# Patient Record
Sex: Male | Born: 1935 | Race: White | Hispanic: No | Marital: Married | Smoking: Former smoker
Health system: Southern US, Community
[De-identification: ages and names within clinical notes are randomized; demographics above are authoritative.]

## PROBLEM LIST (undated history)

## (undated) DIAGNOSIS — I4892 Unspecified atrial flutter: Secondary | ICD-10-CM

## (undated) DIAGNOSIS — F32A Depression, unspecified: Secondary | ICD-10-CM

## (undated) DIAGNOSIS — G250 Essential tremor: Secondary | ICD-10-CM

## (undated) DIAGNOSIS — I2510 Atherosclerotic heart disease of native coronary artery without angina pectoris: Secondary | ICD-10-CM

## (undated) DIAGNOSIS — F329 Major depressive disorder, single episode, unspecified: Secondary | ICD-10-CM

## (undated) DIAGNOSIS — R609 Edema, unspecified: Secondary | ICD-10-CM

## (undated) DIAGNOSIS — I10 Essential (primary) hypertension: Secondary | ICD-10-CM

## (undated) DIAGNOSIS — N400 Enlarged prostate without lower urinary tract symptoms: Secondary | ICD-10-CM

## (undated) DIAGNOSIS — H9193 Unspecified hearing loss, bilateral: Secondary | ICD-10-CM

## (undated) DIAGNOSIS — Z960 Presence of urogenital implants: Secondary | ICD-10-CM

## (undated) DIAGNOSIS — IMO0001 Reserved for inherently not codable concepts without codable children: Secondary | ICD-10-CM

## (undated) DIAGNOSIS — E785 Hyperlipidemia, unspecified: Secondary | ICD-10-CM

## (undated) DIAGNOSIS — I8390 Asymptomatic varicose veins of unspecified lower extremity: Secondary | ICD-10-CM

## (undated) DIAGNOSIS — R55 Syncope and collapse: Secondary | ICD-10-CM

## (undated) DIAGNOSIS — K219 Gastro-esophageal reflux disease without esophagitis: Secondary | ICD-10-CM

## (undated) DIAGNOSIS — I509 Heart failure, unspecified: Secondary | ICD-10-CM

## (undated) DIAGNOSIS — R339 Retention of urine, unspecified: Secondary | ICD-10-CM

## (undated) DIAGNOSIS — Z7901 Long term (current) use of anticoagulants: Secondary | ICD-10-CM

## (undated) DIAGNOSIS — G629 Polyneuropathy, unspecified: Secondary | ICD-10-CM

## (undated) DIAGNOSIS — I350 Nonrheumatic aortic (valve) stenosis: Secondary | ICD-10-CM

## (undated) DIAGNOSIS — H547 Unspecified visual loss: Secondary | ICD-10-CM

## (undated) HISTORY — DX: Heart failure, unspecified: I50.9

## (undated) HISTORY — DX: Gastro-esophageal reflux disease without esophagitis: K21.9

## (undated) HISTORY — DX: Edema, unspecified: R60.9

## (undated) HISTORY — PX: LUNG SURGERY: SHX703

## (undated) HISTORY — DX: Reserved for inherently not codable concepts without codable children: IMO0001

## (undated) HISTORY — DX: Polyneuropathy, unspecified: G62.9

## (undated) HISTORY — DX: Asymptomatic varicose veins of unspecified lower extremity: I83.90

## (undated) HISTORY — DX: Unspecified visual loss: H54.7

## (undated) HISTORY — DX: Long term (current) use of anticoagulants: Z79.01

## (undated) HISTORY — DX: Benign prostatic hyperplasia without lower urinary tract symptoms: N40.0

## (undated) HISTORY — DX: Essential (primary) hypertension: I10

## (undated) HISTORY — DX: Unspecified atrial flutter: I48.92

## (undated) HISTORY — PX: CATARACT EXTRACTION: SUR2

## (undated) HISTORY — PX: UPPER GASTROINTESTINAL ENDOSCOPY: SHX188

## (undated) HISTORY — DX: Essential tremor: G25.0

## (undated) HISTORY — DX: Nonrheumatic aortic (valve) stenosis: I35.0

## (undated) HISTORY — PX: EYE SURGERY: SHX253

## (undated) HISTORY — PX: TUMOR EXCISION: SHX421

## (undated) HISTORY — DX: Atherosclerotic heart disease of native coronary artery without angina pectoris: I25.10

## (undated) HISTORY — DX: Hyperlipidemia, unspecified: E78.5

## (undated) HISTORY — DX: Unspecified hearing loss, bilateral: H91.93

---

## 1998-10-21 ENCOUNTER — Encounter: Admission: RE | Admit: 1998-10-21 | Discharge: 1998-10-21 | Payer: Self-pay | Admitting: Family Medicine

## 2000-05-20 ENCOUNTER — Ambulatory Visit (HOSPITAL_COMMUNITY): Admission: RE | Admit: 2000-05-20 | Discharge: 2000-05-20 | Payer: Self-pay | Admitting: Gastroenterology

## 2003-05-31 ENCOUNTER — Encounter: Admission: RE | Admit: 2003-05-31 | Discharge: 2003-05-31 | Payer: Self-pay | Admitting: Family Medicine

## 2005-02-20 ENCOUNTER — Ambulatory Visit: Payer: Self-pay | Admitting: *Deleted

## 2005-02-21 ENCOUNTER — Ambulatory Visit: Payer: Self-pay | Admitting: *Deleted

## 2005-02-22 ENCOUNTER — Inpatient Hospital Stay (HOSPITAL_BASED_OUTPATIENT_CLINIC_OR_DEPARTMENT_OTHER): Admission: RE | Admit: 2005-02-22 | Discharge: 2005-02-22 | Payer: Self-pay | Admitting: Cardiology

## 2005-02-22 ENCOUNTER — Ambulatory Visit: Payer: Self-pay | Admitting: *Deleted

## 2005-02-22 ENCOUNTER — Emergency Department (HOSPITAL_COMMUNITY): Admission: EM | Admit: 2005-02-22 | Discharge: 2005-02-23 | Payer: Self-pay | Admitting: Emergency Medicine

## 2005-02-23 ENCOUNTER — Ambulatory Visit: Payer: Self-pay | Admitting: Cardiovascular Disease

## 2005-03-13 ENCOUNTER — Ambulatory Visit: Payer: Self-pay | Admitting: *Deleted

## 2005-03-13 ENCOUNTER — Ambulatory Visit: Payer: Self-pay

## 2005-05-21 ENCOUNTER — Ambulatory Visit: Payer: Self-pay

## 2005-08-16 ENCOUNTER — Ambulatory Visit: Payer: Self-pay | Admitting: Cardiology

## 2005-08-22 ENCOUNTER — Ambulatory Visit: Payer: Self-pay

## 2005-08-22 ENCOUNTER — Encounter: Payer: Self-pay | Admitting: Internal Medicine

## 2005-08-22 ENCOUNTER — Ambulatory Visit: Payer: Self-pay | Admitting: *Deleted

## 2006-07-22 ENCOUNTER — Ambulatory Visit: Payer: Self-pay

## 2006-07-22 ENCOUNTER — Encounter (INDEPENDENT_AMBULATORY_CARE_PROVIDER_SITE_OTHER): Payer: Self-pay | Admitting: Family Medicine

## 2006-12-25 ENCOUNTER — Ambulatory Visit: Payer: Self-pay | Admitting: Cardiology

## 2007-11-25 ENCOUNTER — Encounter: Admission: RE | Admit: 2007-11-25 | Discharge: 2007-11-25 | Payer: Self-pay | Admitting: Family Medicine

## 2009-05-31 ENCOUNTER — Telehealth (INDEPENDENT_AMBULATORY_CARE_PROVIDER_SITE_OTHER): Payer: Self-pay | Admitting: *Deleted

## 2010-02-07 NOTE — Progress Notes (Signed)
  Phone Note From Other Clinic   Caller: Dr. Judie Petit. [facility] Summary of Call: Called to request all colonoscopy reports on patient.  I requested a signed release form.  It was received and records were faxed from paper chart.  Release form stamped, numbered and sent to scanning.

## 2010-04-25 ENCOUNTER — Other Ambulatory Visit: Payer: Self-pay | Admitting: Neurology

## 2010-04-25 DIAGNOSIS — G629 Polyneuropathy, unspecified: Secondary | ICD-10-CM

## 2010-04-25 DIAGNOSIS — R292 Abnormal reflex: Secondary | ICD-10-CM

## 2010-04-26 ENCOUNTER — Other Ambulatory Visit: Payer: Self-pay | Admitting: Neurology

## 2010-04-26 DIAGNOSIS — Z139 Encounter for screening, unspecified: Secondary | ICD-10-CM

## 2010-04-27 ENCOUNTER — Ambulatory Visit
Admission: RE | Admit: 2010-04-27 | Discharge: 2010-04-27 | Disposition: A | Discharge status: Home / Self Care | Payer: Medicare Other | Source: Ambulatory Visit | Attending: Neurology | Admitting: Neurology

## 2010-04-27 DIAGNOSIS — R292 Abnormal reflex: Secondary | ICD-10-CM

## 2010-04-27 DIAGNOSIS — Z139 Encounter for screening, unspecified: Secondary | ICD-10-CM

## 2010-04-27 DIAGNOSIS — G629 Polyneuropathy, unspecified: Secondary | ICD-10-CM

## 2010-05-26 NOTE — Cardiovascular Report (Signed)
NAME:  Keith Ellis, Keith Ellis NO.:  [id number]   MEDICAL RECORD NO.:  [id number]          PATIENT TYPE:  OIB   LOCATION:  1963                         FACILITY:  [facility]   PHYSICIAN:  Vida Roller, M.D.   DATE OF BIRTH:  Jan 20, 1929   DATE OF PROCEDURE:  02/22/2005  DATE OF DISCHARGE:                              CARDIAC CATHETERIZATION   PRIMARY:  Dr. Mosetta Putt   CARDIOLOGIST:  Dr. Glennon Keith Ellis   This is a man with chest discomfort, congestive heart failure, and mild  aortic stenosis who presents with for evaluation for coronary artery  disease.   PROCEDURES PERFORMED:  1.  Left heart catheterization.  2.  Selective coronary angiography.   DETAILS OF THE PROCEDURE:  After obtaining informed consent the patient was  brought to the cardiac catheterization laboratory in the fasting state.  There he was prepped and draped in the usual sterile manner and local  anesthetic was obtained over the right groin using 1% lidocaine without  epinephrine.  Right femoral artery was cannulated using the modified  Seldinger technique with a 4-French 10 cm sheath and left heart  catheterization was performed using a 6-French Judkins left #4 and a 6-  French Judkins right #4.  Hand injection angiography of the right femoral  artery was performed with the right Judkins catheter.  The degree of femoral  artery disease did not allow for successful positioning of a pigtail  catheter into the left ventricle and so left ventriculography was not  performed.  At the conclusion of the procedure the catheters were removed.  The patient was moved back to the cardiology holding area.  The femoral  artery sheath was removed.  Hemostasis was obtained using direct manual  pressure.  At the conclusion of the hold there was no evidence of ecchymosis  or hematoma formation and distal pulses were intact.  Total fluoroscopic  time was 10.7 minutes.  Total ionized contrast used was 100 mL.  Fluoroscopic time was 10.7 minutes.   RESULTS:  The femoral artery is mildly diseased, but has a 360 degree turn  prior to its insertion into the distal aorta.  We were able to successfully  traverse this with a wooly wire; however, it made positioning the catheters  tremendously difficult.   CORONARY ANGIOGRAPHY:  The left main coronary artery is a moderate caliber  vessel which has some luminal irregularities and a 25% ostial stenosis.   The left anterior descending coronary artery is a moderate caliber vessel  with two diagonal branches.  It is free of disease.   The left circumflex coronary artery is a small vessel which essentially only  has one obtuse marginal and it has luminal irregularities in its distal  portion, but no obstructive disease.   The right coronary artery is a very large dominant vessel.  It has four  terminal branches, three large posterior lateral branches, and a very large  posterior descending branch and it is free of disease.   ASSESSMENT:  1.  Non-obstructive coronary disease.  2.  Mild peripheral vascular disease.  Aortic pressure was measured  at      138/55 with a mean of 85.   PLAN:  Medical management.  The patient had an echocardiogram which showed  preserved LV systolic function.      Vida Roller, M.D.  Electronically Signed     JH/MEDQ  D:  02/22/2005  T:  02/22/2005  Job:  [id number]   cc:   Mosetta Putt, M.D.  Fax: [phone]   E. Graceann Congress, M.D.  [address]  [address]  [place]  [place] [address]

## 2010-05-26 NOTE — Letter (Signed)
August 22, 2005     Mosetta Putt, MD  [address] [place], [place] [address]   RE:  GAMBLE, ENDERLE  MRN:  [id number]  /  DOB:  04/02/1929   Dear Dr. Duaine Dredge:   It was a pleasure to see this patient, Keith Ellis, for follow-up on August 22, 2005.  As you know, he is a pleasant 77 year old gentleman who was  initially seen in February with typical angina, history of congestive heart  failure, normal left ventricle, mild aortic sclerosis, right bundle branch  block.  Coronary angiography at that time revealed nonobstructive coronary  artery disease.  Patient apparently has low LDL and does not desire Statin  therapy at this time.  He has had some peripheral edema and was seen by Dr.  Dorethea Clan last week.   A BNP at that time was 268, renal profile was normal, liver function tests  were normal.  A 2D echocardiogram was repeated revealing an ejection  fraction of 65%, some aortic sclerosis but no stenosis, the aortic valve  moved quite well.   MEDICATIONS:  1. Flomax 0.8.  2. Prilosec 20.  3. Hydrochlorothiazide 25.  4. Fish oil.  5. Enalapril 5.  6. Finasteride 5.  7. Aspirin 81.   PHYSICAL EXAMINATION:  VITAL SIGNS:  Blood pressure 124/58, pulse 51, sinus  bradycardia.  GENERAL APPEARANCE:  Normal.  NECK:  Jugular venous distention is not elevated.  Carotid pulses palpable  without bruits.  LUNGS:  Clear.  CARDIAC:  I/XI short systolic ejection murmur aortic area, no diastolic  murmur.  ABDOMEN:  Unremarkable.  EXTREMITIES:  Reveal 1+ edema with venous insufficiency.   EKG shows sinus bradycardia, right bundle branch block.   DIAGNOSIS:  As above.   We plan to continue on the same therapy.  He is leaving tomorrow for a road  trip to [place].  We will see him back in 3 months or p.r.n.  Thank you for  the opportunity to share this nice gentleman's care.    Sincerely,      E. Graceann Congress, MD, [facility]   EJL/MedQ  DD:  08/22/2005  DT:  08/22/2005  Job #:   [id number]   CC:    Mosetta Putt, MD

## 2010-05-26 NOTE — Assessment & Plan Note (Signed)
[facility]                              CARDIOLOGY OFFICE NOTE   Keith Ellis, Keith Ellis                       MRN:          [id number]  DATE:08/16/2005                            DOB:          10/24/1929    REASON FOR VISIT:  Follow up medication adjustments with history of edema  and congestive heart failure.   HISTORY OF PRESENT ILLNESS:  Mr. Lindblad is a pleasant gentleman typically  followed by Dr. Corinda Gubler and last seen in the office back in March.  He has a  history of nonobstructive coronary artery disease documented at  catheterization in February of this year with overall preserved ejection  fraction.  In reviewing the chart, he has had some problems with  intermittent increase in lower extremity edema as well as increasing  abdominal girth and increase in weight.  This has been managed with low dose  diuretic therapy, specifically hydrochlorothiazide.  My understanding is  that he has also some abnormal blood pressure measurements recently.  He has  a home digital cuff that he uses and he was at the beach recently with his  wife noting what sounds to be low diastolic blood pressures predominantly,  although also elevated systolic blood pressures.  Medications have been  adjusted including a decrease in his ACE inhibitor dose with continuation of  hydrochlorothiazide at 25 mg daily.  Lower extremity edema and abdominal  girth have improved gradually over a period of weeks and actually Mr. Volkman  states that he is feeling fairly well at this point.  He did have a recent  chest x-ray showing cardiomegaly, but no marked pulmonary edema.  He is not  reporting any significant chest pain or orthopnea.  A 12 lead  electrocardiogram from August 15, 2005 shows an ectopic atrial rhythm with  right bundle branch block pattern.  This is not markedly changed compared to  his prior tracing from February 2007.  I am not aware of any history of  liver disease  and see normal liver function tests from March of 2007 as well  as normal renal function at that time with a BUN and creatinine of 22 and  1.0.  Also noted is recent evaluation in terms of his vascular status  including an abdominal ultrasound revealing normal caliber abdominal aorta  and proximal common iliac arteries with mild aortoiliac atherosclerosis and  flow reduction.  Lower extremity studies did not reveal any significant  occlusive disease bilaterally with normal ABI's and there was no evidence of  a pseudoaneurysm following catheterization on the right.  I reviewed the  patient's medical regimen today with him.   ALLERGIES:  NO KNOWN DRUG ALLERGIES.   PRESENT MEDICATIONS:  1. Flomax 0.8 mg p.o. daily.  2. Prilosec 20 mg p.o. q.o.d.  3. Hydrochlorothiazide 25 mg p.o. daily.  4. Fish oil supplements 1000 mg p.o. daily.  5. Enalapril 5 mg p.o. daily.  6. Finasteride 5 mg p.o. daily.  7. He is not taking his aspirin.   REVIEW OF SYSTEMS:  As described in history of present illness.  All  else  negative.   PHYSICAL EXAMINATION:  Blood pressure today is quite good at 124/60, heart  rate 65, weight 108 pounds which is relatively stable of late.  He is  comfortable and in no acute distress.  Examination of the neck shows no  obvious jugular venous distention or loud bruits.  Lungs are clear without  rales or wheezing.  Cardiac examination reveals a regular rate and rhythm  without S3 gallop.  There is a 2/6 systolic ejection murmur heard at the  base.  PMI is not displaced, but indistinct.  The abdomen is somewhat  protuberant with normal bowel sounds.  Extremities exhibit chronic appearing  edema with venous stasis changes.  There is approximately 1+ pitting at this  point.   IMPRESSION AND RECOMMENDATIONS:  1. Intermittent lower extremity edema with increased abdominal girth.      This is in the setting of previously documented normal left ventricular      systolic function.   He also has hypertension and the possibility of      diastolic dysfunction with relative volume overload is certainly a      consideration, although I wonder about his right ventricular function      as well.  Presently he is hemodynamically stable with good blood      pressure and symptomatically he feels as if he is doing much better.      Therefore, I am reluctant to make any dramatic changes in his medicines      at this point, although I think more objective information would be in      order.  I will therefore plan a follow up CMET and BNP as well as a      repeat echocardiogram to assess both right and left ventricular      function.  He will then have a follow up in the office with Dr. Corinda Gubler      next week prior to a planned visit out of town.  It may be that his ACE      inhibitor therapy could be up titrated again if his blood pressure      begins to creep up, although I will continue the present dose at this      time.  He has not used Lasix per report and this will be consideration      as well if he needed more intense diuresis.  I also recommended that he      start taking a coated baby aspirin once again for general cardiac risk      reduction particularly with his documented disease.  2. Further plans to follow.                                Jonelle Sidle, MD    SGM/MedQ  DD:  08/16/2005  DT:  08/16/2005  Job #:  [id number]   cc:   Cecil Cranker, MD, [facility]  Mosetta Putt, MD

## 2010-07-19 ENCOUNTER — Encounter: Payer: Self-pay | Admitting: Cardiology

## 2010-07-20 ENCOUNTER — Ambulatory Visit: Payer: Medicare Other | Admitting: Cardiology

## 2010-07-20 ENCOUNTER — Encounter: Payer: Self-pay | Admitting: Cardiology

## 2010-07-20 ENCOUNTER — Ambulatory Visit (INDEPENDENT_AMBULATORY_CARE_PROVIDER_SITE_OTHER): Payer: Medicare Other | Admitting: Cardiology

## 2010-07-20 VITALS — BP 144/50 | HR 61 | Ht 67.0 in | Wt 209.0 lb

## 2010-07-20 DIAGNOSIS — I359 Nonrheumatic aortic valve disorder, unspecified: Secondary | ICD-10-CM

## 2010-07-20 DIAGNOSIS — IMO0001 Reserved for inherently not codable concepts without codable children: Secondary | ICD-10-CM

## 2010-07-20 DIAGNOSIS — I10 Essential (primary) hypertension: Secondary | ICD-10-CM

## 2010-07-20 DIAGNOSIS — I350 Nonrheumatic aortic (valve) stenosis: Secondary | ICD-10-CM

## 2010-07-20 DIAGNOSIS — I2510 Atherosclerotic heart disease of native coronary artery without angina pectoris: Secondary | ICD-10-CM

## 2010-07-20 DIAGNOSIS — I700 Atherosclerosis of aorta: Secondary | ICD-10-CM

## 2010-07-20 DIAGNOSIS — R9431 Abnormal electrocardiogram [ECG] [EKG]: Secondary | ICD-10-CM

## 2010-07-20 DIAGNOSIS — E669 Obesity, unspecified: Secondary | ICD-10-CM

## 2010-07-20 NOTE — Assessment & Plan Note (Signed)
I suspect that the patient's aortic stenosis is at least moderate by exam. I discussed this and went over the anatomy carefully with the patient and his wife. I will order an echocardiogram.

## 2010-07-20 NOTE — Assessment & Plan Note (Signed)
His blood pressure is not quite at target. First I would suggest weight loss and salt restriction we will need to keep a blood pressure diary.

## 2010-07-20 NOTE — Assessment & Plan Note (Signed)
The patient understands the need to lose weight with diet and exercise. We have discussed specific strategies for this.  

## 2010-07-20 NOTE — Assessment & Plan Note (Signed)
Note he does have conduction disturbances or ectopy he has not had documented atrial fibrillation. No further evaluation is warranted.

## 2010-07-20 NOTE — Assessment & Plan Note (Signed)
This was very minimal 5 years ago and. He should continue with risk reduction.

## 2010-07-20 NOTE — Progress Notes (Signed)
HPI The patient presents for followup of a heart murmur. He was also thought possibly to have atrial fibrillation on EKG done recently. I reviewed this he had atrial ectopy but sinus rhythm. There was no evidence of atrial fibrillation though the EKG was somewhat irregular. He does have a harsh heart murmur. I reviewed some hospital records and identified in echocardiogram in 2008 that demonstrated aortic valve sclerosis without stenosis. He also had catheterization in 2007 demonstrating an LAD stenosis of 25%.  The patient does not report any recent cardiovascular symptoms. He can push a lawnmower without bringing on any discomfort. He denies any shortness of breath, PND or orthopnea. He denies any palpitations, presyncope or syncope. He has no chest pressure, neck or arm discomfort. He does have chronic lower extremity swelling but no significant weight gain.  No Known Allergies  Current Outpatient Prescriptions  Medication Sig Dispense Refill  . Bilberry, Vaccinium myrtillus, 100 MG CAPS Take 1 capsule by mouth daily.        Marland Kitchen Cod Liver Oil 1000 MG CAPS Take 1 capsule by mouth daily.        Marland Kitchen doxazosin (CARDURA) 2 MG tablet Take 2 mg by mouth at bedtime.        . Garlic 2000 MG CAPS Take 1 capsule by mouth daily.        Marland Kitchen L-Tryptophan 500 MG CAPS Take 1 capsule by mouth daily.        . naproxen sodium (ANAPROX) 220 MG tablet Take 220 mg by mouth 2 (two) times daily with a meal.        . omeprazole (PRILOSEC) 20 MG capsule Take 20 mg by mouth daily.        Marland Kitchen torsemide (DEMADEX) 20 MG tablet Take 20 mg by mouth daily.          Past Medical History  Diagnosis Date  . GERD (gastroesophageal reflux disease)   . Hypertension   . Blind     Right enucleation  . Hearing loss   . Coronary artery disease     Minimal LAD stenosis 2007  . BPH (benign prostatic hypertrophy)   . Hyperlipidemia   . Varicose vein   . Peripheral neuropathy   . Tremor, essential   . Aortic sclerosis     2008     Past Surgical History  Procedure Date  . Eye surgery   . Lung surgery     Benign lump removed    Family History  Problem Relation Age of Onset  . Cancer Father     Rectal  . Cancer Mother     Breast    History   Social History  . Marital Status: Married    Spouse Name: N/A    Number of Children: 3  . Years of Education: N/A   Occupational History  . [employer]    Social History Main Topics  . Smoking status: Former Smoker    Types: Cigarettes    Quit date: 07/20/1975  . Smokeless tobacco: Never Used  . Alcohol Use: No  . Drug Use: No  . Sexually Active: Not on file   Other Topics Concern  . Not on file   Social History Narrative  . No narrative on file    ROS:  Positive for constipation, reflux, difficulty swallowing, urinary frequency..  Otherwise as stated in the HPI and negative for all other systems.  PHYSICAL EXAM BP 144/50  Pulse 61  Ht 5\' 7"  (1.702 m)  Wt  209 lb (94.802 kg)  BMI 32.73 kg/m2 GENERAL:  Well appearing HEENT:  Pupils equal round and reactive, fundi not visualized, oral mucosa unremarkable NECK:  No jugular venous distention, waveform within normal limits, carotid upstroke brisk and symmetric, no bruits, no thyromegaly LYMPHATICS:  No cervical, inguinal adenopathy LUNGS:  Clear to auscultation bilaterally BACK:  No CVA tenderness CHEST:  Unremarkable HEART:  PMI not displaced or sustained,S1 and S2 within normal limits, no S3, no S4, no clicks, no rubs, harsh systolic murmur radiating out the outflow tract and mid to late peaking ABD:  Flat, positive bowel sounds normal in frequency in pitch, no bruits, no rebound, no guarding, no midline pulsatile mass, no hepatomegaly, no splenomegaly, obese EXT:  2 plus pulses throughout, bilateral moderate edema, no cyanosis no clubbing, venous stasis changes SKIN:  No rashes no nodules NEURO:  Cranial nerves II through XII grossly intact, motor grossly intact throughout PSYCH:   Cognitively intact, oriented to person place and time   EKG:  Sinus bradycardia, right bundle branch block, first degree AV block, premature ventricular contractions  ASSESSMENT AND PLAN

## 2010-07-20 NOTE — Patient Instructions (Addendum)
Your physician has requested that you have an echocardiogram. Echocardiography is a painless test that uses sound waves to create images of your heart. It provides your doctor with information about the size and shape of your heart and how well your heart's chambers and valves are working. This procedure takes approximately one hour. There are no restrictions for this procedure.  Please continue your medications as listed.  Follow up with Dr Antoine Poche in 6 months.

## 2010-07-27 ENCOUNTER — Ambulatory Visit (HOSPITAL_COMMUNITY): Payer: Medicare Other | Attending: Cardiology | Admitting: Radiology

## 2010-07-27 DIAGNOSIS — I080 Rheumatic disorders of both mitral and aortic valves: Secondary | ICD-10-CM | POA: Insufficient documentation

## 2010-07-27 DIAGNOSIS — I379 Nonrheumatic pulmonary valve disorder, unspecified: Secondary | ICD-10-CM | POA: Insufficient documentation

## 2010-07-27 DIAGNOSIS — I2510 Atherosclerotic heart disease of native coronary artery without angina pectoris: Secondary | ICD-10-CM | POA: Insufficient documentation

## 2010-07-27 DIAGNOSIS — I350 Nonrheumatic aortic (valve) stenosis: Secondary | ICD-10-CM

## 2010-07-27 DIAGNOSIS — I4891 Unspecified atrial fibrillation: Secondary | ICD-10-CM

## 2010-07-27 DIAGNOSIS — I079 Rheumatic tricuspid valve disease, unspecified: Secondary | ICD-10-CM | POA: Insufficient documentation

## 2010-07-27 DIAGNOSIS — I10 Essential (primary) hypertension: Secondary | ICD-10-CM | POA: Insufficient documentation

## 2010-07-27 DIAGNOSIS — R011 Cardiac murmur, unspecified: Secondary | ICD-10-CM

## 2010-08-02 ENCOUNTER — Telehealth: Payer: Self-pay | Admitting: Cardiology

## 2010-08-02 NOTE — Telephone Encounter (Signed)
Test result

## 2010-08-02 NOTE — Telephone Encounter (Signed)
Pt aware of results of Echo.

## 2010-08-10 ENCOUNTER — Encounter: Payer: Self-pay | Admitting: Cardiology

## 2010-08-16 ENCOUNTER — Telehealth: Payer: Self-pay | Admitting: Cardiology

## 2010-08-16 NOTE — Telephone Encounter (Signed)
Test results

## 2010-08-16 NOTE — Telephone Encounter (Signed)
Patient aware of echo results.

## 2010-12-21 ENCOUNTER — Ambulatory Visit (INDEPENDENT_AMBULATORY_CARE_PROVIDER_SITE_OTHER): Payer: Medicare Other | Admitting: Family Medicine

## 2010-12-21 DIAGNOSIS — R609 Edema, unspecified: Secondary | ICD-10-CM

## 2010-12-21 DIAGNOSIS — R21 Rash and other nonspecific skin eruption: Secondary | ICD-10-CM

## 2010-12-21 DIAGNOSIS — I509 Heart failure, unspecified: Secondary | ICD-10-CM

## 2010-12-27 ENCOUNTER — Ambulatory Visit (INDEPENDENT_AMBULATORY_CARE_PROVIDER_SITE_OTHER): Payer: Medicare Other

## 2010-12-27 DIAGNOSIS — J158 Pneumonia due to other specified bacteria: Secondary | ICD-10-CM

## 2010-12-27 DIAGNOSIS — C44319 Basal cell carcinoma of skin of other parts of face: Secondary | ICD-10-CM

## 2010-12-31 ENCOUNTER — Emergency Department (HOSPITAL_COMMUNITY): Payer: Medicare Other

## 2010-12-31 ENCOUNTER — Encounter (HOSPITAL_COMMUNITY): Payer: Self-pay | Admitting: Cardiology

## 2010-12-31 ENCOUNTER — Other Ambulatory Visit: Payer: Self-pay

## 2010-12-31 ENCOUNTER — Emergency Department (HOSPITAL_COMMUNITY)
Admission: EM | Admit: 2010-12-31 | Discharge: 2010-12-31 | Disposition: A | Discharge status: Home / Self Care | Payer: Medicare Other | Attending: Emergency Medicine | Admitting: Emergency Medicine

## 2010-12-31 DIAGNOSIS — Z79899 Other long term (current) drug therapy: Secondary | ICD-10-CM | POA: Insufficient documentation

## 2010-12-31 DIAGNOSIS — I4510 Unspecified right bundle-branch block: Secondary | ICD-10-CM | POA: Insufficient documentation

## 2010-12-31 DIAGNOSIS — J9801 Acute bronchospasm: Secondary | ICD-10-CM | POA: Insufficient documentation

## 2010-12-31 DIAGNOSIS — H9190 Unspecified hearing loss, unspecified ear: Secondary | ICD-10-CM | POA: Insufficient documentation

## 2010-12-31 DIAGNOSIS — R0989 Other specified symptoms and signs involving the circulatory and respiratory systems: Secondary | ICD-10-CM | POA: Insufficient documentation

## 2010-12-31 DIAGNOSIS — K219 Gastro-esophageal reflux disease without esophagitis: Secondary | ICD-10-CM | POA: Insufficient documentation

## 2010-12-31 DIAGNOSIS — R0609 Other forms of dyspnea: Secondary | ICD-10-CM | POA: Insufficient documentation

## 2010-12-31 DIAGNOSIS — Z87891 Personal history of nicotine dependence: Secondary | ICD-10-CM | POA: Insufficient documentation

## 2010-12-31 DIAGNOSIS — I2510 Atherosclerotic heart disease of native coronary artery without angina pectoris: Secondary | ICD-10-CM | POA: Insufficient documentation

## 2010-12-31 DIAGNOSIS — R0600 Dyspnea, unspecified: Secondary | ICD-10-CM

## 2010-12-31 LAB — CBC
HCT: 41 % (ref 39.0–52.0)
MCH: 29.8 pg (ref 26.0–34.0)
MCHC: 34.6 g/dL (ref 30.0–36.0)
Platelets: 148 10*3/uL — ABNORMAL LOW (ref 150–400)
RBC: 4.76 MIL/uL (ref 4.22–5.81)
RDW: 13.7 % (ref 11.5–15.5)

## 2010-12-31 LAB — BASIC METABOLIC PANEL
BUN: 19 mg/dL (ref 6–23)
CO2: 25 mEq/L (ref 19–32)
Chloride: 103 mEq/L (ref 96–112)
GFR calc Af Amer: 65 mL/min — ABNORMAL LOW (ref >90)
GFR calc non Af Amer: 56 mL/min — ABNORMAL LOW (ref >90)

## 2010-12-31 LAB — TROPONIN I: Troponin I: <0.3 ng/mL (ref <0.30)

## 2010-12-31 MED ORDER — ALBUTEROL SULFATE HFA 108 (90 BASE) MCG/ACT IN AERS
2.0000 | INHALATION_SPRAY | RESPIRATORY_TRACT | Status: DC
  Filled 2010-12-31: qty 6.7

## 2010-12-31 MED ORDER — IPRATROPIUM BROMIDE 0.02 % IN SOLN
0.5000 mg | Freq: Once | RESPIRATORY_TRACT | Status: AC
  Administered 2010-12-31: 0.5 mg via RESPIRATORY_TRACT
  Filled 2010-12-31: qty 2.5

## 2010-12-31 MED ORDER — LORAZEPAM 1 MG PO TABS
1.0000 mg | ORAL_TABLET | Freq: Once | ORAL | Status: AC
  Administered 2010-12-31: 1 mg via ORAL
  Filled 2010-12-31: qty 1

## 2010-12-31 MED ORDER — ALBUTEROL SULFATE (5 MG/ML) 0.5% IN NEBU
5.0000 mg | INHALATION_SOLUTION | Freq: Once | RESPIRATORY_TRACT | Status: AC
  Administered 2010-12-31: 5 mg via RESPIRATORY_TRACT
  Filled 2010-12-31: qty 1

## 2010-12-31 MED ORDER — AEROCHAMBER PLUS W/MASK MISC
Status: AC
  Filled 2010-12-31: qty 1
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
## 2010-12-31 NOTE — ED Notes (Signed)
Pt to department from home- pt reports that he was dx with flu and PNA and given antibiotics. Reports that he is still feeling SOB today and is finishing up his antibiotics. Bp- 190/78 Hr- 60.

## 2011-01-04 ENCOUNTER — Ambulatory Visit (INDEPENDENT_AMBULATORY_CARE_PROVIDER_SITE_OTHER): Payer: Medicare Other

## 2011-01-04 DIAGNOSIS — F411 Generalized anxiety disorder: Secondary | ICD-10-CM

## 2011-01-04 DIAGNOSIS — R05 Cough: Secondary | ICD-10-CM

## 2011-02-08 ENCOUNTER — Encounter: Payer: Self-pay | Admitting: Family Medicine

## 2011-02-08 ENCOUNTER — Ambulatory Visit (INDEPENDENT_AMBULATORY_CARE_PROVIDER_SITE_OTHER): Payer: Medicare Other | Admitting: Family Medicine

## 2011-02-08 VITALS — BP 141/43 | HR 50 | Temp 97.5°F | Resp 16 | Ht 66.0 in | Wt 200.0 lb

## 2011-02-08 DIAGNOSIS — R4589 Other symptoms and signs involving emotional state: Secondary | ICD-10-CM

## 2011-02-08 DIAGNOSIS — K219 Gastro-esophageal reflux disease without esophagitis: Secondary | ICD-10-CM

## 2011-02-08 DIAGNOSIS — H9209 Otalgia, unspecified ear: Secondary | ICD-10-CM

## 2011-02-08 DIAGNOSIS — H9202 Otalgia, left ear: Secondary | ICD-10-CM

## 2011-02-08 DIAGNOSIS — N400 Enlarged prostate without lower urinary tract symptoms: Secondary | ICD-10-CM

## 2011-02-08 DIAGNOSIS — E785 Hyperlipidemia, unspecified: Secondary | ICD-10-CM

## 2011-02-08 DIAGNOSIS — D2330 Other benign neoplasm of skin of unspecified part of face: Secondary | ICD-10-CM

## 2011-02-08 DIAGNOSIS — F411 Generalized anxiety disorder: Secondary | ICD-10-CM

## 2011-02-08 DIAGNOSIS — H5440 Blindness, one eye, unspecified eye: Secondary | ICD-10-CM

## 2011-02-08 DIAGNOSIS — G629 Polyneuropathy, unspecified: Secondary | ICD-10-CM | POA: Insufficient documentation

## 2011-02-08 DIAGNOSIS — H9190 Unspecified hearing loss, unspecified ear: Secondary | ICD-10-CM

## 2011-02-08 DIAGNOSIS — R9431 Abnormal electrocardiogram [ECG] [EKG]: Secondary | ICD-10-CM

## 2011-02-08 DIAGNOSIS — G609 Hereditary and idiopathic neuropathy, unspecified: Secondary | ICD-10-CM

## 2011-02-08 MED ORDER — ALPRAZOLAM 0.25 MG PO TABS: 0.2500 mg | ORAL_TABLET | Freq: Two times a day (BID) | ORAL | Status: AC | PRN

## 2011-02-08 MED ORDER — NEOMYCIN-POLYMYXIN-HC 3.5-10000-1 OT SOLN: 3.0000 [drp] | Freq: Three times a day (TID) | OTIC | Status: AC

## 2011-02-08 NOTE — Assessment & Plan Note (Signed)
Atrial fibrillation intermittently, RBBB, h/o chf

## 2011-02-08 NOTE — Patient Instructions (Signed)

## 2011-02-08 NOTE — Progress Notes (Signed)
  Subjective:    Patient ID: Keith Ellis, male    DOB: Aug 01, 1929, 78 y.o.   MRN: [id number]  Rash This is a chronic problem. The current episode started more than 1 year ago. The problem has been gradually improving since onset. The affected locations include the face. The rash is characterized by scaling and redness. He was exposed to nothing. Past treatments include topical steroids. The treatment provided no relief. There is no history of allergies, asthma, eczema or varicella.  Otalgia  Associated symptoms include a rash.  Anxiety   There is no history of asthma.      Review of Systems  HENT: Positive for ear pain.   Skin: Positive for rash.       Objective:   Physical Exam  HENT:  Head: Normocephalic and atraumatic. Head is without laceration.  Right Ear: No lacerations. No drainage, swelling or tenderness. No foreign bodies. No mastoid tenderness. Tympanic membrane is not injected, not scarred, not perforated, not erythematous, not retracted and not bulging. Tympanic membrane mobility is abnormal. A middle ear effusion is present. No hemotympanum. Decreased hearing is noted.  Left Ear: No lacerations. No drainage, swelling or tenderness. No foreign bodies. There is mastoid tenderness. Tympanic membrane is not injected, not scarred, not perforated, not erythematous, not retracted and not bulging. Tympanic membrane mobility is abnormal. A middle ear effusion is present. No hemotympanum. Decreased hearing is noted.  Eyes: Conjunctivae are normal. Pupils are equal, round, and reactive to light.  Neck: Normal range of motion. Neck supple.  Cardiovascular:  Murmur heard. Pulmonary/Chest: Effort normal and breath sounds normal.  Genitourinary: Guaiac stool: Patient has a 1 cm red, indurated area left cheek.  Skin: Rash noted.  Psychiatric: Judgment and thought content normal. His mood appears anxious. His affect is angry. His affect is not blunt, not labile and not inappropriate.  He is agitated. He is not aggressive, is not hyperactive, not slowed, not withdrawn, not actively hallucinating and not combative. Cognition and memory are normal. He exhibits a depressed mood. He is attentive.    Left cheek lesion cryo'd x 15 seconds      Assessment & Plan:  Anxiety secondary to inappropriate neighbor and angry son living at home Skin lesion left cheek, improved after cryotherapy in past Atypical otalgia with hearing loss

## 2011-02-13 ENCOUNTER — Telehealth: Payer: Self-pay | Admitting: Family Medicine

## 2011-02-13 NOTE — Telephone Encounter (Signed)
LMOM TO CB 

## 2011-02-13 NOTE — Telephone Encounter (Signed)
The PSA on the January 29th lab is elevated at 4.79.   Last  Jan. 28, 2012 was 3.15.  This is a small increase and should not worry the patient.  Instead, I would like him to come in to discuss and repeat in 1 month

## 2011-02-14 NOTE — Telephone Encounter (Signed)
March 28th is perfect

## 2011-02-14 NOTE — Telephone Encounter (Signed)
Pt called back and gave him message about PSA from Dr Milus Glazier. Pt has appt scheduled for March 28, and wonders if that is soon enough to re-check. Told pt I would check with Dr L, and call him back if he needs to return sooner. Otherwise, appt will be fine.

## 2011-02-16 NOTE — Telephone Encounter (Signed)
Opened in error

## 2011-02-19 ENCOUNTER — Telehealth: Payer: Self-pay

## 2011-02-19 NOTE — Telephone Encounter (Signed)
I have not yet received any info from [place]

## 2011-02-19 NOTE — Telephone Encounter (Signed)
.  [facility] PT WAS TO [facility] FOR DR KURT TO REVIEW. HAS IT BEEN DONE YET?? ALSO DO THE DR NEED HIM TO COME BACK BEFORE REVIEWING THE RECORDS. PLEASE CALL [phone]

## 2011-02-19 NOTE — Telephone Encounter (Signed)
Records from [place] in chart. Dr Milus Glazier, do you need to see the pt, or have a message about records after reviewing them?

## 2011-02-20 NOTE — Telephone Encounter (Signed)
[facility] have [facility].  All is fine except for the slight elevation in PSA which we will recheck in 6 weeks

## 2011-02-20 NOTE — Telephone Encounter (Signed)
Dr Milus Glazier, there are some records from [place] in pts chart in your box.

## 2011-02-21 NOTE — Telephone Encounter (Signed)
Gave pt information from Dr Milus Glazier, who agreed, and said he already has appt scheduled for the end of March.

## 2011-04-05 ENCOUNTER — Encounter: Payer: Self-pay | Admitting: Family Medicine

## 2011-04-05 ENCOUNTER — Ambulatory Visit (INDEPENDENT_AMBULATORY_CARE_PROVIDER_SITE_OTHER): Payer: Medicare Other | Admitting: Family Medicine

## 2011-04-05 VITALS — BP 146/50 | HR 54 | Temp 97.8°F | Resp 20 | Ht 66.0 in | Wt 203.2 lb

## 2011-04-05 DIAGNOSIS — R972 Elevated prostate specific antigen [PSA]: Secondary | ICD-10-CM

## 2011-04-05 DIAGNOSIS — H16139 Photokeratitis, unspecified eye: Secondary | ICD-10-CM

## 2011-04-05 LAB — PSA: PSA: 3.09 ng/mL (ref <=4.00)

## 2011-04-05 NOTE — Progress Notes (Signed)
78 yo with elevated PSA several months ago.  His father had colon cancer and he has undergone colonoscopies, most recently in 2011.  Also, he had areas on his face treated with liquid nitrogen.  The areas are doing much better but the right cheek is starting to come back.  No cuts when shaving.  O:  NAD Right cheek flat, glossy lesion 4x5 mm lesion.  Left cheek normal.  cryo'd right cheek x 15 secs  A:  Actinic keratosis, PSA  P:  chk PSA rechk skin 3 months

## 2011-04-17 ENCOUNTER — Encounter: Payer: Self-pay | Admitting: Family Medicine

## 2011-05-03 ENCOUNTER — Telehealth: Payer: Self-pay

## 2011-05-03 MED ORDER — OMEPRAZOLE 20 MG PO CPDR: 20.0000 mg | DELAYED_RELEASE_CAPSULE | Freq: Every day | ORAL | Status: DC

## 2011-05-03 NOTE — Telephone Encounter (Signed)
Can we refill this? 

## 2011-05-03 NOTE — Telephone Encounter (Signed)
.  [facility] The patient called stating his acid reflux medication had not been called into his pharmacy.  Please call patient at [phone].

## 2011-05-03 NOTE — Telephone Encounter (Signed)
Done. Sent in

## 2011-05-03 NOTE — Telephone Encounter (Signed)
Pt's wife notified.

## 2011-07-26 ENCOUNTER — Other Ambulatory Visit: Payer: Self-pay | Admitting: *Deleted

## 2011-07-26 MED ORDER — DOXAZOSIN MESYLATE 2 MG PO TABS: 2.0000 mg | ORAL_TABLET | Freq: Every day | ORAL | Status: DC

## 2011-08-27 ENCOUNTER — Other Ambulatory Visit: Payer: Self-pay | Admitting: Physician Assistant

## 2011-09-12 ENCOUNTER — Other Ambulatory Visit: Payer: Self-pay | Admitting: Physician Assistant

## 2011-09-27 ENCOUNTER — Encounter: Payer: Self-pay | Admitting: *Deleted

## 2011-09-27 DIAGNOSIS — G629 Polyneuropathy, unspecified: Secondary | ICD-10-CM

## 2011-10-02 ENCOUNTER — Other Ambulatory Visit: Payer: Self-pay | Admitting: Physician Assistant

## 2011-10-31 ENCOUNTER — Other Ambulatory Visit: Payer: Self-pay | Admitting: Family Medicine

## 2011-10-31 ENCOUNTER — Other Ambulatory Visit: Payer: Self-pay | Admitting: Physician Assistant

## 2011-11-20 ENCOUNTER — Other Ambulatory Visit: Payer: Self-pay | Admitting: Physician Assistant

## 2011-11-20 NOTE — Telephone Encounter (Signed)
Needs office visit. Final notice.  

## 2011-11-27 ENCOUNTER — Ambulatory Visit (INDEPENDENT_AMBULATORY_CARE_PROVIDER_SITE_OTHER): Payer: Medicare Other | Admitting: Family Medicine

## 2011-11-27 ENCOUNTER — Encounter: Payer: Self-pay | Admitting: Family Medicine

## 2011-11-27 VITALS — BP 138/60 | HR 54 | Temp 97.9°F | Resp 16 | Ht 67.0 in | Wt 219.8 lb

## 2011-11-27 DIAGNOSIS — G4700 Insomnia, unspecified: Secondary | ICD-10-CM

## 2011-11-27 DIAGNOSIS — R635 Abnormal weight gain: Secondary | ICD-10-CM

## 2011-11-27 DIAGNOSIS — Q809 Congenital ichthyosis, unspecified: Secondary | ICD-10-CM

## 2011-11-27 DIAGNOSIS — R5381 Other malaise: Secondary | ICD-10-CM

## 2011-11-27 DIAGNOSIS — R5383 Other fatigue: Secondary | ICD-10-CM

## 2011-11-27 DIAGNOSIS — K219 Gastro-esophageal reflux disease without esophagitis: Secondary | ICD-10-CM

## 2011-11-27 DIAGNOSIS — L851 Acquired keratosis [keratoderma] palmaris et plantaris: Secondary | ICD-10-CM

## 2011-11-27 DIAGNOSIS — R609 Edema, unspecified: Secondary | ICD-10-CM

## 2011-11-27 DIAGNOSIS — R600 Localized edema: Secondary | ICD-10-CM

## 2011-11-27 DIAGNOSIS — I10 Essential (primary) hypertension: Secondary | ICD-10-CM

## 2011-11-27 LAB — COMPREHENSIVE METABOLIC PANEL
ALT: 8 U/L (ref 0–53)
AST: 17 U/L (ref 0–37)
Albumin: 4 g/dL (ref 3.5–5.2)
Alkaline Phosphatase: 83 U/L (ref 39–117)
BUN: 26 mg/dL — ABNORMAL HIGH (ref 6–23)
CO2: 27 mEq/L (ref 19–32)
Calcium: 9.2 mg/dL (ref 8.4–10.5)
Chloride: 106 mEq/L (ref 96–112)
Creat: 1.25 mg/dL (ref 0.50–1.35)
Glucose, Bld: 86 mg/dL (ref 70–99)
Potassium: 4.6 mEq/L (ref 3.5–5.3)
Sodium: 140 mEq/L (ref 135–145)
Total Bilirubin: 2.7 mg/dL — ABNORMAL HIGH (ref 0.3–1.2)
Total Protein: 6.4 g/dL (ref 6.0–8.3)

## 2011-11-27 LAB — CBC
HCT: 39.9 % (ref 39.0–52.0)
Hemoglobin: 13.8 g/dL (ref 13.0–17.0)
MCH: 30.6 pg (ref 26.0–34.0)
MCHC: 34.6 g/dL (ref 30.0–36.0)
MCV: 88.5 fL (ref 78.0–100.0)
Platelets: 95 10*3/uL — ABNORMAL LOW (ref 150–400)
RBC: 4.51 MIL/uL (ref 4.22–5.81)
RDW: 14.7 % (ref 11.5–15.5)
WBC: 4 10*3/uL (ref 4.0–10.5)

## 2011-11-27 LAB — TSH: TSH: 3.974 u[IU]/mL (ref 0.350–4.500)

## 2011-11-27 LAB — T4, FREE: Free T4: 1.01 ng/dL (ref 0.80–1.80)

## 2011-11-27 MED ORDER — DOXAZOSIN MESYLATE 2 MG PO TABS: 2.0000 mg | ORAL_TABLET | Freq: Every day | ORAL | Status: DC

## 2011-11-27 MED ORDER — OMEPRAZOLE 20 MG PO CPDR: 20.0000 mg | DELAYED_RELEASE_CAPSULE | Freq: Every day | ORAL | Status: DC

## 2011-11-27 MED ORDER — TRIAMCINOLONE ACETONIDE 0.1 % EX CREA: TOPICAL_CREAM | Freq: Two times a day (BID) | CUTANEOUS | Status: DC

## 2011-11-27 MED ORDER — ALPRAZOLAM 0.25 MG PO TABS: 0.2500 mg | ORAL_TABLET | Freq: Every evening | ORAL | Status: DC | PRN

## 2011-11-27 MED ORDER — TORSEMIDE 20 MG PO TABS: 20.0000 mg | ORAL_TABLET | Freq: Every day | ORAL | Status: DC

## 2011-11-27 NOTE — Progress Notes (Signed)
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    Some sleep problems.  Has purchased Melatonin 3 mg and will start that.  Appetite: good Bowels:  Stays mildly constipated. Voiding:  Okay;  Stream comes out sideways so he uses a funnel  Objective:  NAD Wearing hearing aids Chest:  Clear Heart:  Regular, II/VI systolic murmur best heard at RSB Extremities:  2+ bilateral edema 3/4 of way up shins, pitting Skin: peeling skin (ichthyosis) both lower extremities.  Alert, cheerful.  Assessment:  Pitting edema not well controlled with scaling, fatigue, stable heart murmur  Plan: 1. Ichthyosis  triamcinolone cream (KENALOG) 0.1 %  2. Pedal edema  torsemide (DEMADEX) 20 MG tablet, CBC, Comprehensive metabolic panel  3. GERD (gastroesophageal reflux disease)  omeprazole (PRILOSEC) 20 MG capsule  4. Hypertension  doxazosin (CARDURA) 2 MG tablet  5. Insomnia  ALPRAZolam (XANAX) 0.25 MG tablet  6. Weight gain  TSH, T4, Free  7. Fatigue  TSH, CBC, Comprehensive metabolic panel, T4, Free

## 2011-12-27 ENCOUNTER — Ambulatory Visit (INDEPENDENT_AMBULATORY_CARE_PROVIDER_SITE_OTHER): Payer: Medicare Other | Admitting: Cardiology

## 2011-12-27 ENCOUNTER — Encounter: Payer: Self-pay | Admitting: Cardiology

## 2011-12-27 ENCOUNTER — Ambulatory Visit (INDEPENDENT_AMBULATORY_CARE_PROVIDER_SITE_OTHER): Payer: Medicare Other | Admitting: Family Medicine

## 2011-12-27 ENCOUNTER — Encounter: Payer: Self-pay | Admitting: Family Medicine

## 2011-12-27 VITALS — BP 120/50 | HR 58 | Ht 67.0 in | Wt 221.1 lb

## 2011-12-27 VITALS — BP 128/50 | HR 54 | Temp 97.8°F | Resp 20 | Ht 67.5 in | Wt 220.8 lb

## 2011-12-27 DIAGNOSIS — E785 Hyperlipidemia, unspecified: Secondary | ICD-10-CM

## 2011-12-27 DIAGNOSIS — I700 Atherosclerosis of aorta: Secondary | ICD-10-CM

## 2011-12-27 DIAGNOSIS — N400 Enlarged prostate without lower urinary tract symptoms: Secondary | ICD-10-CM

## 2011-12-27 DIAGNOSIS — R0600 Dyspnea, unspecified: Secondary | ICD-10-CM

## 2011-12-27 DIAGNOSIS — R0989 Other specified symptoms and signs involving the circulatory and respiratory systems: Secondary | ICD-10-CM

## 2011-12-27 DIAGNOSIS — R0609 Other forms of dyspnea: Secondary | ICD-10-CM

## 2011-12-27 DIAGNOSIS — R609 Edema, unspecified: Secondary | ICD-10-CM

## 2011-12-27 DIAGNOSIS — R9431 Abnormal electrocardiogram [ECG] [EKG]: Secondary | ICD-10-CM

## 2011-12-27 DIAGNOSIS — IMO0001 Reserved for inherently not codable concepts without codable children: Secondary | ICD-10-CM

## 2011-12-27 DIAGNOSIS — I4892 Unspecified atrial flutter: Secondary | ICD-10-CM

## 2011-12-27 DIAGNOSIS — R600 Localized edema: Secondary | ICD-10-CM

## 2011-12-27 DIAGNOSIS — F410 Panic disorder [episodic paroxysmal anxiety] without agoraphobia: Secondary | ICD-10-CM

## 2011-12-27 DIAGNOSIS — G4700 Insomnia, unspecified: Secondary | ICD-10-CM

## 2011-12-27 DIAGNOSIS — I10 Essential (primary) hypertension: Secondary | ICD-10-CM

## 2011-12-27 LAB — COMPREHENSIVE METABOLIC PANEL
ALT: <8 U/L (ref 0–53)
AST: 15 U/L (ref 0–37)
Albumin: 3.9 g/dL (ref 3.5–5.2)
Alkaline Phosphatase: 89 U/L (ref 39–117)
BUN: 21 mg/dL (ref 6–23)
CO2: 24 mEq/L (ref 19–32)
Calcium: 8.5 mg/dL (ref 8.4–10.5)
Chloride: 107 mEq/L (ref 96–112)
Creat: 1.11 mg/dL (ref 0.50–1.35)
Glucose, Bld: 76 mg/dL (ref 70–99)
Potassium: 3.8 mEq/L (ref 3.5–5.3)
Sodium: 138 mEq/L (ref 135–145)
Total Bilirubin: 3 mg/dL — ABNORMAL HIGH (ref 0.3–1.2)
Total Protein: 6 g/dL (ref 6.0–8.3)

## 2011-12-27 LAB — BRAIN NATRIURETIC PEPTIDE: Brain Natriuretic Peptide: 383 pg/mL — ABNORMAL HIGH (ref 0.0–100.0)

## 2011-12-27 LAB — PSA: PSA: 2.66 ng/mL (ref <=4.00)

## 2011-12-27 LAB — CBC
HCT: 37.9 % — ABNORMAL LOW (ref 39.0–52.0)
Hemoglobin: 12.8 g/dL — ABNORMAL LOW (ref 13.0–17.0)
MCH: 29.8 pg (ref 26.0–34.0)
MCHC: 33.8 g/dL (ref 30.0–36.0)
MCV: 88.1 fL (ref 78.0–100.0)
Platelets: 97 10*3/uL — ABNORMAL LOW (ref 150–400)
RBC: 4.3 MIL/uL (ref 4.22–5.81)
RDW: 14.8 % (ref 11.5–15.5)
WBC: 3.9 10*3/uL — ABNORMAL LOW (ref 4.0–10.5)

## 2011-12-27 MED ORDER — APIXABAN 5 MG PO TABS: 5.0000 mg | ORAL_TABLET | Freq: Two times a day (BID) | ORAL | Status: DC

## 2011-12-27 MED ORDER — METOLAZONE 2.5 MG PO TABS: 2.5000 mg | ORAL_TABLET | Freq: Every day | ORAL | Status: DC

## 2011-12-27 MED ORDER — POTASSIUM CHLORIDE CRYS ER 20 MEQ PO TBCR: 20.0000 meq | EXTENDED_RELEASE_TABLET | Freq: Every day | ORAL | Status: DC

## 2011-12-27 MED ORDER — ALPRAZOLAM 0.25 MG PO TABS: 0.2500 mg | ORAL_TABLET | Freq: Every evening | ORAL | Status: DC | PRN

## 2011-12-27 MED ORDER — TORSEMIDE 20 MG PO TABS
20.0000 mg | ORAL_TABLET | Freq: Two times a day (BID) | ORAL | Status: DC
Start: 2011-12-27 — End: 2011-12-27

## 2011-12-27 MED ORDER — TORSEMIDE 20 MG PO TABS: 40.0000 mg | ORAL_TABLET | Freq: Two times a day (BID) | ORAL | Status: DC

## 2011-12-27 NOTE — Patient Instructions (Addendum)
Please increase Torsemide to 20 mg two tablets twice a day Start Potassium Chl 20 MEQ daily. Start Eliquis 5 mg one twice a day. Take Zaroxolyn 2.5 mg one tablet for 2 days only.  Hold other tablets for later use.  Continue all other medications as listed  Please have blood work today(CBC, PT and PTT) and Monday(BMP).  Keep feet and legs elevated above the level of your heart.  Weigh daily.    Your physician has requested that you have an echocardiogram. Echocardiography is a painless test that uses sound waves to create images of your heart. It provides your doctor with information about the size and shape of your heart and how well your heart's chambers and valves are working. This procedure takes approximately one hour. There are no restrictions for this procedure.  Follow next week in office.

## 2011-12-27 NOTE — Progress Notes (Signed)
78 yo retired man with progressive DOE and weight gain, edema, and 2 pillow orthopnea for 2 months.  His urination has also decreased in response to the diuretic.  He cannot walk to the mailbox without getting short of breath.  No chest pain  He also notes that his nerves are worse.  He gets panic attacks.  Objective:  NAD Chest: bibasilar rales Heart:  Regular with I/VI systolic ejection with no rub or gallop Genitalia:  Edematous scrotum and penis Ext:  Indurated skin to knees, oozing serous fluid left leg  We discussed his sudden onset of anxiety such as yesterday when he was just sitting and eating lunch and he developed the anxiety spell for no reason.  Assessment:  Progressive fluid retention and panic disorder.I spoke with Dr. Clifton James at [facility] cardiology and they will try to make an appointment today  Also, I'll request urology appt.  Plan:   1. Pedal edema  torsemide (DEMADEX) 20 MG tablet, Ambulatory referral to Cardiology, Ambulatory referral to Urology  2. Insomnia  ALPRAZolam (XANAX) 0.25 MG tablet  3. Panic disorder  ALPRAZolam (XANAX) 0.25 MG tablet   Follow up one month

## 2011-12-27 NOTE — Patient Instructions (Addendum)
Follow up one monthHeart Failure Heart failure (HF) is a condition in which the heart has trouble pumping blood. This means your heart does not pump blood efficiently for your body to work well. In some cases of HF, fluid may back up into your lungs or you may have swelling (edema) in your lower legs. HF is a long-term (chronic) condition. It is important for you to take good care of yourself and follow your caregiver's treatment plan. CAUSES   Health conditions:  High blood pressure (hypertension) causes the heart muscle to work harder than normal. When pressure in the blood vessels is high, the heart needs to pump (contract) with more force in order to circulate blood throughout the body. High blood pressure eventually causes the heart to become stiff and weak.  Coronary artery disease (CAD) is the buildup of cholesterol and fat (plaques) in the arteries of the heart. The blockage in the arteries deprives the heart muscle of oxygen and blood. This can cause chest pain and may lead to a heart attack. High blood pressure can also contribute to CAD.  Heart attack (myocardial infarction) occurs when 1 or more arteries in the heart become blocked. The loss of oxygen damages the muscle tissue of the heart. When this happens, part of the heart muscle dies. The injured tissue does not contract as well and weakens the heart's ability to pump blood.  Abnormal heart valves can cause HF when the heart valves do not open and close properly. This makes the heart muscle pump harder to keep the blood flowing.  Heart muscle disease (cardiomyopathy or myocarditis) is damage to the heart muscle from a variety of causes. These can include drug or alcohol abuse, infections, or unknown reasons. These can increase the risk of HF.  Lung disease makes the heart work harder because the lungs do not work properly. This can cause a strain on the heart leading it to fail.  Diabetes increases the risk of HF. High blood sugar  contributes to high fat (lipid) levels in the blood. Diabetes can also cause slow damage to tiny blood vessels that carry important nutrients to the heart muscle. When the heart does not get enough oxygen and food, it can cause the heart to become weak and stiff. This leads to a heart that does not contract efficiently.  Other diseases can contribute to HF. These include abnormal heart rhythms, thyroid problems, and low blood counts (anemia).  Unhealthy lifestyle habits:  Obesity.  Smoking.  Eating foods high in fat and cholesterol.  Eating or drinking beverages high in salt.  Drug or alcohol abuse.  Lack of exercise. SYMPTOMS  HF symptoms may vary and can be hard to detect. Symptoms may include:  Shortness of breath with activity, such as climbing stairs.  Persistent cough.  Swelling of the feet, ankles, legs, or abdomen.  Unexplained weight gain.  Difficulty breathing when lying flat.  Waking from sleep because of the need to sit up and get more air.  Rapid heartbeat.  Fatigue and loss of energy.  Feeling lightheaded or close to fainting. DIAGNOSIS  A diagnosis of HF is based on your history, symptoms, physical examination, and diagnostic tests. Diagnostic tests for HF may include:  EKG.  Chest X-ray.  Blood tests.  Exercise stress test.  Blood oxygen test (arterial blood gas).  Evaluation by a heart doctor (cardiologist).  Ultrasound evaluation of the heart (echocardiogram).  Heart artery test to look for blockages (angiogram).  Radioactive imaging to look at  the heart (radionuclide test). TREATMENT  Treatment is aimed at managing the symptoms of HF. Medicines, lifestyle changes, or surgical intervention may be necessary to treat HF.  Medicines to help treat HF may include:  Angiotensin-converting enzyme (ACE) inhibitors. These block the effects of a blood protein called angiotensin-converting enzyme. ACE inhibitors relax (dilate) the blood vessels  and help lower blood pressure. This decreases the workload of the heart, slows the progression of HF, and improves symptoms.  Angiotensin receptor blockers (ARBs). These medications work similar to ACE inhibitors. ARBs may be an alternative for people who cannot tolerate an ACE inhibitor.  Aldosterone antagonists. This medication helps get rid of extra fluid from your body. This lowers the volume of blood the heart has to pump.  Water pills (diuretics). Diuretics cause the kidneys to remove salt and water from the blood. The extra fluid is removed by urination. By removing extra fluid from the body, diuretics help lower the workload of the heart and help prevent fluid buildup in the lungs so breathing is easier.  Beta blockers. These prevent the heart from beating too fast and improve heart muscle strength. Beta blockers help maintain a normal heart rate, control blood pressure, and improve HF symptoms.  Digitalis. This increases the force of the heartbeat and may be helpful to people with HF or heart rhythm problems.  Healthy lifestyle changes include:  Stopping smoking.  Eating a healthy diet. Avoid foods high in fat. Avoid foods fried in oil or made with fat. A dietician can help with healthy food choices.  Limiting how much salt you eat.  Limiting alcohol intake to no more than 1 drink per day for women and 2 drinks per day for men. Drinking more than that is harmful to your heart. If your heart has already been damaged by alcohol or you have severe HF, drinking alcohol should be stopped completely.  Exercising as directed by your caregiver.  Surgical treatment for HF may include:  Procedures to open blocked arteries, repair damaged heart valves, or remove damaged heart muscle tissue.  A pacemaker to help heart muscle function and to control certain abnormal heart rhythms.  A defibrillator to possibly prevent sudden cardiac death. HOME CARE INSTRUCTIONS   Activity level. Your  caregiver can help you determine what type of exercise program may be helpful. It is important to maintain your strength. Pace your physical activity to avoid shortness of breath or chest pain. Rest for 1 hour before and after meals. A cardiac rehabilitation program may be helpful to some people with HF.  Diet. Eat a heart healthy diet. Food choices should be low in saturated fat and cholesterol. Talk to a dietician to learn about heart healthy foods.  Salt intake. When you have HF, you need to limit the amount of salt you eat. Eat less than 1500 milligrams (mg) of salt per day or as recommended by your caregiver.  Weight monitoring. Weigh yourself every day. You should weigh yourself in the morning after you urinate and before you eat breakfast. Wear the same amount of clothing each time you weigh yourself. Record your weight daily. Bring your recorded weights to your clinic visits. Tell your caregiver right away if you have gained 3 lb/1.4 kg in 1 day, or 5 lb/2.3 kg in a week or whatever amount you were told to report.  Blood pressure monitoring. This should be done as directed by your caregiver. A home blood pressure cuff can be purchased at a drugstore. Record your blood  pressure numbers and bring them to your clinic visits. Tell your caregiver if you become dizzy or lightheaded upon standing up.  Smoking. If you are currently a smoker, it is time to quit. Nicotine makes your heart work harder by causing your blood vessels to constrict. Do not use nicotine gum or patches before talking to your caregiver.  Follow up. Be sure to schedule a follow-up visit with your caregiver. Keep all your appointments. SEEK MEDICAL CARE IF:   Your weight increases by 3 lb/1.4 kg in 1 day or 5 lb/2.3 kg in a week.  You notice increasing shortness of breath that is unusual for you. This may happen during rest, sleep, or with activity.  You cough more than normal, especially with physical activity.  You notice  more swelling in your hands, feet, ankles, or belly (abdomen).  You are unable to sleep because it is hard to breathe.  You cough up bloody mucus (sputum).  You begin to feel "jumping" or "fluttering" sensations (palpitations) in your chest. SEEK IMMEDIATE MEDICAL CARE IF:   You have severe chest pain or pressure which may include symptoms such as:  Pain or pressure in the arms, neck, jaw, or back.  Feeling sweaty.  Feeling sick to your stomach (nauseous).  Feeling short of breath while at rest.  Having a fast or irregular heartbeat.  You experience stroke symptoms. These symptoms include:  Facial weakness or numbness.  Weakness or numbness in an arm, leg, or on one side of your body.  Blurred vision.  Difficulty talking or thinking.  Dizziness or fainting.  Severe headache. MAKE SURE YOU:   Understand these instructions.  Will watch your condition.  Will get help right away if you are not doing well or get worse. Document Released: 12/25/2004 Document Revised: 06/26/2011 Document Reviewed: 04/08/2009 [facility] Patient Information 2013 [place], [place].

## 2011-12-27 NOTE — Progress Notes (Signed)
HPI The patient presents for followup of a heart murmur. He was also thought possibly to have atrial fibrillation on EKG done recently. I reviewed this he had atrial ectopy and sinus rhythm. There was no evidence of atrial fibrillation though the EKG was somewhat irregular. He does have a harsh heart murmur. I reviewed some hospital records and identified in echocardiogram in 2008 that demonstrated aortic valve sclerosis without stenosis. He also had catheterization in 2007 demonstrating an LAD stenosis of 25%.  I saw him in July of 2012. He had some atrial ectopy. Echo did demonstrate a well preserved ejection fraction with some aortic sclerosis and evidence of severe tricuspid regurgitation with elevated pulmonary pressures. At that time was asymptomatic and the plan was for six-month followup. However, for some reason this appointment did not happen.  He was added to my schedule today after seeing LAUENSTEIN,KURT, MD who noted his edema and weight gain. The patient reports that for 2 months he has had increasing shortness of breath. He's been sleeping on 2 pillows. She's had a 20 pound weight gain. He says his dyspnea walking a moderate distance on level ground. He notices rare fluttering in his chest but he has had no presyncope or syncope. He denies any chest pressure, neck or arm discomfort. He's had no cough fevers or chills.   Allergies  Allergen Reactions  . Menthol Hives    Current Outpatient Prescriptions  Medication Sig Dispense Refill  . ALPRAZolam (XANAX) 0.25 MG tablet Take 1 tablet (0.25 mg total) by mouth at bedtime as needed.  90 tablet  1  . Cod Liver Oil 1000 MG CAPS Take 1 capsule by mouth daily.        Marland Kitchen doxazosin (CARDURA) 2 MG tablet Take 1 tablet (2 mg total) by mouth at bedtime. Needs office visit. Final notice.  90 tablet  3  . GARLIC PO Take by mouth daily.      . Multiple Vitamins-Minerals (MULTIVITAMINS THER. W/MINERALS) TABS Take 1 tablet by mouth daily.        Marland Kitchen  omeprazole (PRILOSEC) 20 MG capsule Take 1 capsule (20 mg total) by mouth daily.  90 capsule  3  . torsemide (DEMADEX) 20 MG tablet Take 1 tablet (20 mg total) by mouth 2 (two) times daily.  180 tablet  3    Past Medical History  Diagnosis Date  . GERD (gastroesophageal reflux disease)   . Hypertension   . Blind     Right enucleation  . Hearing loss   . Coronary artery disease     Minimal LAD stenosis 2007  . BPH (benign prostatic hypertrophy)   . Hyperlipidemia   . Varicose vein   . Peripheral neuropathy   . Tremor, essential   . Aortic sclerosis     2008    Past Surgical History  Procedure Date  . Eye surgery   . Lung surgery     Benign lump removed   ROS:  Positive for constipation, reflux, difficulty swallowing, urinary frequency..  Otherwise as stated in the HPI and negative for all other systems.  PHYSICAL EXAM BP 120/50  Pulse 58  Ht 5\' 7"  (1.702 m)  Wt 221 lb 1.9 oz (100.299 kg)  BMI 34.63 kg/m2 GENERAL:  Well appearing HEENT:  Pupils equal round and reactive, fundi not visualized, oral mucosa unremarkable NECK:  Jugular venous distention to the jaw at 45, waveform within normal limits, carotid upstroke brisk and symmetric, no bruits, no thyromegaly LYMPHATICS:  No cervical,  inguinal adenopathy LUNGS:  Clear to auscultation bilaterally BACK:  No CVA tenderness CHEST:  Unremarkable HEART:  PMI not displaced or sustained,S1 and S2 within normal limits, no S3,  no clicks, no rubs, harsh systolic murmur radiating out the outflow tract and mid to late peaking, no diastolic murmurs,irregular ABD:  Flat, positive bowel sounds normal in frequency in pitch, no bruits, no rebound, no guarding, no midline pulsatile mass, positive  hepatomegaly, no splenomegaly, obese EXT:  2 plus pulses throughout, severe bilateral moderate edema, no cyanosis no clubbing, venous stasis changes SKIN:  No rashes no nodules NEURO:  Cranial nerves II through XII grossly intact, motor grossly  intact throughout PSYCH:  Cognitively intact, oriented to person place and time   EKG:  Atrial flutter, right bundle branch block, first degree AV block, premature ventricular contractions.  12/27/2011   ASSESSMENT AND PLAN  Atrial flutter - He is going to need anticoagulation. I will check a CBC today. I will also check a PT PTT. I will start him on Eliquis.  Of note a beam that was drawn today and is pending.  Aortic sclerosis -   I will repeat an echocardiogram to further evaluate his pulmonary pressures and his aortic sclerosis  Hypertension -  This will be managed in the context of treating the above issues.   Coronary artery disease -  I will probably pursue an ischemia evaluation in the future pending the above treatment.   Pulmonary hypertension - This will be evaluated with the echo as above.  Edema - This seems to be worse than previous. He has significant severe right-sided failure and I will check the echocardiogram. I'm going to give him Zaroxolyn 2.5 mg for the next 2 days. His Demadex was doubled today. I will continue with this. He will need to come back Monday for a repeat basic metabolic profile. He is likely to need some potassium and I will give him 20 mEq daily. He'll need to have followup in the next several days.

## 2011-12-28 LAB — CBC WITH DIFFERENTIAL/PLATELET
Basophils Absolute: 0 10*3/uL (ref 0.0–0.1)
Eosinophils Relative: 1.5 % (ref 0.0–5.0)
HCT: 39.4 % (ref 39.0–52.0)
Lymphs Abs: 0.8 10*3/uL (ref 0.7–4.0)
Monocytes Absolute: 0.4 10*3/uL (ref 0.1–1.0)
Monocytes Relative: 10.5 % (ref 3.0–12.0)
Neutrophils Relative %: 69.3 % (ref 43.0–77.0)
Platelets: 90 10*3/uL — ABNORMAL LOW (ref 150.0–400.0)
RDW: 14.5 % (ref 11.5–14.6)
WBC: 4.2 10*3/uL — ABNORMAL LOW (ref 4.5–10.5)

## 2011-12-28 LAB — BASIC METABOLIC PANEL
BUN: 23 mg/dL (ref 6–23)
Calcium: 8.9 mg/dL (ref 8.4–10.5)
GFR: 64.99 mL/min (ref >60.00)
Glucose, Bld: 88 mg/dL (ref 70–99)
Potassium: 4.3 mEq/L (ref 3.5–5.1)
Sodium: 141 mEq/L (ref 135–145)

## 2011-12-28 LAB — PROTIME-INR
INR: 1.5 ratio — ABNORMAL HIGH (ref 0.8–1.0)
Prothrombin Time: 15.8 s — ABNORMAL HIGH (ref 10.2–12.4)

## 2011-12-31 ENCOUNTER — Other Ambulatory Visit: Payer: Self-pay

## 2011-12-31 ENCOUNTER — Ambulatory Visit (INDEPENDENT_AMBULATORY_CARE_PROVIDER_SITE_OTHER): Payer: Medicare Other | Admitting: *Deleted

## 2011-12-31 DIAGNOSIS — I10 Essential (primary) hypertension: Secondary | ICD-10-CM

## 2011-12-31 LAB — BASIC METABOLIC PANEL
Calcium: 9.5 mg/dL (ref 8.4–10.5)
Creatinine, Ser: 1.5 mg/dL (ref 0.4–1.5)
Sodium: 137 mEq/L (ref 135–145)

## 2011-12-31 MED ORDER — POTASSIUM CHLORIDE CRYS ER 20 MEQ PO TBCR: 20.0000 meq | EXTENDED_RELEASE_TABLET | Freq: Two times a day (BID) | ORAL | Status: DC

## 2012-01-03 ENCOUNTER — Other Ambulatory Visit (HOSPITAL_COMMUNITY): Payer: Medicare Other

## 2012-01-03 ENCOUNTER — Ambulatory Visit: Payer: Medicare Other | Admitting: Cardiology

## 2012-01-14 ENCOUNTER — Ambulatory Visit (HOSPITAL_COMMUNITY): Payer: Medicare Other | Attending: Cardiology | Admitting: Radiology

## 2012-01-14 ENCOUNTER — Ambulatory Visit (INDEPENDENT_AMBULATORY_CARE_PROVIDER_SITE_OTHER): Payer: Medicare Other | Admitting: Cardiology

## 2012-01-14 ENCOUNTER — Encounter: Payer: Self-pay | Admitting: Cardiology

## 2012-01-14 ENCOUNTER — Other Ambulatory Visit (INDEPENDENT_AMBULATORY_CARE_PROVIDER_SITE_OTHER): Payer: Medicare Other

## 2012-01-14 VITALS — BP 132/60 | HR 58 | Ht 67.0 in | Wt 197.0 lb

## 2012-01-14 DIAGNOSIS — I369 Nonrheumatic tricuspid valve disorder, unspecified: Secondary | ICD-10-CM | POA: Insufficient documentation

## 2012-01-14 DIAGNOSIS — R609 Edema, unspecified: Secondary | ICD-10-CM

## 2012-01-14 DIAGNOSIS — I2510 Atherosclerotic heart disease of native coronary artery without angina pectoris: Secondary | ICD-10-CM | POA: Insufficient documentation

## 2012-01-14 DIAGNOSIS — I359 Nonrheumatic aortic valve disorder, unspecified: Secondary | ICD-10-CM | POA: Insufficient documentation

## 2012-01-14 DIAGNOSIS — Z7901 Long term (current) use of anticoagulants: Secondary | ICD-10-CM

## 2012-01-14 DIAGNOSIS — R600 Localized edema: Secondary | ICD-10-CM

## 2012-01-14 DIAGNOSIS — IMO0001 Reserved for inherently not codable concepts without codable children: Secondary | ICD-10-CM

## 2012-01-14 DIAGNOSIS — I350 Nonrheumatic aortic (valve) stenosis: Secondary | ICD-10-CM

## 2012-01-14 DIAGNOSIS — I10 Essential (primary) hypertension: Secondary | ICD-10-CM

## 2012-01-14 DIAGNOSIS — I4892 Unspecified atrial flutter: Secondary | ICD-10-CM

## 2012-01-14 MED ORDER — TORSEMIDE 20 MG PO TABS: 20.0000 mg | ORAL_TABLET | Freq: Two times a day (BID) | ORAL | Status: DC

## 2012-01-14 NOTE — Progress Notes (Signed)
HPI    I am seeing the patient in the office today for Dr.Hochrein. The patient had been seen in the office on December 27, 2011. He had been on Demadex 20 mg daily. His primary care team noted that he was volume overloaded and increased his Demadex to 40 mg a day. When he was seen in the cardiology office his Demadex was actually increased to 40 mg twice a day and he was given 2 days of Zaroxolyn. The patient has had a marked diuresis. His weight is down 24 pounds. He was scheduled to be seen back early with a followup echo. The patient moved the appointment and the echo appointment and he was moved to my schedule today. He is followed by Dr. Antoine Poche. The patient feels well. Chemistry labs checked on December 19,2013 with BUN 23 and creatinine 1.2. Potassium was 4.3. He had a followup lab on December 23 with a BUN of 31 and creatinine 1.5 and potassium 3.1. He was then given potassium. He is now here for followup today. His two-dimensional echo revealed an ejection fraction of 60-65%. There was mild LVH. He has mild aortic stenosis. There is moderate tricuspid regurgitation.  Allergies  Allergen Reactions  . Menthol Hives    Current Outpatient Prescriptions  Medication Sig Dispense Refill  . ALPRAZolam (XANAX) 0.25 MG tablet Take 1 tablet (0.25 mg total) by mouth at bedtime as needed.  90 tablet  1  . apixaban (ELIQUIS) 5 MG TABS tablet Take 1 tablet (5 mg total) by mouth 2 (two) times daily.  60 tablet  6  . Cod Liver Oil 1000 MG CAPS Take 1 capsule by mouth daily.        Marland Kitchen doxazosin (CARDURA) 2 MG tablet Take 1 tablet (2 mg total) by mouth at bedtime. Needs office visit. Final notice.  90 tablet  3  . GARLIC PO Take by mouth daily.      . Multiple Vitamins-Minerals (MULTIVITAMINS THER. W/MINERALS) TABS Take 1 tablet by mouth daily.        Marland Kitchen omeprazole (PRILOSEC) 20 MG capsule Take 1 capsule (20 mg total) by mouth daily.  90 capsule  3  . potassium chloride SA (K-DUR,KLOR-CON) 20 MEQ tablet  Take 1 tablet (20 mEq total) by mouth 2 (two) times daily.  60 tablet  6  . torsemide (DEMADEX) 20 MG tablet Take 1 tablet (20 mg total) by mouth 2 (two) times daily.  60 tablet  6    History   Social History  . Marital Status: Married    Spouse Name: N/A    Number of Children: 3  . Years of Education: N/A   Occupational History  . [employer]    Social History Main Topics  . Smoking status: Former Smoker    Types: Cigarettes    Quit date: 07/20/1975  . Smokeless tobacco: Never Used  . Alcohol Use: No  . Drug Use: No  . Sexually Active: Not on file   Other Topics Concern  . Not on file   Social History Narrative   Patient has hostile neighbor with multiple police reports.  Patient is happily married, but son is at home and is not happy which creates anxious and frustrating relationships.    Family History  Problem Relation Age of Onset  . Cancer Father     Rectal  . Cancer Mother     Breast    Past Medical History  Diagnosis Date  . GERD (gastroesophageal reflux disease)   .  Hypertension   . Blind     Right enucleation  . Hearing loss   . Coronary artery disease     Minimal LAD stenosis 2007  . BPH (benign prostatic hypertrophy)   . Hyperlipidemia   . Varicose vein   . Peripheral neuropathy   . Tremor, essential   . Aortic sclerosis     2008  . Aortic stenosis     Mild, echo, January, 2014    Past Surgical History  Procedure Date  . Eye surgery   . Lung surgery     Benign lump removed    Patient Active Problem List  Diagnosis  . Hypertension  . Aortic sclerosis  . Coronary artery disease  . Abnormal EKG  . Obese  . Blind left eye  . Hearing impaired  . BPH (benign prostatic hyperplasia)  . Hyperlipidemia  . GERD (gastroesophageal reflux disease)  . Peripheral neuropathy  . Aortic stenosis    ROS   Patient denies fever, chills, headache, sweats, rash, change in vision, change in hearing, chest pain, cough, nausea vomiting,  urinary symptoms. All other systems are reviewed and are negative.  PHYSICAL EXAM   Patient is here with his wife. He is oriented to person time and place. Affect is normal. There is no jugulovenous distention. Lungs are clear. Respiratory effort is nonlabored. Cardiac exam reveals S1 and S2. There is a 3/6 crescendo decrescendo systolic murmur compatible with his aortic valve disease. His abdomen is soft. There was no peripheral edema. There were no musculoskeletal deformities. He does have an abnormality of his left eye. There were no skin rashes.  Filed Vitals:   01/14/12 1517  BP: 132/60  Pulse: 58  Height: 5\' 7"  (1.702 m)  Weight: 197 lb (89.359 kg)  SpO2: 97%     ASSESSMENT & PLAN

## 2012-01-14 NOTE — Assessment & Plan Note (Signed)
In the past it was noted that patient had aortic sclerosis. His gradient is now high enough to be called mild aortic stenosis. He does not need any further workup at this time.

## 2012-01-14 NOTE — Assessment & Plan Note (Signed)
Blood pressures control today.

## 2012-01-14 NOTE — Assessment & Plan Note (Signed)
Coronary disease is stable. No change in therapy. 

## 2012-01-14 NOTE — Assessment & Plan Note (Signed)
The patient's edema is gone. Chemistry lab will be checked again today. I spent greater than 25 minutes working with the patient. More than half of this time was spent with direct contact with the patient. I was very careful to be sure that I had a clear understanding of his diuretic dosing. He did take only 2 days of Zaroxolyn. He has remained however on 40 mg twice a day of Demadex since December 19. Originally he was on only 20 mg daily. I have changed him today to 20 mg twice a day. I'm also arranging for him to have early followup with Dr. Antoine Poche.

## 2012-01-14 NOTE — Patient Instructions (Addendum)
Your physician has recommended you make the following change in your medication: DECREASE your Demadex to 20mg  (one tab) twice daily.  Your physician recommends that you schedule a follow-up appointment in: with Dr Antoine Poche in 2 weeks  Your physician recommends that you return for lab work in: today (bmet)

## 2012-01-14 NOTE — Assessment & Plan Note (Signed)
The patient was noted to be in atrial flutter at the time the last visit.  Apixaban was started at 5 mg twice daily. Heart rate is controlled. I did not do an EKG today.

## 2012-01-14 NOTE — Assessment & Plan Note (Signed)
The patient is taking Apixaban.

## 2012-01-14 NOTE — Progress Notes (Signed)
Echocardiogram performed.  

## 2012-01-15 LAB — BASIC METABOLIC PANEL
CO2: 30 mEq/L (ref 19–32)
Calcium: 9.2 mg/dL (ref 8.4–10.5)
Creatinine, Ser: 1.3 mg/dL (ref 0.4–1.5)
GFR: 55.92 mL/min — ABNORMAL LOW (ref >60.00)
Sodium: 137 mEq/L (ref 135–145)

## 2012-01-21 ENCOUNTER — Ambulatory Visit (INDEPENDENT_AMBULATORY_CARE_PROVIDER_SITE_OTHER): Payer: Medicare Other | Admitting: Family Medicine

## 2012-01-21 ENCOUNTER — Encounter: Payer: Self-pay | Admitting: Family Medicine

## 2012-01-21 VITALS — BP 124/50 | HR 49 | Temp 97.6°F | Resp 16 | Ht 67.5 in | Wt 200.2 lb

## 2012-01-21 DIAGNOSIS — M1990 Unspecified osteoarthritis, unspecified site: Secondary | ICD-10-CM

## 2012-01-21 DIAGNOSIS — R609 Edema, unspecified: Secondary | ICD-10-CM

## 2012-01-21 DIAGNOSIS — R600 Localized edema: Secondary | ICD-10-CM

## 2012-01-21 MED ORDER — PREDNISONE 20 MG PO TABS: ORAL_TABLET | ORAL | Status: DC

## 2012-01-21 NOTE — Patient Instructions (Addendum)
Take a Zaroxylyn 2.5 mg once weekly along with the Demadex

## 2012-01-21 NOTE — Progress Notes (Signed)
79 yo retired man who was last seen in December with marked fluid retention.  Since then, he lost 20 lbs by taking the Zarolxylyn 2.5 for two days in a row.  He now feels much better and has been followed by the cardiologist Dr. Hollice Gong.  He is taking the Demadex 40 mg daily.    His feet have been sore lately despite no known injury.  Obj: NAD Lungs: clear Heart: II/VI systolic murmur Ext:  2+ bilateral edema with leathery changes 1/2 way up the shin  Assessment:  Improved fluid status, still slightly overloaded.  Mild foot arthritis.  Plan:  Take zaroxylyn once a week Prednisone

## 2012-01-29 ENCOUNTER — Encounter: Payer: Self-pay | Admitting: Cardiology

## 2012-01-29 ENCOUNTER — Ambulatory Visit (INDEPENDENT_AMBULATORY_CARE_PROVIDER_SITE_OTHER): Payer: Medicare Other | Admitting: Cardiology

## 2012-01-29 VITALS — BP 146/64 | HR 65 | Ht 67.0 in | Wt 188.0 lb

## 2012-01-29 DIAGNOSIS — I350 Nonrheumatic aortic (valve) stenosis: Secondary | ICD-10-CM

## 2012-01-29 DIAGNOSIS — I10 Essential (primary) hypertension: Secondary | ICD-10-CM

## 2012-01-29 DIAGNOSIS — I4892 Unspecified atrial flutter: Secondary | ICD-10-CM

## 2012-01-29 DIAGNOSIS — I359 Nonrheumatic aortic valve disorder, unspecified: Secondary | ICD-10-CM

## 2012-01-29 DIAGNOSIS — I2510 Atherosclerotic heart disease of native coronary artery without angina pectoris: Secondary | ICD-10-CM

## 2012-01-29 LAB — BASIC METABOLIC PANEL
BUN: 26 mg/dL — ABNORMAL HIGH (ref 6–23)
GFR: 66.99 mL/min (ref >60.00)
Potassium: 3.6 mEq/L (ref 3.5–5.1)
Sodium: 135 mEq/L (ref 135–145)

## 2012-01-29 NOTE — Progress Notes (Signed)
HPI The patient presents for followup of a aortic stenosis and edema.  When I last saw him in December he had massive volume overload. We increased his diuretics and since then he has lost 32 pounds. I did order an echocardiogram which demonstrated some mild pulmonary hypertension. The left ventricle her ejection fraction however was well-preserved. He had followup with Dr. Myrtis Ser and actually was continuing to take a higher dose of diuretic which was subsequently reduced. His kidney function and potassium have been very stable. He says that since losing the weight he feels much better. He is breathing better. He denies any PND or orthopnea. He's not having any palpitations, presyncope or syncope. He's had no chest pressure, neck or arm discomfort. I did start him on Eliquis when I last saw him and he is tolerating this. He's had no bleeding.   Allergies  Allergen Reactions  . Menthol Hives    Current Outpatient Prescriptions  Medication Sig Dispense Refill  . ALPRAZolam (XANAX) 0.25 MG tablet Take 1 tablet (0.25 mg total) by mouth at bedtime as needed.  90 tablet  1  . apixaban (ELIQUIS) 5 MG TABS tablet Take 1 tablet (5 mg total) by mouth 2 (two) times daily.  60 tablet  6  . Cod Liver Oil 1000 MG CAPS Take 1 capsule by mouth daily.        Marland Kitchen doxazosin (CARDURA) 2 MG tablet Take 1 tablet (2 mg total) by mouth at bedtime. Needs office visit. Final notice.  90 tablet  3  . GARLIC PO Take by mouth daily.      . metolazone (ZAROXOLYN) 2.5 MG tablet Take 2.5 mg by mouth once. 1 tab once a week      . Multiple Vitamins-Minerals (MULTIVITAMINS THER. W/MINERALS) TABS Take 1 tablet by mouth daily.        Marland Kitchen omeprazole (PRILOSEC) 20 MG capsule Take 1 capsule (20 mg total) by mouth daily.  90 capsule  3  . potassium chloride SA (K-DUR,KLOR-CON) 20 MEQ tablet Take 1 tablet (20 mEq total) by mouth 2 (two) times daily.  60 tablet  6  . torsemide (DEMADEX) 20 MG tablet Take 20 mg by mouth daily. Takes 2  tablets once daily        Past Medical History  Diagnosis Date  . GERD (gastroesophageal reflux disease)   . Hypertension   . Blind     Right enucleation  . Hearing loss   . Coronary artery disease     Minimal LAD stenosis 2007  . BPH (benign prostatic hypertrophy)   . Hyperlipidemia   . Varicose vein   . Peripheral neuropathy   . Tremor, essential   . Aortic sclerosis     2008  . Aortic stenosis     Mild, echo, January, 2014  . Atrial flutter     December, 2013  . Chronic anticoagulation     Apixaban started December 27, 2011  . Edema     December, 2013    Past Surgical History  Procedure Date  . Eye surgery   . Lung surgery     Benign lump removed   ROS:  Positive for constipation, reflux, difficulty swallowing, urinary frequency..  Otherwise as stated in the HPI and negative for all other systems.  PHYSICAL EXAM BP 146/64  Pulse 65  Ht 5\' 7"  (1.702 m)  Wt 188 lb (85.276 kg)  BMI 29.44 kg/m2 GENERAL:  Well appearing HEENT:  Pupils equal round and reactive, fundi not  visualized, oral mucosa unremarkable NECK:  Jugular venous distention to the jaw at 45, waveform within normal limits, carotid upstroke brisk and symmetric, no bruits, no thyromegaly LYMPHATICS:  No cervical, inguinal adenopathy LUNGS:  Clear to auscultation bilaterally BACK:  No CVA tenderness CHEST:  Unremarkable HEART:  PMI not displaced or sustained,S1 and S2 within normal limits, no S3,  no clicks, no rubs, harsh systolic murmur radiating out the outflow tract and mid to late peaking, no diastolic murmurs,irregular ABD:  Flat, positive bowel sounds normal in frequency in pitch, no bruits, no rebound, no guarding, no midline pulsatile mass, positive  hepatomegaly, no splenomegaly, obese EXT:  2 plus pulses throughout,  mild bilateral edema, no cyanosis no clubbing, venous stasis changes SKIN:  No rashes no nodules NEURO:  Cranial nerves II through XII grossly intact, motor grossly intact  throughout PSYCH:  Cognitively intact, oriented to person place and time    ASSESSMENT AND PLAN  Atrial flutter - He seems to be tolerating the Eliquis.  I discussed with him again the risks and benefits of this. He agrees to continue.  Aortic sclerosis -  He has very mild aortic stenosis. No change in therapy is indicated.  Hypertension -  The blood pressure is at target. No change in medications is indicated. We will continue with therapeutic lifestyle changes (TLC).  Coronary artery disease -  I will probably pursue an ischemia evaluation in the future pending the above treatment.   Pulmonary hypertension - The pulmonary hypertension is mild to moderate. Be managed as above. No change in therapy is indicated.  Edema - I will have him continue his current regimen. I will check a basic metabolic profile today.

## 2012-01-29 NOTE — Patient Instructions (Addendum)
Your physician recommends that you schedule a follow-up appointment in: 3 MONTHS WITH DR [place] [facility]  Your physician recommends that you HAVE LAB WORK TODAY

## 2012-01-31 ENCOUNTER — Encounter: Payer: Medicare Other | Admitting: Family Medicine

## 2012-02-01 ENCOUNTER — Telehealth: Payer: Self-pay | Admitting: Cardiology

## 2012-02-01 NOTE — Telephone Encounter (Signed)
Reviewed results of lab work  He is aware to continue same RX and f/u in 3 months

## 2012-02-01 NOTE — Telephone Encounter (Signed)
New problem:  Test results.  

## 2012-02-16 ENCOUNTER — Other Ambulatory Visit: Payer: Self-pay | Admitting: Physician Assistant

## 2012-04-25 ENCOUNTER — Telehealth: Payer: Self-pay | Admitting: Cardiology

## 2012-04-25 NOTE — Telephone Encounter (Signed)
Spoke with pt, for the last 2 weeks he has had dizziness after walking across the floor and blurred vision. He also states he is having trouble getting to sleep. Explained to pt those symptoms are probably not due to the eliquis, he has been taking it since [place]. He reports he decreased the eliquis to once daily because of the potential for bleeding. Encouraged pt to take the eliquis twice daily to decrease his risk of stroke due to atrial flutter. Pt voiced understanding. He has an appt with dr Antoine Poche Thursday next week. The pts weight is stable, he does have edema in his legs but he reports it is stable. He checks his bp and it usually runs 120's to 130's, the other day it was 100. He is having trouble getting to sleep but admits to daytime somnolence, he denies SOB when lying down. The pt will make sure he is drinking fluids but cautioned about over drinking. He was encouraged to take the eliquis twice daily and to not stop it completely. He will keep his appt next week with dr hochrein and discuss the risk and benefits of anticoagulation. Pt voiced understanding and agreed with this plan.

## 2012-04-25 NOTE — Telephone Encounter (Signed)
New problem    C/O side effects from Eliquis , blurred vision. Trouble sleep at night.

## 2012-05-01 ENCOUNTER — Ambulatory Visit (INDEPENDENT_AMBULATORY_CARE_PROVIDER_SITE_OTHER): Payer: Medicare Other | Admitting: Cardiology

## 2012-05-01 ENCOUNTER — Encounter: Payer: Self-pay | Admitting: Cardiology

## 2012-05-01 VITALS — BP 132/64 | HR 52 | Ht 67.0 in | Wt 202.0 lb

## 2012-05-01 DIAGNOSIS — I10 Essential (primary) hypertension: Secondary | ICD-10-CM

## 2012-05-01 DIAGNOSIS — I350 Nonrheumatic aortic (valve) stenosis: Secondary | ICD-10-CM

## 2012-05-01 DIAGNOSIS — I2510 Atherosclerotic heart disease of native coronary artery without angina pectoris: Secondary | ICD-10-CM

## 2012-05-01 DIAGNOSIS — I359 Nonrheumatic aortic valve disorder, unspecified: Secondary | ICD-10-CM

## 2012-05-01 DIAGNOSIS — I4892 Unspecified atrial flutter: Secondary | ICD-10-CM

## 2012-05-01 NOTE — Progress Notes (Signed)
HPI The patient presents for followup of diastolic HF.  When I last saw him in December he had massive volume overload. We increased his diuretics and since then he lost 32 pounds. I did order an echocardiogram which demonstrated some mild pulmonary hypertension. The left ventricle her ejection fraction however was well-preserved. He also was noted to have atrial flutter. He has been taking Eliquis.  He unfortunately I don't think has been particularly compliant with his medications very he was only taking the Eliquis once daily.  He was only taking his Lasix rarely. He does use some salt but he's cut back. He tries to keep his feet elevated but his recliner doesn't incline his feet enough. He is now about 14 pounds since I last saw him. He does have some dyspnea with exertion. However, he's not having any chest pressure, neck or arm discomfort. He's not having any palpitations, presyncope or syncope. He's not describing any new PND or orthopnea.   Allergies  Allergen Reactions  . Menthol Hives    Current Outpatient Prescriptions  Medication Sig Dispense Refill  . ALPRAZolam (XANAX) 0.25 MG tablet Take 1 tablet (0.25 mg total) by mouth at bedtime as needed.  90 tablet  1  . apixaban (ELIQUIS) 5 MG TABS tablet Take 1 tablet (5 mg total) by mouth 2 (two) times daily.  60 tablet  6  . Cod Liver Oil 1000 MG CAPS Take 1 capsule by mouth daily.        Marland Kitchen doxazosin (CARDURA) 2 MG tablet Take 1 tablet (2 mg total) by mouth at bedtime. Needs office visit. Final notice.  90 tablet  3  . GARLIC PO Take by mouth daily.      . metolazone (ZAROXOLYN) 2.5 MG tablet Take 2.5 mg by mouth once. 1 tab once a week      . Multiple Vitamins-Minerals (MULTIVITAMINS THER. W/MINERALS) TABS Take 1 tablet by mouth daily.        Marland Kitchen omeprazole (PRILOSEC) 20 MG capsule Take 1 capsule (20 mg total) by mouth daily.  90 capsule  3  . potassium chloride SA (K-DUR,KLOR-CON) 20 MEQ tablet Take 1 tablet (20 mEq total) by mouth 2  (two) times daily.  60 tablet  6  . torsemide (DEMADEX) 20 MG tablet Take 20 mg by mouth daily. Takes 2 tablets once daily       No current facility-administered medications for this visit.    Past Medical History  Diagnosis Date  . GERD (gastroesophageal reflux disease)   . Hypertension   . Blind     Right enucleation  . Hearing loss   . Coronary artery disease     Minimal LAD stenosis 2007  . BPH (benign prostatic hypertrophy)   . Hyperlipidemia   . Varicose vein   . Peripheral neuropathy   . Tremor, essential   . Aortic sclerosis     2008  . Aortic stenosis     Mild, echo, January, 2014  . Atrial flutter     December, 2013  . Chronic anticoagulation     Apixaban started December 27, 2011  . Edema     December, 2013    Past Surgical History  Procedure Laterality Date  . Eye surgery    . Lung surgery      Benign lump removed   ROS:  As stated in the HPI and negative for all other systems.  PHYSICAL EXAM BP 132/64  Pulse 52  Ht 5\' 7"  (1.702 m)  Wt 202 lb (91.627 kg)  BMI 31.63 kg/m2 GENERAL:  Well appearing NECK:  Jugular venous distention to the jaw at 45, waveform within normal limits, carotid upstroke brisk and symmetric, no bruits, no thyromegaly LYMPHATICS:  No cervical, inguinal adenopathy LUNGS:  Clear to auscultation bilaterally BACK:  No CVA tenderness CHEST:  Unremarkable HEART:  PMI not displaced or sustained,S1 and S2 within normal limits, no S3,  no clicks, no rubs, harsh systolic murmur radiating out the outflow tract and mid to late peaking, no diastolic murmurs,irregular ABD:  Flat, positive bowel sounds normal in frequency in pitch, no bruits, no rebound, no guarding, no midline pulsatile mass, positive  hepatomegaly, no splenomegaly, obese EXT:  2 plus pulses throughout,  Moderate bilateral edema, no cyanosis no clubbing, venous stasis changes SKIN:  No rashes no nodules, mild bruising  EKG:  Atrial flutter with slow ventricular response.,  Right bundle branch block.  No change from previous.  ASSESSMENT AND PLAN  Atrial flutter - He seems to be tolerating the Eliquis.  We again had a long discussion about the risk benefits of anticoagulation and in particular the risk of stroke. He will continue the Eliquis.  He does have a slow heart rate and bundle branch block but no symptoms. No change in therapy is planned.  Aortic sclerosis -  He has very mild aortic stenosis. No change in therapy is indicated.  Hypertension -  This will be managed in the context of treating his diastolic heart failure.  Coronary artery disease -  He has no active symptoms. No change in therapy is indicated.  Pulmonary hypertension - The pulmonary hypertension is mild to moderate. Be managed as above. No change in therapy is indicated.  Edema - The patient is having recurrence of his edema. However, he's not been taking his medicines. I don't think his particularly compliant with other suggestions though he says he will start to be better about keeping his feet elevated, he will watch his salt even more and he will start to take his diuretics. I will check a basic metabolic profile in one week after he has resumed diuretics as previously prescribed.  We had a long discussion about the consequences of not participating aggressively and volume management.

## 2012-05-01 NOTE — Patient Instructions (Addendum)
Lab work Nutritional therapist in 1 week.  Restart your fluid pill. [facility]) Return to see Dr.Hochrein in 4months.

## 2012-05-05 ENCOUNTER — Other Ambulatory Visit (INDEPENDENT_AMBULATORY_CARE_PROVIDER_SITE_OTHER): Payer: Medicare Other

## 2012-05-05 DIAGNOSIS — I10 Essential (primary) hypertension: Secondary | ICD-10-CM

## 2012-05-05 LAB — BASIC METABOLIC PANEL
CO2: 32 mEq/L (ref 19–32)
Calcium: 9.3 mg/dL (ref 8.4–10.5)
Potassium: 3.1 mEq/L — ABNORMAL LOW (ref 3.5–5.1)
Sodium: 139 mEq/L (ref 135–145)

## 2012-05-06 ENCOUNTER — Other Ambulatory Visit: Payer: Self-pay | Admitting: *Deleted

## 2012-05-06 DIAGNOSIS — I10 Essential (primary) hypertension: Secondary | ICD-10-CM

## 2012-05-06 MED ORDER — POTASSIUM CHLORIDE CRYS ER 20 MEQ PO TBCR: EXTENDED_RELEASE_TABLET | ORAL | Status: DC

## 2012-06-17 ENCOUNTER — Telehealth: Payer: Self-pay

## 2012-06-17 DIAGNOSIS — F410 Panic disorder [episodic paroxysmal anxiety] without agoraphobia: Secondary | ICD-10-CM

## 2012-06-17 DIAGNOSIS — G4700 Insomnia, unspecified: Secondary | ICD-10-CM

## 2012-06-17 NOTE — Telephone Encounter (Signed)
Pharm requests RF of alprazolam 0.25 mg to [facility], fax [phone]

## 2012-06-18 MED ORDER — ALPRAZOLAM 0.25 MG PO TABS: 0.2500 mg | ORAL_TABLET | Freq: Every evening | ORAL | Status: DC | PRN

## 2012-06-18 NOTE — Telephone Encounter (Signed)
Will do today

## 2012-06-19 ENCOUNTER — Ambulatory Visit (INDEPENDENT_AMBULATORY_CARE_PROVIDER_SITE_OTHER): Payer: Medicare Other | Admitting: Family Medicine

## 2012-06-19 ENCOUNTER — Encounter: Payer: Self-pay | Admitting: Family Medicine

## 2012-06-19 VITALS — BP 124/48 | HR 49 | Temp 98.1°F | Resp 16 | Ht 66.0 in | Wt 193.6 lb

## 2012-06-19 DIAGNOSIS — L570 Actinic keratosis: Secondary | ICD-10-CM

## 2012-06-19 NOTE — Progress Notes (Signed)
79 yo man with aortic stenosis, atrial arrhythmia, chronic edema, persistent peripheral neuropathy(capsascin helps some) who is here today to evaluate dry scaly area bilateral sideburn area of face.  This began 2 months ago and he has a long h/o sun exposure. He is taking the potassium 1 tab twice a day. No dyspnea or chest pain.  Objective:  NAD Bilateral actinic keratoses: sideburn area, 2 on right and 1 on the left.  These three areas were sprayed with liquid nitrogen for 15 secs.  Also, one area on left ear was cryo'd with slight roughness palpated on outer helix. Bilateral 2+ pitting edema with mild postphlebitic changes.  He has a recent triangular 3/4 cm dry laceration on left mid lateral lower extremity.  Assessment:  Actinic keratoses.  Chronic edema.  Recent skin tear left lateral leg without signs of infection. He has lost considerable edema on the diuretics and is showing no sign of low potassium.  Plan:  Return one month to recheck the skin. Recheck potassium in a month  I asked Keith Ellis to elevate the legs while asleep using a pillow.  He will simply wash the wound with soap and water and watch for infection.  Elvina Sidle, MD

## 2012-06-19 NOTE — Telephone Encounter (Signed)
This was done.

## 2012-06-22 ENCOUNTER — Encounter (HOSPITAL_COMMUNITY): Payer: Self-pay | Admitting: Family Medicine

## 2012-06-22 ENCOUNTER — Emergency Department (HOSPITAL_COMMUNITY)
Admission: EM | Admit: 2012-06-22 | Discharge: 2012-06-22 | Disposition: A | Discharge status: Home / Self Care | Payer: Medicare Other | Attending: Emergency Medicine | Admitting: Emergency Medicine

## 2012-06-22 DIAGNOSIS — Z79899 Other long term (current) drug therapy: Secondary | ICD-10-CM | POA: Insufficient documentation

## 2012-06-22 DIAGNOSIS — Z8639 Personal history of other endocrine, nutritional and metabolic disease: Secondary | ICD-10-CM | POA: Insufficient documentation

## 2012-06-22 DIAGNOSIS — Z87448 Personal history of other diseases of urinary system: Secondary | ICD-10-CM | POA: Insufficient documentation

## 2012-06-22 DIAGNOSIS — I10 Essential (primary) hypertension: Secondary | ICD-10-CM | POA: Insufficient documentation

## 2012-06-22 DIAGNOSIS — Z7901 Long term (current) use of anticoagulants: Secondary | ICD-10-CM | POA: Insufficient documentation

## 2012-06-22 DIAGNOSIS — Z8669 Personal history of other diseases of the nervous system and sense organs: Secondary | ICD-10-CM | POA: Insufficient documentation

## 2012-06-22 DIAGNOSIS — R6889 Other general symptoms and signs: Secondary | ICD-10-CM | POA: Insufficient documentation

## 2012-06-22 DIAGNOSIS — Z862 Personal history of diseases of the blood and blood-forming organs and certain disorders involving the immune mechanism: Secondary | ICD-10-CM | POA: Insufficient documentation

## 2012-06-22 DIAGNOSIS — K219 Gastro-esophageal reflux disease without esophagitis: Secondary | ICD-10-CM | POA: Insufficient documentation

## 2012-06-22 DIAGNOSIS — R0989 Other specified symptoms and signs involving the circulatory and respiratory systems: Secondary | ICD-10-CM

## 2012-06-22 DIAGNOSIS — I2510 Atherosclerotic heart disease of native coronary artery without angina pectoris: Secondary | ICD-10-CM | POA: Insufficient documentation

## 2012-06-22 DIAGNOSIS — Z87891 Personal history of nicotine dependence: Secondary | ICD-10-CM | POA: Insufficient documentation

## 2012-06-22 NOTE — ED Provider Notes (Signed)
History     CSN: [id number]  Arrival date & time 06/22/12  0756   None     Chief Complaint  Patient presents with  . food stuck in throat     (Consider location/radiation/quality/duration/timing/severity/associated sxs/prior treatment) HPI Comments: 79 year old male with PMHx of orthopnea, GERD, HTN, LAD stenosis, a-flutter (anticoagulated with Eliquis), and lower extremity edema presents today complaining of difficulty swallowing since last night. Pt states he ate a steak dinner and felt a piece get stuck in his throat. He has been spitting up since then with a sensation that a piece of steak is still in his throat, pointing to his adam's apple. Pt states he states like he was choking all night, vomiting "more than 10 times," and was afraid to go to sleep. Pt states he did try to drink some water this morning, and about 5 minutes later it came back up in the form of a frothy sputum. Pt denies any pain, hemoptysis, coughing, difficulty breathing, chest pain, syncope. Pt states he feels the condition is unchanged since yesterday. Pt took no interventions.   Patient is a 79 y.o. male presenting with foreign body swallowed.  Swallowed Foreign Body Associated symptoms include vomiting. Pertinent negatives include no abdominal pain, chest pain, coughing, diaphoresis, fever, headaches, nausea, neck pain, numbness, rash, sore throat or weakness. The symptoms are aggravated by drinking. He has tried nothing for the symptoms.    Past Medical History  Diagnosis Date  . GERD (gastroesophageal reflux disease)   . Hypertension   . Blind     Right enucleation  . Hearing loss   . Coronary artery disease     Minimal LAD stenosis 2007  . BPH (benign prostatic hypertrophy)   . Hyperlipidemia   . Varicose vein   . Peripheral neuropathy   . Tremor, essential   . Aortic sclerosis     2008  . Aortic stenosis     Mild, echo, January, 2014  . Atrial flutter     December, 2013  . Chronic  anticoagulation     Apixaban started December 27, 2011  . Edema     December, 2013    Past Surgical History  Procedure Laterality Date  . Eye surgery    . Lung surgery      Benign lump removed    Family History  Problem Relation Age of Onset  . Cancer Father     Rectal  . Cancer Mother     Breast    History  Substance Use Topics  . Smoking status: Former Smoker    Types: Cigarettes    Quit date: 07/20/1975  . Smokeless tobacco: Never Used  . Alcohol Use: No      Review of Systems  Constitutional: Negative for fever and diaphoresis.  HENT: Positive for trouble swallowing. Negative for sore throat, drooling, neck pain and neck stiffness.   Eyes: Negative for visual disturbance.  Respiratory: Negative for apnea, cough, chest tightness and shortness of breath.   Cardiovascular: Negative for chest pain and palpitations.  Gastrointestinal: Positive for vomiting. Negative for nausea, abdominal pain, diarrhea and constipation.  Genitourinary: Negative for dysuria.  Musculoskeletal: Negative for gait problem.  Skin: Negative for rash.  Neurological: Negative for dizziness, weakness, light-headedness, numbness and headaches.    Allergies  Menthol  Home Medications   Current Outpatient Rx  Name  Route  Sig  Dispense  Refill  . apixaban (ELIQUIS) 5 MG TABS tablet   Oral   Take 1  tablet (5 mg total) by mouth 2 (two) times daily.   60 tablet   6   . Cod Liver Oil 1000 MG CAPS   Oral   Take 1 capsule by mouth daily.           Marland Kitchen GARLIC PO   Oral   Take by mouth daily.         . metolazone (ZAROXOLYN) 2.5 MG tablet   Oral   Take 2.5 mg by mouth once. 1 tab once a week         . omeprazole (PRILOSEC) 20 MG capsule   Oral   Take 1 capsule (20 mg total) by mouth daily.   90 capsule   3   . potassium chloride SA (K-DUR,KLOR-CON) 20 MEQ tablet      Take 2 TABLETS BY MOUTH TWICE A DAY.   120 tablet   6   . torsemide (DEMADEX) 20 MG tablet   Oral    Take 20 mg by mouth daily. Takes 2 tablets once daily         . ALPRAZolam (XANAX) 0.25 MG tablet   Oral   Take 1 tablet (0.25 mg total) by mouth at bedtime as needed.   90 tablet   1   . doxazosin (CARDURA) 2 MG tablet   Oral   Take 1 tablet (2 mg total) by mouth at bedtime. Needs office visit. Final notice.   90 tablet   3   . Multiple Vitamins-Minerals (MULTIVITAMINS THER. W/MINERALS) TABS   Oral   Take 1 tablet by mouth daily.             BP 156/56  Pulse 54  Temp(Src) 98.3 F (36.8 C)  Resp 18  SpO2 99%  Physical Exam  Nursing note and vitals reviewed. Constitutional: He is oriented to person, place, and time. He appears well-developed and well-nourished. No distress.  HENT:  Head: Normocephalic and atraumatic.  Eyes: Conjunctivae and EOM are normal.  Neck: Normal range of motion. Neck supple.  No meningeal signs  Cardiovascular: Normal rate, regular rhythm and normal heart sounds.  Exam reveals no gallop and no friction rub.   No murmur heard. Pt not bradycardic on exam. HR 60  Pulmonary/Chest: Effort normal and breath sounds normal. No respiratory distress. He has no wheezes. He has no rales. He exhibits no tenderness.  Abdominal: Soft. Bowel sounds are normal. He exhibits no distension. There is no tenderness. There is no rebound and no guarding.  Musculoskeletal: Normal range of motion. He exhibits edema. He exhibits no tenderness.  +1 edema bilateral lower extremities  Neurological: He is alert and oriented to person, place, and time. No cranial nerve deficit.  Skin: Skin is warm and dry. He is not diaphoretic. No erythema.  Psychiatric: He has a normal mood and affect.    ED Course  Procedures (including critical care time)  Labs Reviewed - No data to display No results found.   1. Foreign body sensation in throat       MDM  Pt states that within 5 minutes the water came back up this morning. After drinking some cola here in the ED, pt was  able to keep the soda down for 20 minutes. Pt did state once or twice that, "it feels like it wants to come up," but pt did not regurgitate for about an hour. Even then, no soda was regurgitated. Sputum was the same white frothy consistency the pt had described earlier. No cola-colored  sputum. Pt is not endorsing any abdominal pain. Airway is patent. Pt is well-appearing and in no acute distress. Pt case discussed with Dr. Karma Ganja who agrees at this time there is no evidence of an acute emergency medical condition and the patient appears stable for discharge with appropriate outpatient follow up with Dr. Elnoria Howard first thing Monday morning. Diagnosis and plan to call Dr. Elnoria Howard was discussed with patient. Return precaution discussed. Pt verbalizes understanding and is agreeable to discharge.   Glade Nurse, PA-C 06/22/12 1657

## 2012-06-22 NOTE — ED Notes (Signed)
Per pt sts was eating some steak last night and food got stuck in his throat. sts he thinks one piece has come up. sts unable to swallow water without it coming back up. Airway intact. sts hx of same but not this bad.

## 2012-06-23 ENCOUNTER — Ambulatory Visit (HOSPITAL_COMMUNITY)
Admission: RE | Admit: 2012-06-23 | Discharge: 2012-06-23 | Disposition: A | Discharge status: Home / Self Care | Payer: Medicare Other | Source: Ambulatory Visit | Attending: Gastroenterology | Admitting: Gastroenterology

## 2012-06-23 ENCOUNTER — Encounter (HOSPITAL_COMMUNITY)
Admission: RE | Disposition: A | Discharge status: Home / Self Care | Payer: Self-pay | Source: Ambulatory Visit | Attending: Gastroenterology

## 2012-06-23 ENCOUNTER — Encounter (HOSPITAL_COMMUNITY): Payer: Self-pay | Admitting: *Deleted

## 2012-06-23 ENCOUNTER — Telehealth: Payer: Self-pay | Admitting: Gastroenterology

## 2012-06-23 ENCOUNTER — Encounter: Payer: Self-pay | Admitting: Gastroenterology

## 2012-06-23 ENCOUNTER — Ambulatory Visit (INDEPENDENT_AMBULATORY_CARE_PROVIDER_SITE_OTHER): Payer: Medicare Other | Admitting: Physician Assistant

## 2012-06-23 ENCOUNTER — Encounter: Payer: Self-pay | Admitting: Physician Assistant

## 2012-06-23 VITALS — BP 138/64 | HR 70 | Ht 65.0 in | Wt 177.0 lb

## 2012-06-23 DIAGNOSIS — R1310 Dysphagia, unspecified: Secondary | ICD-10-CM

## 2012-06-23 DIAGNOSIS — Z5189 Encounter for other specified aftercare: Secondary | ICD-10-CM

## 2012-06-23 DIAGNOSIS — I4892 Unspecified atrial flutter: Secondary | ICD-10-CM | POA: Insufficient documentation

## 2012-06-23 DIAGNOSIS — T18108A Unspecified foreign body in esophagus causing other injury, initial encounter: Secondary | ICD-10-CM | POA: Insufficient documentation

## 2012-06-23 DIAGNOSIS — E785 Hyperlipidemia, unspecified: Secondary | ICD-10-CM | POA: Insufficient documentation

## 2012-06-23 DIAGNOSIS — K219 Gastro-esophageal reflux disease without esophagitis: Secondary | ICD-10-CM

## 2012-06-23 DIAGNOSIS — I359 Nonrheumatic aortic valve disorder, unspecified: Secondary | ICD-10-CM | POA: Insufficient documentation

## 2012-06-23 DIAGNOSIS — I10 Essential (primary) hypertension: Secondary | ICD-10-CM | POA: Insufficient documentation

## 2012-06-23 DIAGNOSIS — T18128D Food in esophagus causing other injury, subsequent encounter: Secondary | ICD-10-CM

## 2012-06-23 DIAGNOSIS — K2289 Other specified disease of esophagus: Secondary | ICD-10-CM | POA: Insufficient documentation

## 2012-06-23 DIAGNOSIS — Z79899 Other long term (current) drug therapy: Secondary | ICD-10-CM | POA: Insufficient documentation

## 2012-06-23 DIAGNOSIS — IMO0002 Reserved for concepts with insufficient information to code with codable children: Secondary | ICD-10-CM | POA: Insufficient documentation

## 2012-06-23 DIAGNOSIS — R1319 Other dysphagia: Secondary | ICD-10-CM

## 2012-06-23 DIAGNOSIS — Z7901 Long term (current) use of anticoagulants: Secondary | ICD-10-CM | POA: Insufficient documentation

## 2012-06-23 DIAGNOSIS — I2510 Atherosclerotic heart disease of native coronary artery without angina pectoris: Secondary | ICD-10-CM | POA: Insufficient documentation

## 2012-06-23 DIAGNOSIS — K449 Diaphragmatic hernia without obstruction or gangrene: Secondary | ICD-10-CM | POA: Insufficient documentation

## 2012-06-23 DIAGNOSIS — K228 Other specified diseases of esophagus: Secondary | ICD-10-CM | POA: Insufficient documentation

## 2012-06-23 HISTORY — PX: ESOPHAGOGASTRODUODENOSCOPY: SHX5428

## 2012-06-23 SURGERY — EGD (ESOPHAGOGASTRODUODENOSCOPY)
Anesthesia: Moderate Sedation

## 2012-06-23 MED ORDER — SODIUM CHLORIDE 0.9 % IV SOLN
INTRAVENOUS | Status: DC
  Administered 2012-06-23: 500 mL via INTRAVENOUS

## 2012-06-23 MED ORDER — MIDAZOLAM HCL 10 MG/2ML IJ SOLN
INTRAMUSCULAR | Status: AC
  Filled 2012-06-23: qty 2

## 2012-06-23 MED ORDER — OMEPRAZOLE 20 MG PO CPDR: 20.0000 mg | DELAYED_RELEASE_CAPSULE | Freq: Every day | ORAL | Status: DC

## 2012-06-23 MED ORDER — FENTANYL CITRATE 0.05 MG/ML IJ SOLN
INTRAMUSCULAR | Status: DC | PRN
  Administered 2012-06-23 (×2): 25 ug via INTRAVENOUS

## 2012-06-23 MED ORDER — BUTAMBEN-TETRACAINE-BENZOCAINE 2-2-14 % EX AERO
INHALATION_SPRAY | CUTANEOUS | Status: DC | PRN
  Administered 2012-06-23: 2 via TOPICAL

## 2012-06-23 MED ORDER — FENTANYL CITRATE 0.05 MG/ML IJ SOLN
INTRAMUSCULAR | Status: AC
  Filled 2012-06-23: qty 2

## 2012-06-23 MED ORDER — MIDAZOLAM HCL 10 MG/2ML IJ SOLN
INTRAMUSCULAR | Status: DC | PRN
  Administered 2012-06-23 (×2): 2 mg via INTRAVENOUS

## 2012-06-23 NOTE — Patient Instructions (Addendum)
You have been scheduled for an endoscopy with propofol. Please follow written instructions given to you at your visit today. If you use inhalers (even only as needed), please bring them with you on the day of your procedure. Your physician has requested that you go to www.[email or web address] and enter the access code given to you at your visit today. This web site gives a general overview about your procedure. However, you should still follow specific instructions given to you by our office regarding your preparation for the procedure.   DO NOT HAVE ANY food or DRINK until after your procedure.                                               We are excited to introduce MyChart, a new best-in-class service that provides you online access to important information in your electronic medical record. We want to make it easier for you to view your health information - all in one secure location - when and where you need it. We expect MyChart will enhance the quality of care and service we provide.  When you register for MyChart, you can:    View your test results.    Request appointments and receive appointment reminders via email.    Request medication renewals.    View your medical history, allergies, medications and immunizations.    Communicate with your physician's office through a password-protected site.    Conveniently print information such as your medication lists.  To find out if MyChart is right for you, please talk to a member of our clinical staff today. We will gladly answer your questions about this free health and wellness tool.  If you are age 52 or older and want a member of your family to have access to your record, you must provide written consent by completing a proxy form available at our office. Please speak to our clinical staff about guidelines regarding accounts for patients younger than age 56.  As you activate your MyChart account and need any technical assistance, please  call the MyChart technical support line at [phone]-CHART [phone]) or email your question to [email or web address]. If you email your question(s), please include your name, a return phone number and the best time to reach you.  If you have non-urgent health-related questions, you can send a message to our office through MyChart at [email or web address]. If you have a medical emergency, call 911.  Thank you for using MyChart as your new health and wellness resource!   MyChart licensed from [employer],  [id number]. Patents Pending.

## 2012-06-23 NOTE — Op Note (Signed)
[facility] [address] [place], [address]   ENDOSCOPY PROCEDURE REPORT  PATIENT: Keith Ellis, Keith Ellis.  MR#: [id number] BIRTHDATE: 02/26/1929 , 82  yrs. old GENDER: Male ENDOSCOPIST: Meryl Dare, MD, Clementeen Graham REFERRED BY:  [facility] de[facility] PROCEDURE DATE:  06/23/2012 PROCEDURE:  EGD w/ fb removal ASA CLASS:     Class III INDICATIONS:  Food impaction removal from esophagus.  Dysphagia for several year with acute dysphagia while eating steak. MEDICATIONS: medications were titrated to patient response per physician's verbal order, Fentanyl 75 mcg IV, and Versed 5 mg IV TOPICAL ANESTHETIC: Cetacaine Spray DESCRIPTION OF PROCEDURE: After the risks benefits and alternatives of the procedure were thoroughly explained, informed consent was obtained.  The Pentax Gastroscope [id number] endoscope was introduced through the mouth and advanced to the second portion of the duodenum  without limitations.  The instrument was slowly withdrawn as the mucosa was fully examined.  ESOPHAGUS: Erosions were found in the lower third of the esophagus with friable mucosa at site of food impaction.  Large meat impaction in the distal esophagus completely removed with the grasper and the Roth net.  The esophagus was otherwise normal. STOMACH: The mucosa and folds of the stomach appeared normal. DUODENUM: The duodenal mucosa showed no abnormalities in the bulb and second portion of the duodenum.  Retroflexed views revealed a small hiatal hernia.   The scope was then withdrawn from the patient and the procedure completed.  COMPLICATIONS: There were no complications. ENDOSCOPIC IMPRESSION: 1.   Erosions, friable mucosa in the distal esophagus 2.   Large meat impaction in the distal esophagus; removed 3.   Small hiatal hernia  RECOMMENDATIONS: 1.  Anti-reflux regimen long term 2.  PPI bid: Prilosec 20 mg OTC po bid for 4 weeks then qam long term 3.  Endoscopy with dilation off  anticoagulants   eSigned:  Meryl Dare, MD, [facility] 06/23/2012 4:51 PM  Gertie Fey Copy]

## 2012-06-23 NOTE — Telephone Encounter (Signed)
appt with Keith Ellis today at 130 pm The patient has been notified of this information and all questions answered.

## 2012-06-23 NOTE — Telephone Encounter (Signed)
Dr. Elnoria Howard passed word on this morning at sign out rounds that Keith Ellis was in ER last night with dysphagia and he needs to be seen here expiditiously.  He may have had previous procedures with [place] in past, I cannot find any documentation of that in epic however.  Can he get appt with extender, open MD spot or MD of the day today?

## 2012-06-23 NOTE — H&P (View-Only) (Signed)
Subjective:    Patient ID: Keith Ellis, male    DOB: 07/24/1929, 79 y.o.   MRN: [id number]  HPI  Keith Ellis is a pleasant 79-year-old white male referred after emergency room visit yesterday morning. He presented stating that he had TE and a steak for dinner on Saturday evening and on his second bite ALT as if a piece of meat became stuck. He says he was unable to eat or drink anything after that. Everything he tries to swallow eventually comes back up including his saliva. He was evaluated in the emergency room apparently was able to hold down a few sips of Coke and therefore discharged from the emergency room . All day yesterday and today he has not eaten  anything or had anything to drink. He has tried to swallow some water but it comes back up. He is holding an emesis basin and is spitting his saliva. He says he thinks he may have brought up a piece of meat . He  does not have any complaints of chest pain or abdominal pain. Patient has tried to take his meds but says they have come back up as well. He is on Eliquis  for atrial flutter and says the last 2 times he tried to swallow this  It came back up as well. Other medical problems include coronary artery disease, aortic stenosis, hypertension. Records show that he did have a very remote EGD with Dr. Medoff in 1990 and was found to have ulcerative esophagitis at that time, no stricture. He also had colonoscopy in 2002 by Dr. Medoff showing diverticulosis. Patient states that he has had at least one colonoscopy since, but is uncertain who did that. Patient also reports intermittent problems with dysphagia which she has had for quite a while. He says usually if he does stop eating food will go on down.    Review of Systems  HENT: Positive for trouble swallowing.   Eyes: Negative.   Respiratory: Negative.   Cardiovascular: Negative.   Gastrointestinal: Negative.   Endocrine: Negative.   Genitourinary: Negative.   Musculoskeletal: Negative.    Skin: Negative.   Allergic/Immunologic: Negative.   Neurological: Negative.   Hematological: Negative.   Psychiatric/Behavioral: Negative.    Outpatient Prescriptions Prior to Visit  Medication Sig Dispense Refill  . ALPRAZolam (XANAX) 0.25 MG tablet Take 1 tablet (0.25 mg total) by mouth at bedtime as needed.  90 tablet  1  . apixaban (ELIQUIS) 5 MG TABS tablet Take 1 tablet (5 mg total) by mouth 2 (two) times daily.  60 tablet  6  . Cod Liver Oil 1000 MG CAPS Take 1 capsule by mouth daily.        . doxazosin (CARDURA) 2 MG tablet Take 1 tablet (2 mg total) by mouth at bedtime. Needs office visit. Final notice.  90 tablet  3  . GARLIC PO Take by mouth daily.      . metolazone (ZAROXOLYN) 2.5 MG tablet Take 2.5 mg by mouth once. 1 tab once a week      . Multiple Vitamins-Minerals (MULTIVITAMINS THER. W/MINERALS) TABS Take 1 tablet by mouth daily.        . potassium chloride SA (K-DUR,KLOR-CON) 20 MEQ tablet Take 2 TABLETS BY MOUTH TWICE A DAY.  120 tablet  6  . torsemide (DEMADEX) 20 MG tablet Take 20 mg by mouth daily. Takes 2 tablets once daily      . omeprazole (PRILOSEC) 20 MG capsule Take 1 capsule (20 mg total) by   mouth daily.  90 capsule  3   No facility-administered medications prior to visit.   Allergies  Allergen Reactions  . Menthol Hives   Patient Active Problem List   Diagnosis Date Noted  . Aortic stenosis   . Atrial flutter   . Chronic anticoagulation   . Edema   . Blind left eye 02/08/2011  . Hearing impaired 02/08/2011  . BPH (benign prostatic hyperplasia) 02/08/2011  . Hyperlipidemia 02/08/2011  . GERD (gastroesophageal reflux disease) 02/08/2011  . Peripheral neuropathy 02/08/2011  . Obese 07/20/2010  . Hypertension   . Coronary artery disease    History  Substance Use Topics  . Smoking status: Former Smoker    Types: Cigarettes    Quit date: 07/20/1975  . Smokeless tobacco: Never Used  . Alcohol Use: No   family history includes Cancer in his  father and mother.     Objective:   Physical Exam  well-developed elderly white male in no acute distress blood pressure 138/64 pulse 70 height 5 foot 5 weight 177. HEENT; nontraumatic normocephalic EOMI PERRLA sclera anicteric,Neck; Supple no JVD, Cardiovascular; regular rate and rhythm with S1-S2 no murmur or gallop, Pulmonary; clear bilaterally, Abdomen; soft nontender nondistended bowel sounds are present no palpable mass or hepatosplenomegaly, Rectal; exam not done, Extremities; no clubbing cyanosis or edema skin warm and dry, Psych; mood and affect normal and appropriate.        Assessment & Plan:  #1  79-year-old white male with food impaction x36 hours-probable underlying esophageal stricture #2 chronic anticoagulation- on Eliquis #3 history of atrial flutter #4 coronary artery disease and aortic stenosis #5 hypertension  Plan; Patient is scheduled for emergency upper endoscopy with Dr. Stark at [place] [facility] this afternoon, anticipate he will need second procedure off Eliquis  For dilation Procedure was discussed in detail with the patient and his wife and they are agreeable to proceed Will increase his omeprazole to 40 mg by mouth daily 

## 2012-06-23 NOTE — Progress Notes (Signed)
Reviewed and agree with management plan. Urgent EGD today at [facility].  Venita Lick. Russella Dar, MD [facility]

## 2012-06-23 NOTE — Interval H&P Note (Signed)
History and Physical Interval Note:  06/23/2012 3:32 PM  Keith Ellis  has presented today for surgery, with the diagnosis of dysphagia, possible food impaction  The various methods of treatment have been discussed with the patient and family. After consideration of risks, benefits and other options for treatment, the patient has consented to  Procedure(s): ESOPHAGOGASTRODUODENOSCOPY (EGD) (N/A) as a surgical intervention .  The patient's history has been reviewed, patient examined, no change in status, stable for surgery.  I have reviewed the patient's chart and labs.  Questions were answered to the patient's satisfaction.     Venita Lick. Russella Dar MD

## 2012-06-23 NOTE — ED Provider Notes (Signed)
Medical screening examination/treatment/procedure(s) were conducted as a shared visit with non-physician practitioner(s) and myself.  I personally evaluated the patient during the encounter  Pt seen and evaluated, pt tolerating po fluids, still has foreign body sensation.  PA has d/w GI, who will see patient as an outpatient tomorrow morning.    Ethelda Chick, MD 06/23/12 [phone]

## 2012-06-23 NOTE — Progress Notes (Signed)
Subjective:    Patient ID: Keith Ellis, male    DOB: November 25, 1929, 79 y.o.   MRN: [id number]  HPI  Keith Ellis is a pleasant 79 year old white male referred after emergency room visit yesterday morning. He presented stating that he had TE and a steak for dinner on Saturday evening and on his second bite ALT as if a piece of meat became stuck. He says he was unable to eat or drink anything after that. Everything he tries to swallow eventually comes back up including his saliva. He was evaluated in the emergency room apparently was able to hold down a few sips of Coke and therefore discharged from the emergency room . All day yesterday and today he has not eaten  anything or had anything to drink. He has tried to swallow some water but it comes back up. He is holding an emesis basin and is spitting his saliva. He says he thinks he may have brought up a piece of meat . He  does not have any complaints of chest pain or abdominal pain. Patient has tried to take his meds but says they have come back up as well. He is on Eliquis  for atrial flutter and says the last 2 times he tried to swallow this  It came back up as well. Other medical problems include coronary artery disease, aortic stenosis, hypertension. Records show that he did have a very remote EGD with Dr. Kinnie Scales in 1990 and was found to have ulcerative esophagitis at that time, no stricture. He also had colonoscopy in 2002 by Dr. Kinnie Scales showing diverticulosis. Patient states that he has had at least one colonoscopy since, but is uncertain who did that. Patient also reports intermittent problems with dysphagia which she has had for quite a while. He says usually if he does stop eating food will go on down.    Review of Systems  HENT: Positive for trouble swallowing.   Eyes: Negative.   Respiratory: Negative.   Cardiovascular: Negative.   Gastrointestinal: Negative.   Endocrine: Negative.   Genitourinary: Negative.   Musculoskeletal: Negative.    Skin: Negative.   Allergic/Immunologic: Negative.   Neurological: Negative.   Hematological: Negative.   Psychiatric/Behavioral: Negative.    Outpatient Prescriptions Prior to Visit  Medication Sig Dispense Refill  . ALPRAZolam (XANAX) 0.25 MG tablet Take 1 tablet (0.25 mg total) by mouth at bedtime as needed.  90 tablet  1  . apixaban (ELIQUIS) 5 MG TABS tablet Take 1 tablet (5 mg total) by mouth 2 (two) times daily.  60 tablet  6  . Cod Liver Oil 1000 MG CAPS Take 1 capsule by mouth daily.        Marland Kitchen doxazosin (CARDURA) 2 MG tablet Take 1 tablet (2 mg total) by mouth at bedtime. Needs office visit. Final notice.  90 tablet  3  . GARLIC PO Take by mouth daily.      . metolazone (ZAROXOLYN) 2.5 MG tablet Take 2.5 mg by mouth once. 1 tab once a week      . Multiple Vitamins-Minerals (MULTIVITAMINS THER. W/MINERALS) TABS Take 1 tablet by mouth daily.        . potassium chloride SA (K-DUR,KLOR-CON) 20 MEQ tablet Take 2 TABLETS BY MOUTH TWICE A DAY.  120 tablet  6  . torsemide (DEMADEX) 20 MG tablet Take 20 mg by mouth daily. Takes 2 tablets once daily      . omeprazole (PRILOSEC) 20 MG capsule Take 1 capsule (20 mg total) by  mouth daily.  90 capsule  3   No facility-administered medications prior to visit.   Allergies  Allergen Reactions  . Menthol Hives   Patient Active Problem List   Diagnosis Date Noted  . Aortic stenosis   . Atrial flutter   . Chronic anticoagulation   . Edema   . Blind left eye 02/08/2011  . Hearing impaired 02/08/2011  . BPH (benign prostatic hyperplasia) 02/08/2011  . Hyperlipidemia 02/08/2011  . GERD (gastroesophageal reflux disease) 02/08/2011  . Peripheral neuropathy 02/08/2011  . Obese 07/20/2010  . Hypertension   . Coronary artery disease    History  Substance Use Topics  . Smoking status: Former Smoker    Types: Cigarettes    Quit date: 07/20/1975  . Smokeless tobacco: Never Used  . Alcohol Use: No   family history includes Cancer in his  father and mother.     Objective:   Physical Exam  well-developed elderly white male in no acute distress blood pressure 138/64 pulse 70 height 5 foot 5 weight 177. HEENT; nontraumatic normocephalic EOMI PERRLA sclera anicteric,Neck; Supple no JVD, Cardiovascular; regular rate and rhythm with S1-S2 no murmur or gallop, Pulmonary; clear bilaterally, Abdomen; soft nontender nondistended bowel sounds are present no palpable mass or hepatosplenomegaly, Rectal; exam not done, Extremities; no clubbing cyanosis or edema skin warm and dry, Psych; mood and affect normal and appropriate.        Assessment & Plan:  #52  79 year old white male with food impaction x36 hours-probable underlying esophageal stricture #2 chronic anticoagulation- on Eliquis #3 history of atrial flutter #4 coronary artery disease and aortic stenosis #5 hypertension  Plan; Patient is scheduled for emergency upper endoscopy with Dr. Russella Dar at [facility] this afternoon, anticipate he will need second procedure off Eliquis  For dilation Procedure was discussed in detail with the patient and his wife and they are agreeable to proceed Will increase his omeprazole to 40 mg by mouth daily

## 2012-06-24 ENCOUNTER — Telehealth: Payer: Self-pay | Admitting: Gastroenterology

## 2012-06-24 NOTE — Telephone Encounter (Signed)
Hold for two days prior.

## 2012-06-24 NOTE — Telephone Encounter (Signed)
Patient had a recent food impaction and needs an esophageal dilation.  He is currently on eliquis.  How long is he able to hold the eliquis for the procedure?

## 2012-06-25 ENCOUNTER — Telehealth: Payer: Self-pay | Admitting: Cardiology

## 2012-06-25 ENCOUNTER — Encounter (HOSPITAL_COMMUNITY): Payer: Self-pay | Admitting: Gastroenterology

## 2012-06-25 MED ORDER — POTASSIUM CHLORIDE ER 20 MEQ PO TBCR: 40.0000 meq | EXTENDED_RELEASE_TABLET | Freq: Two times a day (BID) | ORAL | Status: DC

## 2012-06-25 NOTE — Telephone Encounter (Signed)
Will forward information to Earma Reading, RN for her information.

## 2012-06-25 NOTE — Telephone Encounter (Signed)
Pt aware Medicare will not cover liquid or powder forms of KCL.  He will try the capsule form and call back if he continues to have problems

## 2012-06-25 NOTE — Telephone Encounter (Signed)
Left message for patient to call back  

## 2012-06-25 NOTE — Telephone Encounter (Signed)
New problem   Pt wants to change potassium pill to a powder or liquid because he's having problems swallowing the pill even after breaking the pill in half

## 2012-06-25 NOTE — Telephone Encounter (Signed)
Dr. Russella Dar is it ok to schedule the EGD/dil in the [facility] and what time frame?

## 2012-06-25 NOTE — Telephone Encounter (Signed)
He is OK for the [facility] for EGD/Savary dilation. I think Eliquis is a 2 days hold but please check with the CMAs as they have the protocol.

## 2012-06-25 NOTE — Telephone Encounter (Signed)
Left message for pt - unable to obtain approval for powder or liquid d/t medicare.  The only thing they have offered is for the pt to use capsules and sprinkle the contents on food.  Requested pt call back if he would like RX changed to capsules.

## 2012-06-26 ENCOUNTER — Telehealth: Payer: Self-pay | Admitting: Cardiology

## 2012-06-26 NOTE — Telephone Encounter (Signed)
Left message for patient to call back  

## 2012-06-26 NOTE — Telephone Encounter (Signed)
Capsules only come in 10 MEQ  Pt will have to take 4 capsules twice a day  Pharmacy aware and will prepare RX.   Pt aware

## 2012-06-26 NOTE — Telephone Encounter (Signed)
Patient scheduled for endo dil [facility] 07/16/12 , pre-visit 07/02/12 2:00

## 2012-06-26 NOTE — Telephone Encounter (Signed)
Follow Up    Pt following up on phone call from earlier. Please call.

## 2012-07-02 ENCOUNTER — Ambulatory Visit (AMBULATORY_SURGERY_CENTER): Payer: Medicare Other | Admitting: *Deleted

## 2012-07-02 VITALS — Ht 65.0 in | Wt 177.0 lb

## 2012-07-02 DIAGNOSIS — R1310 Dysphagia, unspecified: Secondary | ICD-10-CM

## 2012-07-02 DIAGNOSIS — K219 Gastro-esophageal reflux disease without esophagitis: Secondary | ICD-10-CM

## 2012-07-02 NOTE — Progress Notes (Signed)
No egg or soy allergy 

## 2012-07-03 ENCOUNTER — Telehealth: Payer: Self-pay | Admitting: Cardiology

## 2012-07-03 ENCOUNTER — Encounter: Payer: Self-pay | Admitting: Gastroenterology

## 2012-07-03 NOTE — Telephone Encounter (Signed)
New Problem:    Patient called in because he has lost 30 lbs since seeing Dr. Antoine Poche, is constipated, has had low BP for the past 4 days, and has been dizzier than usual.  Patient has an appointment in august but states that that is two months away.  Please call back.

## 2012-07-03 NOTE — Telephone Encounter (Signed)
Advised pt to contact pcp for sooner appt.

## 2012-07-05 ENCOUNTER — Ambulatory Visit (INDEPENDENT_AMBULATORY_CARE_PROVIDER_SITE_OTHER): Payer: Medicare Other | Admitting: Family Medicine

## 2012-07-05 VITALS — BP 122/60 | HR 51 | Temp 97.8°F | Resp 18 | Ht 67.5 in | Wt 181.0 lb

## 2012-07-05 DIAGNOSIS — E876 Hypokalemia: Secondary | ICD-10-CM

## 2012-07-05 DIAGNOSIS — R42 Dizziness and giddiness: Secondary | ICD-10-CM

## 2012-07-05 DIAGNOSIS — K5900 Constipation, unspecified: Secondary | ICD-10-CM

## 2012-07-05 LAB — COMPREHENSIVE METABOLIC PANEL
ALT: 11 U/L (ref 0–53)
AST: 21 U/L (ref 0–37)
Albumin: 4.1 g/dL (ref 3.5–5.2)
Alkaline Phosphatase: 100 U/L (ref 39–117)
BUN: 31 mg/dL — ABNORMAL HIGH (ref 6–23)
CO2: 30 mEq/L (ref 19–32)
Calcium: 9.3 mg/dL (ref 8.4–10.5)
Chloride: 99 mEq/L (ref 96–112)
Creat: 1.14 mg/dL (ref 0.50–1.35)
Glucose, Bld: 102 mg/dL — ABNORMAL HIGH (ref 70–99)
Potassium: 3.6 mEq/L (ref 3.5–5.3)
Sodium: 139 mEq/L (ref 135–145)
Total Bilirubin: 2 mg/dL — ABNORMAL HIGH (ref 0.3–1.2)
Total Protein: 6.8 g/dL (ref 6.0–8.3)

## 2012-07-05 LAB — POCT CBC
Granulocyte percent: 62.5 %G (ref 37–80)
HCT, POC: 47.7 % (ref 43.5–53.7)
Hemoglobin: 15.7 g/dL (ref 14.1–18.1)
Lymph, poc: 1.8 (ref 0.6–3.4)
MCH, POC: 30 pg (ref 27–31.2)
MCHC: 32.9 g/dL (ref 31.8–35.4)
MCV: 91.3 fL (ref 80–97)
MID (cbc): 0.5 (ref 0–0.9)
MPV: 9.8 fL (ref 0–99.8)
POC Granulocyte: 3.7 (ref 2–6.9)
POC LYMPH PERCENT: 29.6 %L (ref 10–50)
POC MID %: 7.9 %M (ref 0–12)
Platelet Count, POC: 148 10*3/uL (ref 142–424)
RBC: 5.23 M/uL (ref 4.69–6.13)
RDW, POC: 16.2 %
WBC: 6 10*3/uL (ref 4.6–10.2)

## 2012-07-05 NOTE — Progress Notes (Signed)
Keith Ellis is a pleasant 79 year old white male seen here after emergency room visit last Sunday morning. He presented stating that he had TE and a steak for dinner on Saturday evening and on his second bite ALT as if a piece of meat became stuck. He says he was unable to eat or drink anything after that. Everything he tries to swallow eventually comes back up including his saliva. He was evaluated in the emergency room apparently was able to hold down a few sips of Coke and therefore discharged from the emergency room . All day yesterday and today he has not eaten anything or had anything to drink. He has tried to swallow some water but it comes back up. He is holding an emesis basin and is spitting his saliva. He says he thinks he may have brought up a piece of meat . He does not have any complaints of chest pain or abdominal pain.  Patient has tried to take his meds but says they have come back up as well. He is on Eliquis for atrial flutter and says the last 2 times he tried to swallow this It came back up as well. Other medical problems include coronary artery disease, aortic stenosis, hypertension.  Records show that he did have a very remote EGD with Dr. Kinnie Scales in 1990 and was found to have ulcerative esophagitis at that time, no stricture. He also had colonoscopy in 2002 by Dr. Kinnie Scales showing diverticulosis. Patient states that he has had at least one colonoscopy since, but is uncertain who did that.  Patient also reports intermittent problems with dysphagia which she has had for quite a while. He says usually if he does stop eating food will go on down.  He is scheduled for esophageal dilation next week.  Notes back pain, constipation, dizziness  Objective:  NAD  Heart: Loud 3/6 murmur of aortic stenosis Chest clear Abdomen: soft, nontender, no mass or HSM  Assessment:  Overmedicated  Plan:  Stop the doxazasin and reduce the potassium to twice a day.  Signed,  Elvina Sidle, MD

## 2012-07-05 NOTE — Patient Instructions (Signed)
Stop the doxazasin Take only 1 klorchon twice daily with food

## 2012-07-16 ENCOUNTER — Ambulatory Visit (AMBULATORY_SURGERY_CENTER): Payer: Medicare Other | Admitting: Gastroenterology

## 2012-07-16 ENCOUNTER — Encounter: Payer: Self-pay | Admitting: Gastroenterology

## 2012-07-16 VITALS — BP 145/58 | HR 51 | Temp 97.3°F | Resp 32 | Ht 65.0 in | Wt 177.0 lb

## 2012-07-16 DIAGNOSIS — K219 Gastro-esophageal reflux disease without esophagitis: Secondary | ICD-10-CM

## 2012-07-16 DIAGNOSIS — R1310 Dysphagia, unspecified: Secondary | ICD-10-CM

## 2012-07-16 MED ORDER — SODIUM CHLORIDE 0.9 % IV SOLN: 500.0000 mL | INTRAVENOUS | Status: DC

## 2012-07-16 NOTE — Patient Instructions (Addendum)
YOU HAD AN ENDOSCOPIC PROCEDURE TODAY AT THE [facility]: Refer to the procedure report that was given to you for any specific questions about what was found during the examination.  If the procedure report does not answer your questions, please call your gastroenterologist to clarify.  If you requested that your care partner not be given the details of your procedure findings, then the procedure report has been included in a sealed envelope for you to review at your convenience later.  YOU SHOULD EXPECT: Some feelings of bloating in the abdomen. Passage of more gas than usual.  Walking can help get rid of the air that was put into your GI tract during the procedure and reduce the bloatingDIET: FOLLOW DILATION DIET! Drink plenty of fluids but you should avoid alcoholic beverages for 24 hours.  ACTIVITY: Your care partner should take you home directly after the procedure.  You should plan to take it easy, moving slowly for the rest of the day.  You can resume normal activity the day after the procedure however you should NOT DRIVE or use heavy machinery for 24 hours (because of the sedation medicines used during the test).    SYMPTOMS TO REPORT IMMEDIATELY: A gastroenterologist can be reached at any hour.  During normal business hours, 8:30 AM to 5:00 PM Monday through Friday, call [phone].  After hours and on weekends, please call the GI answering service at [phone] who will take a message and have the physician on call contact you.   Following upper endoscopy (EGD)  Vomiting of blood or coffee ground material  New chest pain or pain under the shoulder blades  Painful or persistently difficult swallowing  New shortness of breath  Fever of 100F or higher  Black, tarry-looking stools  FOLLOW UP: If any biopsies were taken you will be contacted by phone or by letter within the next 1-3 weeks.  Call your gastroenterologist if you have not heard about the biopsies in 3  weeks.  Our staff will call the home number listed on your records the next business day following your procedure to check on you and address any questions or concerns that you may have at that time regarding the information given to you following your procedure. This is a courtesy call and so if there is no answer at the home number and we have not heard from you through the emergency physician on call, we will assume that you have returned to your regular daily activities without incident.  SIGNATURES/CONFIDENTIALITY: You and/or your care partner have signed paperwork which will be entered into your electronic medical record.  These signatures attest to the fact that that the information above on your After Visit Summary has been reviewed and is understood.  Full responsibility of the confidentiality of this discharge information lies with you and/or your care-partner.  Restart Eliquis on 07-17-12  Follow dilation diet- see handout

## 2012-07-16 NOTE — Op Note (Signed)
[facility] [address] [place], [address]   ENDOSCOPY PROCEDURE REPORT  PATIENT: Keith Ellis, Keith Ellis.  MR#: [id number] BIRTHDATE: [birth year] , 82  yrs. old GENDER: Male ENDOSCOPIST: Meryl Dare, MD, [facility] REFERRED BY:  [facility] PROCEDURE DATE:  07/16/2012 PROCEDURE:  EGD, diagnostic and Savary dilation of esophagus ASA CLASS:     Class III INDICATIONS:  Dysphagia.   History of esophageal reflux. MEDICATIONS: MAC sedation, administered by CRNA and propofol (Diprivan) 150mg  IV TOPICAL ANESTHETIC: none DESCRIPTION OF PROCEDURE: After the risks benefits and alternatives of the procedure were thoroughly explained, informed consent was obtained.  The LB [id number] endoscope was introduced through the mouth and advanced to the second portion of the duodenum  without limitations.  The instrument was slowly withdrawn as the mucosa was fully examined.   ESOPHAGUS: The mucosa of the esophagus appeared normal. STOMACH: The mucosa and folds of the stomach appeared normal. DUODENUM: There was a subtle stenosis at the D1/D2 jucntion. The duodenal mucosa otherwise showed no abnormalities in the bulb and second portion of the duodenum.  Retroflexed views revealed a small hiatal hernia. A guidewire was place and  the scope was  withdrawn from the patient. A 17 mm dilator was passed with no resistance and no heme noted. The dilator and guidewire were removed from the patient and the procedure completed.  COMPLICATIONS: There were no complications.  ENDOSCOPIC IMPRESSION: 1.   Mild duodenal bulb stenosis 2.   Small hiatal hernia  RECOMMENDATIONS: 1.  Anti-reflux regimen long term 2.  Continue PPI daily long term 3.  Post dilation instructions 4.  Follow up with PCP, GI follow as needed   eSigned:  Meryl Dare, MD, [facility] 07/16/2012 3:10 PM

## 2012-07-16 NOTE — Progress Notes (Signed)
Patient did not experience any of the following events: a burn prior to discharge; a fall within the facility; wrong site/side/patient/procedure/implant event; or a hospital transfer or hospital admission upon discharge from the facility. (G8907) Patient did not have preoperative order for IV antibiotic SSI prophylaxis. (G8918)  

## 2012-07-16 NOTE — Progress Notes (Signed)
Called to room to assist during endoscopic procedure.  Patient ID and intended procedure confirmed with present staff. Received instructions for my participation in the procedure from the performing physician.  

## 2012-07-17 ENCOUNTER — Telehealth: Payer: Self-pay | Admitting: *Deleted

## 2012-07-17 ENCOUNTER — Telehealth: Payer: Self-pay

## 2012-07-17 NOTE — Telephone Encounter (Signed)
Patient states that his insurance company will not cover his torsemide medication until the 21st. Patent can get the medicine now but he will have to pay $75 out of pocket. Patient wants to know if he can get something else to replace it until he can get it on the 21st. Told patient to call insurance company if he needs further information coverage guidelines.  [phone]

## 2012-07-17 NOTE — Telephone Encounter (Signed)
  Follow up Call-  Call back number 07/16/2012  Post procedure Call Back phone  # [phone]  Permission to leave phone message Yes     Patient questions:  Do you have a fever, pain , or abdominal swelling? no Pain Score  0 *  Have you tolerated food without any problems? yes  Have you been able to return to your normal activities? yes  Do you have any questions about your discharge instructions: Diet   no Medications  no Follow up visit  no  Do you have questions or concerns about your Care? no  Actions: * If pain score is 4 or above: No action needed, pain <4.

## 2012-07-17 NOTE — Telephone Encounter (Signed)
Should this go to Dr Myrtis Ser?

## 2012-07-18 ENCOUNTER — Telehealth: Payer: Self-pay

## 2012-07-18 NOTE — Telephone Encounter (Signed)
PATIENT IS REQUESTING A NEW PRESCRIPTION BE WRITTEN FOR DEMDEX 20MG  AND FAXED TO HIS PHARMACY. THE INSURANCE COMPANY WILL NOT PAY UNTIL THE INCREASE IN HIS DOSAGE IS UPDATED. DR. Milus Glazier IS THE DOCTOR WHO INCREASED IT TO 20MG  AND HE TAKES 1 TABLET 2 TIMES A DAY. PLEASE CALL HIM WHEN IT HAS BEEN DONE. BEST PHONE [phone]  PHARMACY CHOICE IS [facility] ON [address].  MBC

## 2012-07-18 NOTE — Telephone Encounter (Signed)
Please take care of this - a paper Rx was written and I am unsure how to help

## 2012-07-18 NOTE — Telephone Encounter (Signed)
I believe so - I do not think that we Rx this medication.  We might want to call the pharmacy 1st and see what the problem is - I wonder if pt got a handwritten Rx?

## 2012-07-18 NOTE — Telephone Encounter (Signed)
Yes he got a hand written Rx from Dr L, message sent to him, to you FYI

## 2012-07-19 ENCOUNTER — Other Ambulatory Visit: Payer: Self-pay | Admitting: Family Medicine

## 2012-07-19 DIAGNOSIS — R609 Edema, unspecified: Secondary | ICD-10-CM

## 2012-07-19 MED ORDER — TORSEMIDE 20 MG PO TABS: 20.0000 mg | ORAL_TABLET | Freq: Every day | ORAL | Status: DC

## 2012-07-19 NOTE — Telephone Encounter (Signed)
rx sent to [facility].

## 2012-07-24 ENCOUNTER — Encounter: Payer: Self-pay | Admitting: Family Medicine

## 2012-07-24 ENCOUNTER — Ambulatory Visit (INDEPENDENT_AMBULATORY_CARE_PROVIDER_SITE_OTHER): Payer: Medicare Other | Admitting: Family Medicine

## 2012-07-24 VITALS — BP 154/44 | HR 48 | Temp 98.1°F | Resp 18 | Ht 66.5 in | Wt 188.0 lb

## 2012-07-24 DIAGNOSIS — K219 Gastro-esophageal reflux disease without esophagitis: Secondary | ICD-10-CM

## 2012-07-24 DIAGNOSIS — G4700 Insomnia, unspecified: Secondary | ICD-10-CM

## 2012-07-24 DIAGNOSIS — F410 Panic disorder [episodic paroxysmal anxiety] without agoraphobia: Secondary | ICD-10-CM

## 2012-07-24 DIAGNOSIS — L03119 Cellulitis of unspecified part of limb: Secondary | ICD-10-CM

## 2012-07-24 DIAGNOSIS — R609 Edema, unspecified: Secondary | ICD-10-CM

## 2012-07-24 DIAGNOSIS — L02419 Cutaneous abscess of limb, unspecified: Secondary | ICD-10-CM

## 2012-07-24 MED ORDER — APIXABAN 5 MG PO TABS: 5.0000 mg | ORAL_TABLET | Freq: Two times a day (BID) | ORAL | Status: DC

## 2012-07-24 MED ORDER — METOLAZONE 2.5 MG PO TABS: 2.5000 mg | ORAL_TABLET | Freq: Once | ORAL | Status: DC

## 2012-07-24 MED ORDER — POTASSIUM CHLORIDE ER 10 MEQ PO TBCR: 10.0000 meq | EXTENDED_RELEASE_TABLET | Freq: Two times a day (BID) | ORAL | Status: DC

## 2012-07-24 MED ORDER — TORSEMIDE 20 MG PO TABS: 20.0000 mg | ORAL_TABLET | Freq: Two times a day (BID) | ORAL | Status: DC

## 2012-07-24 MED ORDER — DOXYCYCLINE HYCLATE 100 MG PO TABS: 100.0000 mg | ORAL_TABLET | Freq: Two times a day (BID) | ORAL | Status: DC

## 2012-07-24 MED ORDER — OMEPRAZOLE 20 MG PO CPDR: 20.0000 mg | DELAYED_RELEASE_CAPSULE | Freq: Two times a day (BID) | ORAL | Status: DC

## 2012-07-24 MED ORDER — ALPRAZOLAM 0.25 MG PO TABS: 0.2500 mg | ORAL_TABLET | Freq: Every evening | ORAL | Status: DC | PRN

## 2012-07-24 NOTE — Progress Notes (Addendum)
79 yo gentleman with recent esophageal dilation by Dr. Russella Dar.  He has been increased to bid prilosec.  Also has several other problems 1. 1 cm scabbed non healing ulcer left leg with surrounding erythema 2. Sore nostrils 3. Non healing right sideburn lesion    Objective:  NAD Chest:  Clear Heart:  Slow (50 bpm), regular, 2/6 systolic murmur best heard at RSB Ext:  2+ diffuse edema, nonhealing 1 cm ulcer left lower leg Skin:  3 mm crusting right facial lesion in anterior sideburn Nose: crusting scaly nasal lesion bilaterally at the opening  Results for orders placed in visit on 07/05/12  COMPREHENSIVE METABOLIC PANEL      Result Value Range   Sodium 139  135 - 145 mEq/L   Potassium 3.6  3.5 - 5.3 mEq/L   Chloride 99  96 - 112 mEq/L   CO2 30  19 - 32 mEq/L   Glucose, Bld 102 (*) 70 - 99 mg/dL   BUN 31 (*) 6 - 23 mg/dL   Creat 1.61  0.96 - 0.45 mg/dL   Total Bilirubin 2.0 (*) 0.3 - 1.2 mg/dL   Alkaline Phosphatase 100  39 - 117 U/L   AST 21  0 - 37 U/L   ALT 11  0 - 53 U/L   Total Protein 6.8  6.0 - 8.3 g/dL   Albumin 4.1  3.5 - 5.2 g/dL   Calcium 9.3  8.4 - 40.9 mg/dL  POCT CBC      Result Value Range   WBC 6.0  4.6 - 10.2 K/uL   Lymph, poc 1.8  0.6 - 3.4   POC LYMPH PERCENT 29.6  10 - 50 %L   MID (cbc) 0.5  0 - 0.9   POC MID % 7.9  0 - 12 %M   POC Granulocyte 3.7  2 - 6.9   Granulocyte percent 62.5  37 - 80 %G   RBC 5.23  4.69 - 6.13 M/uL   Hemoglobin 15.7  14.1 - 18.1 g/dL   HCT, POC 81.1  91.4 - 53.7 %   MCV 91.3  80 - 97 fL   MCH, POC 30.0  27 - 31.2 pg   MCHC 32.9  31.8 - 35.4 g/dL   RDW, POC 78.2     Platelet Count, POC 148  142 - 424 K/uL   MPV 9.8  0 - 99.8 fL   Edema - Plan: torsemide (DEMADEX) 20 MG tablet, metolazone (ZAROXOLYN) 2.5 MG tablet, apixaban (ELIQUIS) 5 MG TABS tablet  GERD (gastroesophageal reflux disease) - Plan: omeprazole (PRILOSEC) 20 MG capsule  Insomnia - Plan: ALPRAZolam (XANAX) 0.25 MG tablet  Panic disorder - Plan: ALPRAZolam  (XANAX) 0.25 MG tablet  Cellulitis and abscess of leg - Plan: doxycycline (VIBRA-TABS) 100 MG tablet  Signed, Elvina Sidle, MD

## 2012-08-18 ENCOUNTER — Other Ambulatory Visit: Payer: Self-pay | Admitting: Family Medicine

## 2012-08-28 ENCOUNTER — Ambulatory Visit (INDEPENDENT_AMBULATORY_CARE_PROVIDER_SITE_OTHER): Payer: Medicare Other | Admitting: Cardiology

## 2012-08-28 ENCOUNTER — Encounter: Payer: Self-pay | Admitting: Cardiology

## 2012-08-28 VITALS — BP 138/50 | HR 49 | Ht 66.0 in | Wt 190.0 lb

## 2012-08-28 DIAGNOSIS — I4892 Unspecified atrial flutter: Secondary | ICD-10-CM

## 2012-08-28 DIAGNOSIS — I2510 Atherosclerotic heart disease of native coronary artery without angina pectoris: Secondary | ICD-10-CM

## 2012-08-28 NOTE — Progress Notes (Signed)
HPI The patient presents for followup of diastolic HF.  Since I last saw him he has been doing okay from a cardiovascular standpoint. He weighs himself daily and is much more compliant with his diet and he watching his volume. He has had problems with his esophagus and had to have this stretched.  He denies any chest pressure, neck or arm discomfort. He has had no palpitations, presyncope or syncope. He denies any PND or orthopnea.  Allergies  Allergen Reactions  . Menthol Hives    Current Outpatient Prescriptions  Medication Sig Dispense Refill  . ALPRAZolam (XANAX) 0.25 MG tablet Take 1 tablet (0.25 mg total) by mouth at bedtime as needed.  90 tablet  1  . apixaban (ELIQUIS) 5 MG TABS tablet Take 1 tablet (5 mg total) by mouth 2 (two) times daily.  180 tablet  3  . Cod Liver Oil 1000 MG CAPS Take 1 capsule by mouth daily.        Marland Kitchen GARLIC PO Take by mouth daily.      . metolazone (ZAROXOLYN) 2.5 MG tablet Take 1 tablet (2.5 mg total) by mouth once. 1 tab once a week  20 tablet  2  . Multiple Vitamins-Minerals (MULTIVITAMINS THER. W/MINERALS) TABS Take 1 tablet by mouth daily.        Marland Kitchen omeprazole (PRILOSEC) 20 MG capsule Take 1 capsule (20 mg total) by mouth 2 (two) times daily.  180 capsule  3  . potassium chloride (K-DUR) 10 MEQ tablet Take 1 tablet (10 mEq total) by mouth 2 (two) times daily.  180 tablet  3  . torsemide (DEMADEX) 20 MG tablet Take 1 tablet (20 mg total) by mouth 2 (two) times daily.  180 tablet  3  . doxycycline (VIBRA-TABS) 100 MG tablet Take 1 tablet (100 mg total) by mouth 2 (two) times daily.  20 tablet  0   No current facility-administered medications for this visit.    Past Medical History  Diagnosis Date  . GERD (gastroesophageal reflux disease)   . Hypertension   . Blind     Right enucleation  . Hearing loss   . Coronary artery disease     Minimal LAD stenosis 2007  . BPH (benign prostatic hypertrophy)   . Hyperlipidemia   . Varicose vein   .  Peripheral neuropathy   . Tremor, essential   . Aortic sclerosis     2008  . Aortic stenosis     Mild, echo, January, 2014  . Atrial flutter     December, 2013  . Chronic anticoagulation     Apixaban started December 27, 2011  . Edema     December, 2013  . CHF (congestive heart failure)     Past Surgical History  Procedure Laterality Date  . Eye surgery    . Lung surgery      Benign lump removed  . Tumor excision      behindleft eye,abpve left eye,left foot  . Esophagogastroduodenoscopy N/A 06/23/2012    Procedure: ESOPHAGOGASTRODUODENOSCOPY (EGD);  Surgeon: Meryl Dare, MD;  Location: [facility];  Service: Endoscopy;  Laterality: N/A;  . Upper gastrointestinal endoscopy     ROS:  As stated in the HPI and negative for all other systems.  PHYSICAL EXAM BP 138/50  Ht 5\' 6"  (1.676 m)  Wt 190 lb (86.183 kg)  BMI 30.68 kg/m2 GENERAL:  Well appearing NECK:  Jugular venous distention to the jaw at 45, waveform within normal limits, carotid upstroke brisk and  symmetric, no bruits, no thyromegaly LYMPHATICS:  No cervical, inguinal adenopathy LUNGS:  Clear to auscultation bilaterally BACK:  No CVA tenderness CHEST:  Unremarkable HEART:  PMI not displaced or sustained,S1 and S2 within normal limits, no S3, no S4,  no clicks, no rubs, harsh systolic murmur radiating out the outflow tract and mid to late peaking, no diastolic murmurs ABD:  Flat, positive bowel sounds normal in frequency in pitch, no bruits, no rebound, no guarding, no midline pulsatile mass, positive  hepatomegaly, no splenomegaly, obese EXT:  2 plus pulses throughout,  Momoderate bilateral edema, no cyanosis no clubbing, venous stasis changes SKIN:  No rashes no nodules, mild bruising  EKG:  Sinus bradycardia, rate 49, first degree AV block, right bundle branch block, no acute ST-T wave changes. 08/28/2012  ASSESSMENT AND PLAN  Atrial flutter - He is in sinus rhythm now. He's tolerating anticoagulation. He  doesn't notice the bradycardia arrhythmia. No change in therapy is indicated. He does have a little bit of a rash that he thinks is related to the Eliquis but this is somewhat atypical as it is only on his cheeks. At this point we discussed about switching medications but he is not interested.  Aortic sclerosis -  He has very mild aortic stenosis. No change in therapy is indicated.  Hypertension -  This is being managed in the context of treating his diastolic heart failure.  Coronary artery disease -  He has no active symptoms. No change in therapy is indicated.  Pulmonary hypertension - The pulmonary hypertension is mild to moderate. No change in therapy is indicated.  Edema - We again discussed the need for conservative management particularly with keeping his feet elevated.

## 2012-08-28 NOTE — Patient Instructions (Addendum)
The current medical regimen is effective;  continue present plan and medications.  Follow up in 6 months with Dr Hochrein.  You will receive a letter in the mail 2 months before you are due.  Please call us when you receive this letter to schedule your follow up appointment.  

## 2012-09-07 ENCOUNTER — Telehealth: Payer: Self-pay

## 2012-09-07 NOTE — Telephone Encounter (Signed)
Patient states that the pharmacy is charging him $209 for a month supply of Eliquis 5mg  he states that previously he was only paying $70 for it. He would like someone to call him back regarding this matter. He said Dr. Milus Glazier was able to help him previously with this matter. He can be reached at [phone]. Thanks

## 2012-09-08 NOTE — Telephone Encounter (Signed)
I don't have any coupons.  He can try the cardiologist or we can switch him to a different blood thinner. KL

## 2012-09-08 NOTE — Telephone Encounter (Signed)
Please advise, he can not use coupon, medicare.

## 2012-09-10 NOTE — Telephone Encounter (Signed)
Once again,since he is medicare a coupon would not be helpful. Called to see if he wants to change this medication. Left message for him to call me back.

## 2012-09-11 ENCOUNTER — Ambulatory Visit (INDEPENDENT_AMBULATORY_CARE_PROVIDER_SITE_OTHER): Payer: Medicare Other | Admitting: Family Medicine

## 2012-09-11 ENCOUNTER — Encounter: Payer: Self-pay | Admitting: Family Medicine

## 2012-09-11 VITALS — BP 133/42 | HR 49 | Temp 97.7°F | Resp 16 | Ht 67.5 in | Wt 190.4 lb

## 2012-09-11 DIAGNOSIS — R609 Edema, unspecified: Secondary | ICD-10-CM

## 2012-09-11 DIAGNOSIS — L02419 Cutaneous abscess of limb, unspecified: Secondary | ICD-10-CM

## 2012-09-11 MED ORDER — SILVER SULFADIAZINE 1 % EX CREA: TOPICAL_CREAM | Freq: Every day | CUTANEOUS | Status: DC

## 2012-09-11 MED ORDER — METOLAZONE 2.5 MG PO TABS: 2.5000 mg | ORAL_TABLET | Freq: Every day | ORAL | Status: DC

## 2012-09-11 MED ORDER — DOXYCYCLINE HYCLATE 100 MG PO TABS: 100.0000 mg | ORAL_TABLET | Freq: Two times a day (BID) | ORAL | Status: DC

## 2012-09-11 MED ORDER — TORSEMIDE 20 MG PO TABS: 20.0000 mg | ORAL_TABLET | Freq: Two times a day (BID) | ORAL | Status: DC

## 2012-09-11 NOTE — Progress Notes (Signed)
79 yo hypertensive man with left leg sore, nonhealing, x 2 months.  Does better after the doxycycline.  Objective:  NAD HEENT:  Unremarkable Chest:  Clear Heart:  Slow, regular with II-III/VI systolic murmur Ext: 3 cm left lateral shin crusty lesion with serous drainage, 3+ pretibial and pedal edema.  Assessment:  A.fib, persistent cellulitis following bullae from edema.  Plan:  Cellulitis and abscess of leg - Plan: doxycycline (VIBRA-TABS) 100 MG tablet, silver sulfADIAZINE (SILVADENE) 1 % cream  Edema Follow up 4 weeks or so. Signed, Elvina Sidle, MD

## 2012-09-15 NOTE — Telephone Encounter (Signed)
Patient has not returned my calls

## 2012-09-25 ENCOUNTER — Encounter: Payer: Self-pay | Admitting: Family Medicine

## 2012-09-25 ENCOUNTER — Ambulatory Visit (INDEPENDENT_AMBULATORY_CARE_PROVIDER_SITE_OTHER): Payer: Medicare Other | Admitting: Family Medicine

## 2012-09-25 VITALS — BP 130/50 | HR 52 | Temp 98.0°F | Resp 16 | Ht 67.5 in | Wt 177.0 lb

## 2012-09-25 DIAGNOSIS — S81002S Unspecified open wound, left knee, sequela: Secondary | ICD-10-CM

## 2012-09-25 DIAGNOSIS — IMO0002 Reserved for concepts with insufficient information to code with codable children: Secondary | ICD-10-CM

## 2012-09-25 DIAGNOSIS — J069 Acute upper respiratory infection, unspecified: Secondary | ICD-10-CM

## 2012-09-25 MED ORDER — AZITHROMYCIN 250 MG PO TABS: ORAL_TABLET | ORAL | Status: DC

## 2012-09-25 NOTE — Progress Notes (Signed)
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 He's been using Silvadene and feels that the ulcer has gotten a lot smaller. He's doing well with respect to the chronic edema, but has not been using compression stockings because they tend to interfere with wound healing whenever he changes the dressing.  Over the last 7 days patient developed a scratchy throat followed by 4 days of loose cough. He's had no chest pain or shortness of breath.  Patient also noticed some decreased hearing in the left ear. He wears hearing aids which he got from the [facility] in general he has been very satisfied with his hearing.  Objective: Examination of lower extremity shows marked postphlebitic changes in both legs. The 1 x 1.5 cm elliptical mid lower extremity ulcer is superficial and does not show any surrounding erythema. Chest: Clear to auscultation Neck: Supple no adenopathy Oropharynx: Clear Ears: Moderate cerumen impaction left side  Patient's ears were irrigated clear  Assessment: Cerumen impaction, URI, slow healing wound on left leg  Followup one month URI, acute - Plan: azithromycin (ZITHROMAX Z-PAK) 250 MG tablet  Open wound of knee, leg (except thigh), and ankle, complicated, left, sequela  Signed, Elvina Sidle, MD

## 2012-10-21 ENCOUNTER — Ambulatory Visit (INDEPENDENT_AMBULATORY_CARE_PROVIDER_SITE_OTHER): Payer: Medicare Other | Admitting: Family Medicine

## 2012-10-21 DIAGNOSIS — IMO0002 Reserved for concepts with insufficient information to code with codable children: Secondary | ICD-10-CM

## 2012-10-21 DIAGNOSIS — H109 Unspecified conjunctivitis: Secondary | ICD-10-CM

## 2012-10-21 DIAGNOSIS — S81802S Unspecified open wound, left lower leg, sequela: Secondary | ICD-10-CM

## 2012-10-21 MED ORDER — TOBRAMYCIN 0.3 % OP SOLN: 1.0000 [drp] | Freq: Four times a day (QID) | OPHTHALMIC | Status: DC

## 2012-10-21 MED ORDER — DOXYCYCLINE HYCLATE 100 MG PO TABS: 100.0000 mg | ORAL_TABLET | Freq: Two times a day (BID) | ORAL | Status: DC

## 2012-10-21 NOTE — Progress Notes (Signed)
Chief Complaint:  Chief Complaint  Patient presents with  . Cellulitis    abcess of (L) leg   . Eye Problem    infection of artificial (L) eye    HPI: 79 y.o. year old male presents with a 7 day history of eye redness involving both eye.   contact use  No sick contacts, recent antibiotics, or recent travels.   No leg trauma, sedentary periods, h/o cancer, or tobacco use.  Past Medical History  Diagnosis Date  . GERD (gastroesophageal reflux disease)   . Hypertension   . Blind     Right enucleation  . Hearing loss   . Coronary artery disease     Minimal LAD stenosis 2007  . BPH (benign prostatic hypertrophy)   . Hyperlipidemia   . Varicose vein   . Peripheral neuropathy   . Tremor, essential   . Aortic sclerosis     2008  . Aortic stenosis     Mild, echo, January, 2014  . Atrial flutter     December, 2013  . Chronic anticoagulation     Apixaban started December 27, 2011  . Edema     December, 2013  . CHF (congestive heart failure)      Home Meds: Prior to Admission medications   Medication Sig Start Date End Date Taking? Authorizing Provider  ALPRAZolam (XANAX) 0.25 MG tablet Take 1 tablet (0.25 mg total) by mouth at bedtime as needed. 07/24/12  Yes Elvina Sidle, MD  apixaban (ELIQUIS) 5 MG TABS tablet Take 1 tablet (5 mg total) by mouth 2 (two) times daily. 07/24/12  Yes Elvina Sidle, MD  [facility] Liver Oil 1000 MG CAPS Take 1 capsule by mouth daily.     Yes Historical Provider, MD  GARLIC PO Take by mouth daily.   Yes Historical Provider, MD  metolazone (ZAROXOLYN) 2.5 MG tablet Take 1 tablet (2.5 mg total) by mouth daily. Every other day 09/11/12  Yes Elvina Sidle, MD  Multiple Vitamins-Minerals (MULTIVITAMINS THER. W/MINERALS) TABS Take 1 tablet by mouth daily.     Yes Historical Provider, MD  omeprazole (PRILOSEC) 20 MG capsule Take 1 capsule (20 mg total) by mouth 2 (two) times daily. 07/24/12  Yes Elvina Sidle, MD  potassium chloride (K-DUR) 10 MEQ tablet  Take 1 tablet (10 mEq total) by mouth 2 (two) times daily. 07/24/12  Yes Elvina Sidle, MD  silver sulfADIAZINE (SILVADENE) 1 % cream Apply topically daily. 09/11/12  Yes Elvina Sidle, MD  torsemide (DEMADEX) 20 MG tablet Take 1 tablet (20 mg total) by mouth 2 (two) times daily. 09/11/12  Yes Elvina Sidle, MD  azithromycin (ZITHROMAX Z-PAK) 250 MG tablet Take as directed on pack 09/25/12   Elvina Sidle, MD    Allergies:  Allergies  Allergen Reactions  . Menthol Hives    History   Social History  . Marital Status: Married    Spouse Name: N/A    Number of Children: 3  . Years of Education: N/A   Occupational History  . [employer]    Social History Main Topics  . Smoking status: Former Smoker    Types: Cigarettes    Quit date: 07/20/1975  . Smokeless tobacco: Never Used  . Alcohol Use: No  . Drug Use: No  . Sexual Activity: Not on file   Other Topics Concern  . Not on file   Social History Narrative   Patient has hostile neighbor with multiple police reports.  Patient is happily married, but son is at  home and is not happy which creates anxious and frustrating relationships.     Review of Systems: Constitutional: negative for chills, fever, night sweats or weight changes Cardiovascular: negative for chest pain or palpitations Respiratory: negative for hemoptysis, wheezing, or shortness of breath Abdominal: negative for abdominal pain, nausea, vomiting or diarrhea Dermatological: negative for rash Neurologic: negative for headache   Physical Exam: There were no vitals taken for this visit., There is no weight on file to calculate BMI. General: Well developed, well nourished, in no acute distress. Head: Normocephalic, atraumatic, nares are congested. Bilateral auditory canals clear, TM's are without perforation, pearly grey with reflective cone of light bilaterally. No sinus TTP. Oral cavity moist, dentition normal. Posterior pharynx with post nasal drip and  mild erythema. No peritonsillar abscess or tonsillar exudate. Eyes:  Injection both eye, mild discharge at lid margins  (left eye is artificial) Neck: Supple. No thyromegaly. Full ROM. No lymphadenopathy. Lungs: Coarse breath sounds bilaterally without wheezes, rales, or rhonchi. Breathing is unlabored.  Heart: RRR with S1 S2. No murmurs, rubs, or gallops appreciated. Msk:  Strength and tone normal for age. Extremities: No clubbing or cyanosis. Leathery lower extremities with persistent lower left leg 2 cm ulcer and surrounding erythema. Neuro: Alert and oriented X 3. Moves all extremities spontaneously. CNII-XII grossly in tact. Psych:  Responds to questions appropriately with a normal affect.   Labs:   ASSESSMENT AND PLAN:  79 y.o. year old male with conjunctivitis involving both eyes. Wound of left leg, sequela - Plan: AMB referral to wound care center, doxycycline (VIBRA-TABS) 100 MG tablet  Conjunctivitis - Plan: tobramycin (TOBREX) 0.3 % ophthalmic solution   - Pt ed given including hygiene measures and avoiding use of contact lenses -RTC precautions -RTC 3-5 days if no improvement  Signed, Elvina Sidle, MD 10/21/2012 5:00 PM

## 2012-10-25 ENCOUNTER — Other Ambulatory Visit: Payer: Self-pay | Admitting: Family Medicine

## 2012-11-03 ENCOUNTER — Encounter (HOSPITAL_BASED_OUTPATIENT_CLINIC_OR_DEPARTMENT_OTHER): Payer: Medicare Other | Attending: Plastic Surgery

## 2012-11-03 DIAGNOSIS — I872 Venous insufficiency (chronic) (peripheral): Secondary | ICD-10-CM | POA: Insufficient documentation

## 2012-11-04 NOTE — Progress Notes (Signed)
[facility]  NAME:  Keith Ellis, Keith Ellis                ACCOUNT NO.:  [id number]  MEDICAL RECORD NO.:  [id number]      DATE OF BIRTH:  [birth year]  PHYSICIAN:  Wayland Denis, DO       VISIT DATE:  11/03/2012                                  OFFICE VISIT   HISTORY:  The patient is an 79 year old male who is here for evaluation of his left leg, chronic venous insufficiency ulcer.  He states that he gets these frequently, it starts with a blister, pops, and then he has some trouble healing.  This one has remained for several weeks which has concerned him.  He has just been using dry dressings for right now.  PAST MEDICAL HISTORY:  Positive for gastroesophageal reflux disease, chronic congestive heart failure, gout, hypertension, vision and hearing loss, coronary artery disease, BPD, hyperlipidemia, peripheral neuropathy, aortic stenosis, and he is on chronic anticoagulation.  PAST SURGICAL HISTORY:  He had tumor removed from his eye and his lung in 1968 and 1969.  MEDICATIONS:  Xanax, Eliquis, cod liver oil, garlic, Prilosec, K-Dur, silver sulfadiazine, Demadex, and Zaroxolyn.  ALLERGIES:  MENTHOL.  REVIEW OF SYSTEMS:  As stated above, otherwise negative.  PHYSICAL EXAMINATION:  GENERAL:  He is alert, oriented, cooperative, not in any acute distress.  He is pleasant and cooperative. NECK:  He does not have any cervical lymphadenopathy. LUNGS:  His breathing is unlabored. CARDIOVASCULAR:  His heart rate is regular. EXTREMITIES:  His wound is on the lateral aspect of his left leg.  He has got severe varicose veins on that leg with hemosiderosis.  Pulse is present, but weak.  Recommend increasing protein, multivitamin, vitamin C, zinc elevation, silver alginate wrap, and check a pre-albumin and vascular studies, and he is in agreement for that.  We will see him back in a week.     Wayland Denis, DO    CS/MEDQ  D:  11/03/2012  T:  11/04/2012  Job:  [id number]

## 2012-11-05 ENCOUNTER — Telehealth (HOSPITAL_COMMUNITY): Payer: Self-pay | Admitting: *Deleted

## 2012-11-05 ENCOUNTER — Other Ambulatory Visit (HOSPITAL_COMMUNITY): Payer: Self-pay | Admitting: Plastic Surgery

## 2012-11-05 DIAGNOSIS — S81009D Unspecified open wound, unspecified knee, subsequent encounter: Secondary | ICD-10-CM

## 2012-11-07 ENCOUNTER — Encounter (HOSPITAL_COMMUNITY): Payer: Medicare Other

## 2012-11-10 ENCOUNTER — Ambulatory Visit (HOSPITAL_COMMUNITY)
Admission: RE | Admit: 2012-11-10 | Discharge: 2012-11-10 | Disposition: A | Discharge status: Home / Self Care | Payer: Medicare Other | Source: Ambulatory Visit | Attending: Cardiovascular Disease | Admitting: Cardiovascular Disease

## 2012-11-10 ENCOUNTER — Encounter (HOSPITAL_BASED_OUTPATIENT_CLINIC_OR_DEPARTMENT_OTHER): Payer: Medicare Other | Attending: Plastic Surgery

## 2012-11-10 DIAGNOSIS — I872 Venous insufficiency (chronic) (peripheral): Secondary | ICD-10-CM | POA: Insufficient documentation

## 2012-11-10 DIAGNOSIS — Z48812 Encounter for surgical aftercare following surgery on the circulatory system: Secondary | ICD-10-CM

## 2012-11-10 DIAGNOSIS — L97809 Non-pressure chronic ulcer of other part of unspecified lower leg with unspecified severity: Secondary | ICD-10-CM | POA: Insufficient documentation

## 2012-11-10 DIAGNOSIS — S81009D Unspecified open wound, unspecified knee, subsequent encounter: Secondary | ICD-10-CM

## 2012-11-10 DIAGNOSIS — M7989 Other specified soft tissue disorders: Secondary | ICD-10-CM

## 2012-11-10 DIAGNOSIS — S81009A Unspecified open wound, unspecified knee, initial encounter: Secondary | ICD-10-CM | POA: Insufficient documentation

## 2012-11-10 DIAGNOSIS — X58XXXA Exposure to other specified factors, initial encounter: Secondary | ICD-10-CM | POA: Insufficient documentation

## 2012-11-10 NOTE — Progress Notes (Signed)
Venous Duplex Lower Ext. Completed. Negative for DVT, Positive for Venous Insuffiencey. Marilynne Halsted, BS, RDMS, RVT

## 2012-11-19 NOTE — Progress Notes (Signed)
[facility]  NAME:  Keith Ellis, Keith Ellis                     ACCOUNT NO.:  MEDICAL RECORD NO.:  [id number]      DATE OF BIRTH:  [birth year]  PHYSICIAN:  Wayland Denis, DO       VISIT DATE:  11/17/2012                                  OFFICE VISIT   The patient is an 79 year old male, who is here for followup on his left lower extremity chronic venous insufficiency ulcer.  Overall, he is doing much better and is pleased with his progress.  He has not gone to compression hose, he has not gotten them, but he is wearing a sock on the right leg.  SOCIAL HISTORY:  Unchanged.  MEDICATIONS:  Unchanged.  PHYSICAL EXAMINATION:  GENERAL:  He is alert, oriented, cooperative, not in any acute distress.  He is pleasant. HEENT:  Pupils are equal.  Extraocular muscles are intact.  No cervical lymphadenopathy. LUNGS:  His breathing is unlabored. HEART:  His heart rate is regular. SKIN:  The wound has improved and the swelling is better as well.  We will continue with the Silvercel compression wrap and see him back in 1 week.  In the meantime, he needs to work on his protein intake as his prealbumin was only in the 15 range, this is not going to be helpful for healing his wound and we did discuss that.  He acknowledges understanding and agreeing with the plan and making attempts towards improving his protein level.     Wayland Denis, DO     CS/MEDQ  D:  11/17/2012  T:  11/18/2012  Job:  [id number]

## 2012-12-16 ENCOUNTER — Telehealth: Payer: Self-pay | Admitting: *Deleted

## 2012-12-16 NOTE — Telephone Encounter (Signed)
Patient had been scheduled/ confirmed w/ Dr Anne Hahn 12/17/12

## 2012-12-17 ENCOUNTER — Encounter: Payer: Self-pay | Admitting: Neurology

## 2012-12-17 ENCOUNTER — Ambulatory Visit (INDEPENDENT_AMBULATORY_CARE_PROVIDER_SITE_OTHER): Payer: Medicare Other | Admitting: Neurology

## 2012-12-17 VITALS — BP 153/55 | HR 51 | Wt 195.0 lb

## 2012-12-17 DIAGNOSIS — G609 Hereditary and idiopathic neuropathy, unspecified: Secondary | ICD-10-CM

## 2012-12-17 DIAGNOSIS — R609 Edema, unspecified: Secondary | ICD-10-CM

## 2012-12-17 DIAGNOSIS — G629 Polyneuropathy, unspecified: Secondary | ICD-10-CM

## 2012-12-17 MED ORDER — DULOXETINE HCL 60 MG PO CPEP: 60.0000 mg | ORAL_CAPSULE | Freq: Every day | ORAL | Status: DC

## 2012-12-17 NOTE — Progress Notes (Signed)
Reason for visit: Peripheral neuropathy  Keith Ellis is an 79 y.o. male  History of present illness:  Keith Ellis is an 79 year old right-handed white male with a history of a peripheral neuropathy. The patient is also noted to have hyperreflexia, and MRI evaluation of the cervical spine did not show an etiology for this. The patient indicates that he has had several years of discomfort in the feet associated with numbness mainly on the bottom of the feet. The patient over the last 3 days has had significant worsening of pain involving the left foot. The patient indicates that the pain is worse when he is up walking, better when he is off of his feet, but the pain persists even while lying down. The patient indicates that the pain is most severe in the arch of the foot on the left, not as bad on the right foot. In the past, the patient has not wanted medications for the pain, but at this point the pain is significant enough that he would consider a medication. The patient does have some gait instability, but he denies any falls. The patient has some urgency of the bladder associated with benign prostate enlargement. The patient returns to this office for an evaluation. The patient does report some back pain, without radiation down the legs.  Past Medical History  Diagnosis Date  . GERD (gastroesophageal reflux disease)   . Hypertension   . Blind     Right enucleation  . Hearing loss   . Coronary artery disease     Minimal LAD stenosis 2007  . BPH (benign prostatic hypertrophy)   . Hyperlipidemia   . Varicose vein   . Peripheral neuropathy   . Tremor, essential   . Aortic sclerosis     2008  . Aortic stenosis     Mild, echo, January, 2014  . Atrial flutter     December, 2013  . Chronic anticoagulation     Apixaban started December 27, 2011  . Edema     December, 2013  . CHF (congestive heart failure)   . Hearing difficulty of both ears     hearing aids    Past Surgical  History  Procedure Laterality Date  . Eye surgery    . Lung surgery      Benign lump removed  . Tumor excision      behindleft eye,abpve left eye,left foot  . Esophagogastroduodenoscopy N/A 06/23/2012    Procedure: ESOPHAGOGASTRODUODENOSCOPY (EGD);  Surgeon: Meryl Dare, MD;  Location: [facility];  Service: Endoscopy;  Laterality: N/A;  . Upper gastrointestinal endoscopy    . Cataract extraction Bilateral     Family History  Problem Relation Age of Onset  . Cancer Father     Rectal  . Rectal cancer Father   . Cancer Mother     Breast  . Stomach cancer Neg Hx   . Esophageal cancer Neg Hx   . Colon cancer Neg Hx   . Cancer Sister     Social history:  reports that he quit smoking about 37 years ago. His smoking use included Cigarettes. He smoked 0.00 packs per day. He has never used smokeless tobacco. He reports that he does not drink alcohol or use illicit drugs.    Allergies  Allergen Reactions  . Menthol Hives    Medications:  Current Outpatient Prescriptions on File Prior to Visit  Medication Sig Dispense Refill  . ALPRAZolam (XANAX) 0.25 MG tablet Take 1 tablet (0.25 mg  total) by mouth at bedtime as needed.  90 tablet  1  . apixaban (ELIQUIS) 5 MG TABS tablet Take 1 tablet (5 mg total) by mouth 2 (two) times daily.  180 tablet  3  . Cod Liver Oil 1000 MG CAPS Take 1 capsule by mouth daily.        Marland Kitchen GARLIC PO Take by mouth daily.      . metolazone (ZAROXOLYN) 2.5 MG tablet Take 1 tablet (2.5 mg total) by mouth daily. Every other day  20 tablet  6  . Multiple Vitamins-Minerals (MULTIVITAMINS THER. W/MINERALS) TABS Take 1 tablet by mouth daily.        Marland Kitchen omeprazole (PRILOSEC) 20 MG capsule Take 1 capsule (20 mg total) by mouth 2 (two) times daily.  180 capsule  3  . torsemide (DEMADEX) 20 MG tablet Take 1 tablet (20 mg total) by mouth 2 (two) times daily.  180 tablet  3   No current facility-administered medications on file prior to visit.    ROS:  Out of a  complete 14 system review of symptoms, the patient complains only of the following symptoms, and all other reviewed systems are negative.  Ringing in the ears, problems swallowing Leg swelling, heart murmur Eye discharged Constipation Sleep talking, acting out dreams Difficulty urinating, decreased urine Walking difficulty Bruising, easy bleeding Dizziness  Blood pressure 153/55, pulse 51, weight 195 lb (88.451 kg).  Physical Exam  General: The patient is alert and cooperative at the time of the examination.  Skin: 2-3+ edema below the knees is noted bilaterally. Varicose veins are present.   Neurologic Exam  Mental status: The patient is oriented x 3.  Cranial nerves: Facial symmetry is present. Speech is normal, no aphasia or dysarthria is noted. Extraocular movements are full. Visual fields are full.  Motor: The patient has good strength in all 4 extremities.  Sensory examination: There is a stocking pattern pinprick sensory deficit up the legs to the knees. Vibration sensation is decreased slightly on the left foot as compared to right. Position sense is intact bilaterally.  Coordination: The patient has good finger-nose-finger and heel-to-shin bilaterally.  Gait and station: The patient has a wide-based gait, limping gait on the left leg. Tandem gait slightly unsteady. Romberg is negative. No drift is seen.  Reflexes: Deep tendon reflexes are symmetric, somewhat brisk at the knees, ankle jerk reflexes are present bilaterally.   Assessment/Plan:  One. Peripheral neuropathy  2. Mild gait disturbance  The patient has pain that is increased with walking, and he could have a mechanical source pain for his left foot. The patient will be placed on Cymbalta, samples were given of the 30 mg and 60 mg tablets. If the patient does well with this, he will get a prescription filled for the 60 mg tablets. The patient will followup in 6 months otherwise. If the pain persists, the  patient will contact our office. I will consider an x-ray of the foot and a possible referral to a podiatrist.  Keith Palau MD 12/17/2012 7:54 PM  [facility] [address] [place], [place] [address]  Phone [phone] Fax [phone]

## 2012-12-19 ENCOUNTER — Other Ambulatory Visit: Payer: Self-pay | Admitting: Family Medicine

## 2013-01-08 ENCOUNTER — Encounter (HOSPITAL_COMMUNITY): Payer: Self-pay | Admitting: Emergency Medicine

## 2013-01-08 ENCOUNTER — Inpatient Hospital Stay (HOSPITAL_COMMUNITY): Payer: Medicare HMO

## 2013-01-08 ENCOUNTER — Inpatient Hospital Stay (HOSPITAL_COMMUNITY)
Admission: EM | Admit: 2013-01-08 | Discharge: 2013-01-09 | DRG: 683 | Disposition: A | Discharge status: Home / Self Care | Payer: Medicare HMO | Attending: Internal Medicine | Admitting: Internal Medicine

## 2013-01-08 DIAGNOSIS — N179 Acute kidney failure, unspecified: Principal | ICD-10-CM | POA: Insufficient documentation

## 2013-01-08 DIAGNOSIS — I2510 Atherosclerotic heart disease of native coronary artery without angina pectoris: Secondary | ICD-10-CM

## 2013-01-08 DIAGNOSIS — N400 Enlarged prostate without lower urinary tract symptoms: Secondary | ICD-10-CM

## 2013-01-08 DIAGNOSIS — R339 Retention of urine, unspecified: Secondary | ICD-10-CM | POA: Diagnosis present

## 2013-01-08 DIAGNOSIS — N401 Enlarged prostate with lower urinary tract symptoms: Secondary | ICD-10-CM | POA: Diagnosis present

## 2013-01-08 DIAGNOSIS — Z803 Family history of malignant neoplasm of breast: Secondary | ICD-10-CM

## 2013-01-08 DIAGNOSIS — G629 Polyneuropathy, unspecified: Secondary | ICD-10-CM | POA: Diagnosis present

## 2013-01-08 DIAGNOSIS — Z800 Family history of malignant neoplasm of digestive organs: Secondary | ICD-10-CM

## 2013-01-08 DIAGNOSIS — Z9849 Cataract extraction status, unspecified eye: Secondary | ICD-10-CM

## 2013-01-08 DIAGNOSIS — G609 Hereditary and idiopathic neuropathy, unspecified: Secondary | ICD-10-CM | POA: Diagnosis present

## 2013-01-08 DIAGNOSIS — I4892 Unspecified atrial flutter: Secondary | ICD-10-CM | POA: Diagnosis present

## 2013-01-08 DIAGNOSIS — H5440 Blindness, one eye, unspecified eye: Secondary | ICD-10-CM | POA: Diagnosis present

## 2013-01-08 DIAGNOSIS — I5032 Chronic diastolic (congestive) heart failure: Secondary | ICD-10-CM | POA: Diagnosis present

## 2013-01-08 DIAGNOSIS — Z87891 Personal history of nicotine dependence: Secondary | ICD-10-CM

## 2013-01-08 DIAGNOSIS — I350 Nonrheumatic aortic (valve) stenosis: Secondary | ICD-10-CM | POA: Diagnosis present

## 2013-01-08 DIAGNOSIS — Z79899 Other long term (current) drug therapy: Secondary | ICD-10-CM

## 2013-01-08 DIAGNOSIS — I2789 Other specified pulmonary heart diseases: Secondary | ICD-10-CM | POA: Diagnosis present

## 2013-01-08 DIAGNOSIS — I509 Heart failure, unspecified: Secondary | ICD-10-CM | POA: Diagnosis present

## 2013-01-08 DIAGNOSIS — H9190 Unspecified hearing loss, unspecified ear: Secondary | ICD-10-CM | POA: Diagnosis present

## 2013-01-08 DIAGNOSIS — E785 Hyperlipidemia, unspecified: Secondary | ICD-10-CM | POA: Diagnosis present

## 2013-01-08 DIAGNOSIS — N138 Other obstructive and reflux uropathy: Secondary | ICD-10-CM | POA: Diagnosis present

## 2013-01-08 DIAGNOSIS — K219 Gastro-esophageal reflux disease without esophagitis: Secondary | ICD-10-CM | POA: Diagnosis present

## 2013-01-08 DIAGNOSIS — I10 Essential (primary) hypertension: Secondary | ICD-10-CM | POA: Diagnosis present

## 2013-01-08 DIAGNOSIS — Z7901 Long term (current) use of anticoagulants: Secondary | ICD-10-CM

## 2013-01-08 DIAGNOSIS — I359 Nonrheumatic aortic valve disorder, unspecified: Secondary | ICD-10-CM | POA: Diagnosis present

## 2013-01-08 HISTORY — DX: Retention of urine, unspecified: R33.9

## 2013-01-08 LAB — POCT I-STAT, CHEM 8
BUN: 38 mg/dL — AB (ref 6–23)
Calcium, Ion: 1.15 mmol/L (ref 1.13–1.30)
Chloride: 98 mEq/L (ref 96–112)
Creatinine, Ser: 3 mg/dL — ABNORMAL HIGH (ref 0.50–1.35)
Glucose, Bld: 128 mg/dL — ABNORMAL HIGH (ref 70–99)
HEMATOCRIT: 46 % (ref 39.0–52.0)
Hemoglobin: 15.6 g/dL (ref 13.0–17.0)
POTASSIUM: 3.9 meq/L (ref 3.7–5.3)
Sodium: 141 mEq/L (ref 137–147)
TCO2: 28 mmol/L (ref 0–100)

## 2013-01-08 LAB — CBC WITH DIFFERENTIAL/PLATELET
BASOS PCT: 0 % (ref 0–1)
Basophils Absolute: 0 10*3/uL (ref 0.0–0.1)
EOS ABS: 0 10*3/uL (ref 0.0–0.7)
Eosinophils Relative: 0 % (ref 0–5)
HEMATOCRIT: 44.6 % (ref 39.0–52.0)
Hemoglobin: 15.3 g/dL (ref 13.0–17.0)
LYMPHS ABS: 1 10*3/uL (ref 0.7–4.0)
Lymphocytes Relative: 10 % — ABNORMAL LOW (ref 12–46)
MCH: 29.9 pg (ref 26.0–34.0)
MCHC: 34.3 g/dL (ref 30.0–36.0)
MCV: 87.3 fL (ref 78.0–100.0)
Monocytes Absolute: 0.9 10*3/uL (ref 0.1–1.0)
Monocytes Relative: 9 % (ref 3–12)
NEUTROS ABS: 8.4 10*3/uL — AB (ref 1.7–7.7)
NEUTROS PCT: 81 % — AB (ref 43–77)
Platelets: 128 10*3/uL — ABNORMAL LOW (ref 150–400)
RBC: 5.11 MIL/uL (ref 4.22–5.81)
RDW: 13.5 % (ref 11.5–15.5)
WBC: 10.4 10*3/uL (ref 4.0–10.5)

## 2013-01-08 LAB — URINALYSIS, ROUTINE W REFLEX MICROSCOPIC
Bilirubin Urine: NEGATIVE
Glucose, UA: NEGATIVE mg/dL
KETONES UR: NEGATIVE mg/dL
LEUKOCYTES UA: NEGATIVE
NITRITE: NEGATIVE
PROTEIN: NEGATIVE mg/dL
Specific Gravity, Urine: 1.013 (ref 1.005–1.030)
UROBILINOGEN UA: 0.2 mg/dL (ref 0.0–1.0)
pH: 5.5 (ref 5.0–8.0)

## 2013-01-08 LAB — COMPREHENSIVE METABOLIC PANEL
ALT: 8 U/L (ref 0–53)
AST: 20 U/L (ref 0–37)
Albumin: 3.6 g/dL (ref 3.5–5.2)
Alkaline Phosphatase: 93 U/L (ref 39–117)
BUN: 39 mg/dL — ABNORMAL HIGH (ref 6–23)
CO2: 30 mEq/L (ref 19–32)
CREATININE: 3.03 mg/dL — AB (ref 0.50–1.35)
Calcium: 9.3 mg/dL (ref 8.4–10.5)
Chloride: 98 mEq/L (ref 96–112)
GFR calc Af Amer: 21 mL/min — ABNORMAL LOW (ref >90)
GFR, EST NON AFRICAN AMERICAN: 18 mL/min — AB (ref >90)
Glucose, Bld: 131 mg/dL — ABNORMAL HIGH (ref 70–99)
Potassium: 4.8 mEq/L (ref 3.7–5.3)
SODIUM: 141 meq/L (ref 137–147)
TOTAL PROTEIN: 7.1 g/dL (ref 6.0–8.3)
Total Bilirubin: 3.1 mg/dL — ABNORMAL HIGH (ref 0.3–1.2)

## 2013-01-08 LAB — PROTIME-INR
INR: 1.45 (ref 0.00–1.49)
Prothrombin Time: 17.3 seconds — ABNORMAL HIGH (ref 11.6–15.2)

## 2013-01-08 LAB — MAGNESIUM: Magnesium: 1.9 mg/dL (ref 1.5–2.5)

## 2013-01-08 LAB — LIPASE, BLOOD: Lipase: 13 U/L (ref 11–59)

## 2013-01-08 LAB — APTT: aPTT: 38 seconds — ABNORMAL HIGH (ref 24–37)

## 2013-01-08 LAB — SODIUM, URINE, RANDOM: Sodium, Ur: 36 mEq/L

## 2013-01-08 LAB — CREATININE, URINE, RANDOM: Creatinine, Urine: 106.21 mg/dL

## 2013-01-08 LAB — URINE MICROSCOPIC-ADD ON

## 2013-01-08 MED ORDER — SODIUM CHLORIDE 0.9 % IJ SOLN: 3.0000 mL | Freq: Two times a day (BID) | INTRAMUSCULAR | Status: DC

## 2013-01-08 MED ORDER — APIXABAN 2.5 MG PO TABS
2.5000 mg | ORAL_TABLET | Freq: Two times a day (BID) | ORAL | Status: DC
Start: 2013-01-08 — End: 2013-01-09
  Administered 2013-01-08 – 2013-01-09 (×2): 2.5 mg via ORAL
  Filled 2013-01-08 (×3): qty 1

## 2013-01-08 MED ORDER — PANTOPRAZOLE SODIUM 40 MG PO TBEC
40.0000 mg | DELAYED_RELEASE_TABLET | Freq: Every day | ORAL | Status: DC
  Administered 2013-01-08 – 2013-01-09 (×2): 40 mg via ORAL
  Filled 2013-01-08 (×3): qty 1

## 2013-01-08 MED ORDER — FINASTERIDE 5 MG PO TABS
5.0000 mg | ORAL_TABLET | Freq: Every day | ORAL | Status: DC
  Administered 2013-01-08 – 2013-01-09 (×2): 5 mg via ORAL
  Filled 2013-01-08 (×2): qty 1

## 2013-01-08 MED ORDER — SODIUM CHLORIDE 0.9 % IJ SOLN: 3.0000 mL | INTRAMUSCULAR | Status: DC | PRN

## 2013-01-08 MED ORDER — SODIUM CHLORIDE 0.9 % IJ SOLN
3.0000 mL | Freq: Two times a day (BID) | INTRAMUSCULAR | Status: DC
  Administered 2013-01-08 – 2013-01-09 (×2): 3 mL via INTRAVENOUS

## 2013-01-08 MED ORDER — ALPRAZOLAM 0.25 MG PO TABS: 0.2500 mg | ORAL_TABLET | Freq: Every evening | ORAL | Status: DC | PRN

## 2013-01-08 MED ORDER — ACETAMINOPHEN 325 MG PO TABS: 650.0000 mg | ORAL_TABLET | Freq: Four times a day (QID) | ORAL | Status: DC | PRN

## 2013-01-08 MED ORDER — DULOXETINE HCL 60 MG PO CPEP
60.0000 mg | ORAL_CAPSULE | Freq: Every day | ORAL | Status: DC
  Administered 2013-01-08 – 2013-01-09 (×2): 60 mg via ORAL
  Filled 2013-01-08 (×2): qty 1

## 2013-01-08 MED ORDER — SODIUM CHLORIDE 0.9 % IV SOLN: 250.0000 mL | INTRAVENOUS | Status: DC | PRN

## 2013-01-08 MED ORDER — ACETAMINOPHEN 650 MG RE SUPP: 650.0000 mg | Freq: Four times a day (QID) | RECTAL | Status: DC | PRN

## 2013-01-08 NOTE — ED Notes (Signed)
Pt c/o general abdominal pain that radiates to low back pain. Pt reports difficulty with urination. Pt reports nausea, denies vomiting. Pt presents with abdominal distention.

## 2013-01-08 NOTE — Progress Notes (Addendum)
Pt O4x no complaints of pain or Sob. Pt has foley acute retention. 1200c empty upon arrival. CHF education  Booklet given .will continue to monitor

## 2013-01-08 NOTE — ED Notes (Signed)
Total output since catheter insertion is 1054ml; patient reports feeling much better, pain in abdomen almost gone 1/10 at this time.

## 2013-01-08 NOTE — ED Provider Notes (Signed)
CSN: [id number]     Arrival date & time 01/08/13  1027 History   First MD Initiated Contact with Patient 01/08/13 1101     Chief Complaint  Patient presents with  . Abdominal Pain  . Back Pain   (Consider location/radiation/quality/duration/timing/severity/associated sxs/prior Treatment) Patient is a 80 y.o. male presenting with abdominal pain and back pain.  Abdominal Pain Back Pain Associated symptoms: abdominal pain    Pt reports mild diffuse lower back pain for several days. Since yesterday he has had lower abdominal pain and difficulty urinating. States he feels like he needs to go but nothing will come out. Denies fever, vomiting diarrhea. No weakness or dizziness. Has history of BPH but never had urinary retention.   Past Medical History  Diagnosis Date  . GERD (gastroesophageal reflux disease)   . Hypertension   . Blind     Right enucleation  . Hearing loss   . Coronary artery disease     Minimal LAD stenosis 2007  . BPH (benign prostatic hypertrophy)   . Hyperlipidemia   . Varicose vein   . Peripheral neuropathy   . Tremor, essential   . Aortic sclerosis     2008  . Aortic stenosis     Mild, echo, January, 2014  . Atrial flutter     December, 2013  . Chronic anticoagulation     Apixaban started December 27, 2011  . Edema     December, 2013  . CHF (congestive heart failure)   . Hearing difficulty of both ears     hearing aids   Past Surgical History  Procedure Laterality Date  . Eye surgery    . Lung surgery      Benign lump removed  . Tumor excision      behindleft eye,abpve left eye,left foot  . Esophagogastroduodenoscopy N/A 06/23/2012    Procedure: ESOPHAGOGASTRODUODENOSCOPY (EGD);  Surgeon: Ladene Artist, MD;  Location: [facility];  Service: Endoscopy;  Laterality: N/A;  . Upper gastrointestinal endoscopy    . Cataract extraction Bilateral    Family History  Problem Relation Age of Onset  . Cancer Father     Rectal  . Rectal cancer Father    . Cancer Mother     Breast  . Stomach cancer Neg Hx   . Esophageal cancer Neg Hx   . Colon cancer Neg Hx   . Cancer Sister    History  Substance Use Topics  . Smoking status: Former Smoker    Types: Cigarettes    Quit date: 07/20/1975  . Smokeless tobacco: Never Used  . Alcohol Use: No    Review of Systems  Gastrointestinal: Positive for abdominal pain.  Musculoskeletal: Positive for back pain.   All other systems reviewed and are negative except as noted in HPI.   Allergies  Menthol  Home Medications   Current Outpatient Rx  Name  Route  Sig  Dispense  Refill  . ALPRAZolam (XANAX) 0.25 MG tablet   Oral   Take 1 tablet (0.25 mg total) by mouth at bedtime as needed.   90 tablet   1   . apixaban (ELIQUIS) 5 MG TABS tablet   Oral   Take 1 tablet (5 mg total) by mouth 2 (two) times daily.   180 tablet   3   . Cod Liver Oil 1000 MG CAPS   Oral   Take 1 capsule by mouth daily.           . DULoxetine (CYMBALTA) 60 MG  capsule   Oral   Take 1 capsule (60 mg total) by mouth daily.   30 capsule   3   . GARLIC PO   Oral   Take by mouth daily.         Marland Kitchen L-Tryptophan 500 MG CAPS   Oral   Take 500 mg by mouth at bedtime.         . metolazone (ZAROXOLYN) 2.5 MG tablet   Oral   Take 1 tablet (2.5 mg total) by mouth daily. Every other day   20 tablet   6   . Multiple Vitamins-Minerals (MULTIVITAMINS THER. W/MINERALS) TABS   Oral   Take 1 tablet by mouth daily.           Marland Kitchen omeprazole (PRILOSEC) 20 MG capsule   Oral   Take 1 capsule (20 mg total) by mouth 2 (two) times daily.   180 capsule   3   . Potassium Chloride ER 20 MEQ TBCR   Oral   Take 20 mEq by mouth 2 (two) times daily.         Marland Kitchen torsemide (DEMADEX) 20 MG tablet   Oral   Take 1 tablet (20 mg total) by mouth 2 (two) times daily.   180 tablet   3   . zinc gluconate 50 MG tablet   Oral   Take 50 mg by mouth daily.          BP 146/43  Pulse 52  Temp(Src) 97.6 F (36.4 C)  (Oral)  Resp 18  Ht 5\' 5"  (1.651 m)  Wt 180 lb (81.647 kg)  BMI 29.95 kg/m2  SpO2 96% Physical Exam  Nursing note and vitals reviewed. Constitutional: He is oriented to person, place, and time. He appears well-developed and well-nourished.  HENT:  Head: Normocephalic and atraumatic.  Eyes: EOM are normal. Pupils are equal, round, and reactive to light.  Neck: Normal range of motion. Neck supple.  Cardiovascular: Normal rate, normal heart sounds and intact distal pulses.   Pulmonary/Chest: Effort normal and breath sounds normal.  Abdominal: Bowel sounds are normal. He exhibits mass (distrended bladder, no pulsatile masses). He exhibits no distension. There is tenderness. There is no rebound and no guarding.  Musculoskeletal: Normal range of motion. He exhibits no edema and no tenderness.  Neurological: He is alert and oriented to person, place, and time. He has normal strength. No cranial nerve deficit or sensory deficit.  Skin: Skin is warm and dry. No rash noted.  Psychiatric: He has a normal mood and affect.    ED Course  Procedures (including critical care time) Labs Review Labs Reviewed  URINALYSIS, ROUTINE W REFLEX MICROSCOPIC - Abnormal; Notable for the following:    Hgb urine dipstick TRACE (*)    All other components within normal limits  COMPREHENSIVE METABOLIC PANEL - Abnormal; Notable for the following:    Glucose, Bld 131 (*)    BUN 39 (*)    Creatinine, Ser 3.03 (*)    Total Bilirubin 3.1 (*)    GFR calc non Af Amer 18 (*)    GFR calc Af Amer 21 (*)    All other components within normal limits  CBC WITH DIFFERENTIAL - Abnormal; Notable for the following:    Platelets 128 (*)    Neutrophils Relative % 81 (*)    Neutro Abs 8.4 (*)    Lymphocytes Relative 10 (*)    All other components within normal limits  POCT I-STAT, CHEM 8 - Abnormal; Notable  for the following:    BUN 38 (*)    Creatinine, Ser 3.00 (*)    Glucose, Bld 128 (*)    All other components within  normal limits  LIPASE, BLOOD  URINE MICROSCOPIC-ADD ON   Imaging Review No results found.  EKG Interpretation    Date/Time:  Thursday January 08 2013 10:39:42 EST Ventricular Rate:  50 PR Interval:    QRS Duration: 174 QT Interval:  488 QTC Calculation: 444 R Axis:   87 Text Interpretation:  Atrial flutter Right bundle branch block Abnormal ECG Since last tracing Atrial flutter NOW PRESENT Confirmed by Dorann Davidson  MD, Deadrian Toya ([id number]) on 01/08/2013 10:46:07 AM            MDM   1. Urinary retention   2. Acute renal failure    Large volume urinary retention with evidence of renal failure. Will admit for further evaluation.     Flannery Cavallero B. Karle Starch, MD 01/08/13 1248

## 2013-01-08 NOTE — Progress Notes (Signed)
Pt had bleeding around foley, NT clean. Bleeding appears to be stopped @ this time. MD paged. Will continue to monitor

## 2013-01-08 NOTE — H&P (Signed)
Date: 01/08/2013               Patient Name:  Keith Ellis MRN: [id number]  DOB: 05/03/1929 Age / Sex: 80 y.o., male   PCP: [name], MD         Medical Service: Internal Medicine Teaching Service         Attending Physician: Dr. Burman Freestone, MD    First Contact: Dr. Duwaine Maxin Pager: [phone]  Second Contact: Dr. Charlann Lange Pager: [phone]       After Hours (After 5p/  First Contact Pager: [phone]  weekends / holidays): Second Contact Pager: [phone]   Chief Complaint: urinary retention  History of Present Illness: Keith Ellis is an 80 year old male with a PMH of HTN, HLD, CAD, atrial flutter (on Eliquis), diastolic HF (EF 123456 Jan. 2014), aortic sclerosis, pulmonary hypertension, BPH, GERD, peripheral neuropathy and left eye blindness.  He presents with urinary retention that started two nights ago.  He reports getting out of bed about 20 times that night to try and empty his bladder however he was unable to get any urine out.  This continued yesterday and he began to experience abdominal and back pain.  He denies hematuria, saddle anesthesia or gait dysfunction.  He reports difficulty with urination in the past secondary to BPH but he has never experienced inability to void before.  He denies ever requiring catheter insertion for retention and he says he has never had to see a Urologist.    In the ED:  T 97.32F, RR20, SpO2 100%, HR55, BP180/51; 1L output and resolving abdominal pain after Foley insertion.  Noted to have ARF in the ED   Meds: No current facility-administered medications for this encounter.   Current Outpatient Prescriptions  Medication Sig Dispense Refill  . ALPRAZolam (XANAX) 0.25 MG tablet Take 1 tablet (0.25 mg total) by mouth at bedtime as needed.  90 tablet  1  . apixaban (ELIQUIS) 5 MG TABS tablet Take 1 tablet (5 mg total) by mouth 2 (two) times daily.  180 tablet  3  . Cod Liver Oil 1000 MG CAPS Take 1 capsule by mouth daily.        . DULoxetine  (CYMBALTA) 60 MG capsule Take 1 capsule (60 mg total) by mouth daily.  30 capsule  3  . GARLIC PO Take by mouth daily.      Marland Kitchen L-Tryptophan 500 MG CAPS Take 500 mg by mouth at bedtime.      . metolazone (ZAROXOLYN) 2.5 MG tablet Take 1 tablet (2.5 mg total) by mouth daily. Every other day  20 tablet  6  . Multiple Vitamins-Minerals (MULTIVITAMINS THER. W/MINERALS) TABS Take 1 tablet by mouth daily.        Marland Kitchen omeprazole (PRILOSEC) 20 MG capsule Take 1 capsule (20 mg total) by mouth 2 (two) times daily.  180 capsule  3  . Potassium Chloride ER 20 MEQ TBCR Take 20 mEq by mouth 2 (two) times daily.      Marland Kitchen torsemide (DEMADEX) 20 MG tablet Take 1 tablet (20 mg total) by mouth 2 (two) times daily.  180 tablet  3  . zinc gluconate 50 MG tablet Take 50 mg by mouth daily.        Allergies: Allergies as of 01/08/2013 - Review Complete 01/08/2013  Allergen Reaction Noted  . Menthol Hives 02/08/2011   Past Medical History  Diagnosis Date  . GERD (gastroesophageal reflux disease)   . Hypertension   .  Blind     Right enucleation  . Hearing loss   . Coronary artery disease     Minimal LAD stenosis 2007  . BPH (benign prostatic hypertrophy)   . Hyperlipidemia   . Varicose vein   . Peripheral neuropathy   . Tremor, essential   . Aortic sclerosis     2008  . Aortic stenosis     Mild, echo, January, 2014  . Atrial flutter     December, 2013  . Chronic anticoagulation     Apixaban started December 27, 2011  . Edema     December, 2013  . CHF (congestive heart failure)   . Hearing difficulty of both ears     hearing aids   Past Surgical History  Procedure Laterality Date  . Eye surgery    . Lung surgery      Benign lump removed  . Tumor excision      behindleft eye,abpve left eye,left foot  . Esophagogastroduodenoscopy N/A 06/23/2012    Procedure: ESOPHAGOGASTRODUODENOSCOPY (EGD);  Surgeon: Ladene Artist, MD;  Location: [facility];  Service: Endoscopy;  Laterality: N/A;  . Upper  gastrointestinal endoscopy    . Cataract extraction Bilateral    Family History  Problem Relation Age of Onset  . Cancer Father     Rectal  . Rectal cancer Father   . Cancer Mother     Breast  . Stomach cancer Neg Hx   . Esophageal cancer Neg Hx   . Colon cancer Neg Hx   . Cancer Sister    History   Social History  . Marital Status: Married    Spouse Name: N/A    Number of Children: 3  . Years of Education: 8th grade   Occupational History  . [employer]     Retired   Social History Main Topics  . Smoking status: Former Smoker    Types: Cigarettes    Quit date: 07/20/1975  . Smokeless tobacco: Never Used  . Alcohol Use: No  . Drug Use: No  . Sexual Activity: Not on file   Other Topics Concern  . Not on file   Social History Narrative   Patient has hostile neighbor with multiple police reports.  Patient is happily married, but son is at home and is not happy which creates anxious and frustrating relationships.    Review of Systems: Pertinent items are noted in HPI.  No CP/SOB/DOE. No lower extremity edema. + 2 pillow orthopnea.  Physical Exam: Blood pressure 126/41, pulse 48, temperature 97.6 F (36.4 C), temperature source Oral, resp. rate 16, height 5\' 5"  (3.875 m), weight 81.647 kg (180 lb), SpO2 96.00%. General: sitting up in bed eating lunch in NAD HEENT: right eye EOMI, left prosthetic Cardiac: RRR, no rubs, murmurs or gallops, no JVD Pulm: clear to auscultation bilaterally, moving normal volumes of air Abd: soft, nontender, nondistended, BS present, no suprapubic tenderness on exam Ext: warm and well perfused, no pedal edema, appearance of chronic venous stasis changes to lower ext Neuro: alert and oriented X3, 5/5 MMS upper and lower extremities  Lab results: Basic Metabolic Panel:  Recent Labs  01/08/13 1036 01/08/13 1231  NA 141 141  K 4.8 3.9  CL 98 98  CO2 30  --   GLUCOSE 131* 128*  BUN 39* 38*  CREATININE 3.03* 3.00*  CALCIUM  9.3  --    Liver Function Tests:  Recent Labs  01/08/13 1036  AST 20  ALT 8  ALKPHOS 93  BILITOT 3.1*  PROT 7.1  ALBUMIN 3.6    Recent Labs  01/08/13 1036  LIPASE 13   CBC:  Recent Labs  01/08/13 1036 01/08/13 1231  WBC 10.4  --   NEUTROABS 8.4*  --   HGB 15.3 15.6  HCT 44.6 46.0  MCV 87.3  --   PLT 128*  --     Recent Labs  01/08/13 1153  [place]  LABSPEC 1.013  PHURINE 5.5  GLUCOSEU NEGATIVE  HGBUR TRACE*  BILIRUBINUR NEGATIVE  KETONESUR NEGATIVE  PROTEINUR NEGATIVE  UROBILINOGEN 0.2  NITRITE NEGATIVE  LEUKOCYTESUR NEGATIVE   Assessment & Plan by Problem: 80 year old male with a PMH of HTN, HLD, CAD, atrial flutter (on Eliquis), diastolic HF (EF 56-38% Jan. 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  ARF:  Cr 3 from baseline of 1.1.  Likely post-obstructive in the setting of urinary retention. On diuretics but less likely pre-renal given he has been on metolazone and torsemide for over a year. - will not give fluids in the setting of HF, will instead hold metolazone and torsemide - check urine - Na, creatinine, nirtogen - daily BMP  Acute urinary retention: Likely due to BPH  - Continue Foley catheter - start Proscar - consult Urology in the AM for likely outpatient follow-up  BPH:  Not currently on BPH medication.  Says he thinks he was on Flomax in the past but not sure.  He says he has history of cataract in Right eye but has had surgery.  Flomax associated with floppy iris so will not start it in case he needs further ophtho intervention.  - add Proscar  Atrial flutter:  On Eliquis 5mg  BID.   - Will decrease dose to 2.5mg  BID given current ARF  - once creatinine below 1.5 can increase back to home dose  Diastolic HF and right sided heart failure:  EF of 60-65% but patient with moderately dilated right heart.  He denies DOE, PND or lower extremity  edema. - holding diuretics in the setting of ARF  HTN:  Holding diuretics in the setting of ARF and low BP. - monitor BP as Proscar may cause orthostatic hypotension   Peripheral neuropathy:  Continue Cymbalta.  GERD:  Continue Prilosec.  VTE ppx:  On Eliquis.  Dispo: Disposition is deferred at this time, awaiting improvement of current medical problems. Anticipated discharge in approximately 1 day(s).   The patient does have a current PCP [name], MD) and does need an [facility] hospital follow-up appointment after discharge.  The patient does not have transportation limitations that hinder transportation to clinic appointments.  Signed: Duwaine Maxin, DO 01/08/2013, 1:05 PM

## 2013-01-09 DIAGNOSIS — N400 Enlarged prostate without lower urinary tract symptoms: Secondary | ICD-10-CM

## 2013-01-09 DIAGNOSIS — N179 Acute kidney failure, unspecified: Principal | ICD-10-CM

## 2013-01-09 DIAGNOSIS — R339 Retention of urine, unspecified: Secondary | ICD-10-CM

## 2013-01-09 LAB — BASIC METABOLIC PANEL
BUN: 37 mg/dL — AB (ref 6–23)
CO2: 29 meq/L (ref 19–32)
Calcium: 8.7 mg/dL (ref 8.4–10.5)
Chloride: 99 mEq/L (ref 96–112)
Creatinine, Ser: 1.99 mg/dL — ABNORMAL HIGH (ref 0.50–1.35)
GFR calc non Af Amer: 30 mL/min — ABNORMAL LOW (ref >90)
GFR, EST AFRICAN AMERICAN: 35 mL/min — AB (ref >90)
GLUCOSE: 116 mg/dL — AB (ref 70–99)
POTASSIUM: 3.6 meq/L — AB (ref 3.7–5.3)
Sodium: 141 mEq/L (ref 137–147)

## 2013-01-09 LAB — CBC
HCT: 41.5 % (ref 39.0–52.0)
HEMOGLOBIN: 14.3 g/dL (ref 13.0–17.0)
MCH: 30.7 pg (ref 26.0–34.0)
MCHC: 34.5 g/dL (ref 30.0–36.0)
MCV: 89.1 fL (ref 78.0–100.0)
Platelets: 109 10*3/uL — ABNORMAL LOW (ref 150–400)
RBC: 4.66 MIL/uL (ref 4.22–5.81)
RDW: 13.9 % (ref 11.5–15.5)
WBC: 8.7 10*3/uL (ref 4.0–10.5)

## 2013-01-09 LAB — UREA NITROGEN, URINE: Urea Nitrogen, Ur: 454 mg/dL

## 2013-01-09 LAB — PSA: PSA: 7.85 ng/mL — ABNORMAL HIGH (ref <=4.00)

## 2013-01-09 MED ORDER — TERAZOSIN HCL 1 MG PO CAPS
1.0000 mg | ORAL_CAPSULE | Freq: Once | ORAL | Status: AC
  Administered 2013-01-09: 1 mg via ORAL
  Filled 2013-01-09: qty 1

## 2013-01-09 MED ORDER — TERAZOSIN HCL 1 MG PO CAPS: 1.0000 mg | ORAL_CAPSULE | Freq: Every day | ORAL | Status: DC

## 2013-01-09 MED ORDER — FINASTERIDE 5 MG PO TABS: 5.0000 mg | ORAL_TABLET | Freq: Every day | ORAL | Status: DC

## 2013-01-09 MED ORDER — POTASSIUM CHLORIDE CRYS ER 10 MEQ PO TBCR
10.0000 meq | EXTENDED_RELEASE_TABLET | Freq: Once | ORAL | Status: AC
  Administered 2013-01-09: 10 meq via ORAL
  Filled 2013-01-09: qty 1

## 2013-01-09 MED ORDER — TERAZOSIN HCL 2 MG PO CAPS: 2.0000 mg | ORAL_CAPSULE | Freq: Every day | ORAL | Status: DC

## 2013-01-09 MED ORDER — POTASSIUM CHLORIDE CRYS ER 10 MEQ PO TBCR
10.0000 meq | EXTENDED_RELEASE_TABLET | Freq: Once | ORAL | Status: AC
  Administered 2013-01-09: 14:00:00 10 meq via ORAL
  Filled 2013-01-09 (×2): qty 1

## 2013-01-09 MED ORDER — TERAZOSIN HCL 1 MG PO CAPS
1.0000 mg | ORAL_CAPSULE | Freq: Once | ORAL | Status: AC
  Administered 2013-01-09: 1 mg via ORAL
  Filled 2013-01-09 (×2): qty 1

## 2013-01-09 NOTE — H&P (Signed)
Internal Medicine Attending Admission Note Date: 01/09/2013  Patient name: Keith Ellis Medical record number: [id number] Date of birth: 1929/03/30 Age: 80 y.o. Gender: male  I saw and evaluated the patient. I reviewed the resident's note and I agree with the resident's findings and plan as documented in the resident's note.  Mr. Keith Ellis is an 80 year old man with a history of hypertension, hyperlipidemia, coronary artery disease, atrial flutter, diastolic heart failure, and benign prostatic hypertrophy who presents with a 2 day history of acute urinary retention. Although he has a history of nocturia and having to strain to urinate he has never been unable to urinate until 2 days ago. Over the next 24 hours he developed increasing abdominal and back pain. He therefore presented to the emergency department. He was straight cathed in the emergency department and 1 L of urine was removed. A Foley catheter was left in place. After this, his pain completely resolved. He was noted to have a creatinine of 3 prior to relief of his obstruction. He was therefore admitted to the internal medicine teaching service for further evaluation.  He feels well as of this morning with no further back pain or abdominal pain. His creatinine dropped from 3 to 2 overnight with resolution of his obstruction. Rectal examination this morning revealed a high riding prostate that was not completely evaluable by the tip of the finger. What could be felt did not reveal any prostatic nodules. He was started on Proscar yesterday and terazosin today. The reason for the terazosin was to increase the rapidity in which his urinary symptoms could be managed medically. He will be discharged home with followup in his primary care clinic in approximately one week. At that point the need for continued Foley catheterization can be reassessed after a week's worth of Proscar and terazosin. Further improvement in his acute on chronic renal failure can  also be reassessed with a repeat creatinine at that time. A PSA was obtained, given his urinary retention, and the result is pending at the time of this dictation.

## 2013-01-09 NOTE — Progress Notes (Signed)
Pt d/c by charge RN.

## 2013-01-09 NOTE — Progress Notes (Addendum)
Subjective: Mr. Keith Ellis is feeling well this morning with Foley in place, no complaints.  After discussion, he would like to have repeat PSA sent.   Objective: Vital signs in last 24 hours: Filed Vitals:   01/08/13 1831 01/08/13 1845 01/08/13 2011 01/09/13 0639  BP: 128/33 133/38 118/43 128/40  Pulse: 54  55 55  Temp:   97.7 F (36.5 C) 97.7 F (36.5 C)  TempSrc:   Oral Oral  Resp:   17 18  Height:      Weight:    184 lb 12.8 oz (83.825 kg)  SpO2:   97% 96%   Weight change:   Intake/Output Summary (Last 24 hours) at 01/09/13 1032 Last data filed at 01/09/13 0757  Gross per 24 hour  Intake    600 ml  Output   3750 ml  Net  -3150 ml   PEX General: alert, cooperative, NAD HEENT: NCAT, left eye prosthesis Neck: supple, no lymphadenopathy Lungs: clear to ascultation bilaterally, normal work of respiration, no wheezes, rales, ronchi Heart: regular rate and rhythm, no murmurs, gallops, or rubs Abdomen: soft, non-tender, non-distended, normal bowel sounds Extremities: 2+ DP/PT pulses bilaterally, no cyanosis, clubbing, or edema Neurologic: alert & oriented X3, cranial nerves II-XII intact, strength grossly intact, sensation intact to light touch Rectal: no evidence of prostate enlargement   Lab Results: Basic Metabolic Panel:  Recent Labs Lab 01/08/13 1036 01/08/13 1231 01/08/13 1705 01/09/13 0411  NA 141 141  --  141  K 4.8 3.9  --  3.6*  CL 98 98  --  99  CO2 30  --   --  29  GLUCOSE 131* 128*  --  116*  BUN 39* 38*  --  37*  CREATININE 3.03* 3.00*  --  1.99*  CALCIUM 9.3  --   --  8.7  MG  --   --  1.9  --    Liver Function Tests:  Recent Labs Lab 01/08/13 1036  AST 20  ALT 8  ALKPHOS 93  BILITOT 3.1*  PROT 7.1  ALBUMIN 3.6    Recent Labs Lab 01/08/13 1036  LIPASE 13   CBC:  Recent Labs Lab 01/08/13 1036 01/08/13 1231 01/09/13 0411  WBC 10.4  --  8.7  NEUTROABS 8.4*  --   --   HGB 15.3 15.6 14.3  HCT 44.6 46.0 41.5  MCV 87.3  --  89.1   PLT 128*  --  109*   Coagulation:  Recent Labs Lab 01/08/13 1705  LABPROT 17.3*  INR 1.45   Urinalysis:  Recent Labs Lab 01/08/13 1153  COLORURINE YELLOW  LABSPEC 1.013  PHURINE 5.5  GLUCOSEU NEGATIVE  HGBUR TRACE*  BILIRUBINUR NEGATIVE  KETONESUR NEGATIVE  PROTEINUR NEGATIVE  UROBILINOGEN 0.2  NITRITE NEGATIVE  LEUKOCYTESUR NEGATIVE    Studies/Results: X-ray Chest Pa And Lateral   01/08/2013   CLINICAL DATA:  Weakness and shortness of breath today.  EXAM: CHEST  2 VIEW  COMPARISON:  12/31/2010  FINDINGS: Normal heart size and pulmonary vascularity. Interstitial changes in the lungs likely representing fibrosis. No focal airspace disease in the lungs. No blunting of costophrenic angles. No pneumothorax. Degenerative changes in the spine.  IMPRESSION: No active cardiopulmonary disease.   Electronically Signed   By: Lucienne Capers M.D.   On: 01/08/2013 21:16   Medications: I have reviewed the patient's current medications. Scheduled Meds: . apixaban  2.5 mg Oral BID  . DULoxetine  60 mg Oral Daily  . finasteride  5  mg Oral Daily  . pantoprazole  40 mg Oral Daily  . sodium chloride  3 mL Intravenous Q12H  . terazosin  1 mg Oral Once  . [START ON 01/10/2013] terazosin  1 mg Oral QHS   PRN Meds:.sodium chloride, acetaminophen, acetaminophen, ALPRAZolam, sodium chloride Assessment/Plan: ARF: Cr 3 on admission --> 1.99 today (baseline 1.1). Likely post-obstructive in the setting of urinary retention. May also be pre-renal component given FeNa 0.5%.  On same dose of diuretics for over a year.  No IVFs given heart failure.  - continue holding metolazone and torsemide through today; patient may restart tomorrow after discharge  Acute urinary retention: Likely due to BPH  - continue Foley catheter at discharge; patient will follow-up with PCP in 1 week to have removed (terazosin should be providing some relief by that time) - monitor Is and Os (possible post-obstructive  diuresis) - continue finasteride, add terazosin 2 mg PO qhs (will give two separate doses of 1 mg each today, assuming patient tolerates first well, given possible orthostatic hypotension) - urology outpatient follow-up arranged for 1/12  BPH: Not currently on BPH medication. He may have taken Flomax in past.  - continue finasteride, add terazosin per above - order repeat PSA  Atrial flutter: On Eliquis 5mg  BID at home.  Decreased dose to 2.5 mg BID through this morning.  - restart home dose at discharge  Diastolic HF and right sided heart failure: EF 60-65% but moderately dilated right heart. No DOE, PND or lower extremity edema.  - holding diuretics through today in the setting of ARF, will restart tomorrow at home doses  HTN: BP 120s/40s.   VTE PPX: On Eliquis.   Dispo:  Anticipated discharge today.   The patient does have a current PCP [name], MD) and does need an [facility] hospital follow-up appointment after discharge. PCP Dr. Joseph Art will be expecting patient on Friday 1/9 between 8a-3p (walk in clinic). PSA pending, BMP, Foley out at that time Urologist Dr. Tammi Klippel Monday 1/12 at 9:45a.    Marland KitchenServices Needed at time of discharge: Y = Yes, Blank = No PT:   OT:   RN:   Equipment:   Other:     LOS: 1 day   Ivin Poot, MD 01/09/2013, 10:32 AM

## 2013-01-09 NOTE — Discharge Instructions (Signed)
Please follow-up with both your primary doctor and your urologist at the appointment times given.   We have started you on two medicines called FINASTERIDE and TERAZOSIN which should help you urinate more easily.  You should restart your usual doses of diuretics (metolazone and torsemide) TOMORROW (do not take evening dose of torsemide tonight).   We have sent a repeat PSA blood test, and the results will be available at your follow-up appointment.  Your doctor will also remove the Foley catheter next week.  In the meantime, care instructions are below.  You will need a lab test to check your kidney function at that appointment as well.    Foley Catheter Care, Adult A Foley catheter is a soft, flexible tube that is placed into the bladder to drain urine. A Foley catheter may be inserted if:  You leak urine or are not able to control when you urinate (urinary incontinence).  You are not able to urinate when you need to (urinary retention).  You had prostate surgery or surgery on the genitals.  You have certain medical conditions, such as multiple sclerosis, dementia, or a spinal cord injury. If you are going home with a Foley catheter in place, follow the instructions below. TAKING CARE OF THE CATHETER 1. Wash your hands with soap and water. 2. Using mild soap and warm water on a clean washcloth:  Clean the area on your body closest to the catheter insertion site using a circular motion, moving away from the catheter. Never wipe toward the catheter because this could sweep bacteria up into the urethra and cause infection.  Remove all traces of soap. Pat the area dry with a clean towel. For males, reposition the foreskin. 3. Attach the catheter to your leg so there is no tension on the catheter. Use adhesive tape or a leg strap. If you are using adhesive tape, remove any sticky residue left behind by the previous tape you used. 4. Keep the drainage bag below the level of the bladder, but  keep it off the floor. 5. Check throughout the day to be sure the catheter is working and urine is draining freely. Make sure the tubing does not become kinked. 6. Do not pull on the catheter or try to remove it. Pulling could damage internal tissues. TAKING CARE OF THE DRAINAGE BAGS You will be given two drainage bags to take home. One is a large overnight drainage bag, and the other is a smaller leg bag that fits underneath clothing. You may wear the overnight bag at any time, but you should never wear the smaller leg bag at night. Follow the instructions below for how to empty, change, and clean your drainage bags. Emptying the Drainage Bag You must empty your drainage bag when it is    full or at least 2 3 times a day. 1. Wash your hands with soap and water. 2. Keep the drainage bag below your hips, below the level of your bladder. This stops urine from going back into the tubing and into your bladder. 3. Hold the dirty bag over the toilet or a clean container. 4. Open the pour spout at the bottom of the bag and empty the urine into the toilet or container. Do not let the pour spout touch the toilet, container, or any other surface. Doing so can place bacteria on the bag, which can cause an infection. 5. Clean the pour spout with a gauze pad or cotton ball that has rubbing alcohol on it.  6. Close the pour spout. 7. Attach the bag to your leg with adhesive tape or a leg strap. 8. Wash your hands well. Changing the Drainage Bag Change your drainage bag once a month or sooner if it starts to smell bad or look dirty. Below are steps to follow when changing the drainage bag. 1. Wash your hands with soap and water. 2. Pinch off the rubber catheter so that urine does not spill out. 3. Disconnect the catheter tube from the drainage tube at the connection valve. Do not let the tubes touch any surface. 4. Clean the end of the catheter tube with an alcohol wipe. Use a different alcohol wipe to clean  the end of the drainage tube. 5. Connect the catheter tube to the drainage tube of the clean drainage bag. 6. Attach the new bag to the leg with adhesive tape or a leg strap. Avoid attaching the new bag too tightly. 7. Wash your hands well. Cleaning the Drainage Bag 1. Wash your hands with soap and water. 2. Wash the bag in warm, soapy water. 3. Rinse the bag thoroughly with warm water. 4. Fill the bag with a solution of white vinegar and water (1 cup vinegar to 1 qt warm water [.2 L vinegar to 1 L warm water]). Close the bag and soak it for 30 minutes in the solution. 5. Rinse the bag with warm water. 6. Hang the bag to dry with the pour spout open and hanging downward. 7. Store the clean bag (once it is dry) in a clean plastic bag. 8. Wash your hands well. PREVENTING INFECTION  Wash your hands before and after handling your catheter.  Take showers daily and wash the area where the catheter enters your body. Do not take baths. Replace wet leg straps with dry ones, if this applies.  Do not use powders, sprays, or lotions on the genital area. Only use creams, lotions, or ointments as directed by your caregiver.  For females, wipe from front to back after each bowel movement.  Drink enough fluids to keep your urine clear or pale yellow unless you have a fluid restriction.  Do not let the drainage bag or tubing touch or lie on the floor.  Wear cotton underwear to absorb moisture and to keep your skin drier. SEEK MEDICAL CARE IF:   Your urine is cloudy or smells unusually bad.  Your catheter becomes clogged.  You are not draining urine into the bag or your bladder feels full.  Your catheter starts to leak. SEEK IMMEDIATE MEDICAL CARE IF:   You have pain, swelling, redness, or pus where the catheter enters the body.  You have pain in the abdomen, legs, lower back, or bladder.  You have a fever.  You see blood fill the catheter, or your urine is pink or red.  You have nausea,  vomiting, or chills.  Your catheter gets pulled out. MAKE SURE YOU:   Understand these instructions.  Will watch your condition.  Will get help right away if you are not doing well or get worse. Document Released: 12/25/2004 Document Revised: 04/21/2012 Document Reviewed: 12/17/2011 [facility] Patient Information 2014 [place].   Benign Prostatic Hypertrophy  The prostate gland is part of the reproductive system of men. A normal prostate is about the size and shape of a walnut. The prostate gland makes a fluid that is mixed with sperm to make semen. This gland surrounds the urethra and is located in front of the rectum and just below  the bladder. The bladder is where urine is stored. The urethra is the tube through which urine passes from the bladder to get out of the body. The prostate grows as a man ages. An enlarged prostate not caused by cancer is called benign prostatic hypertrophy (BPH). This is a common health problem in men over age 65. This condition is a normal part of aging. An enlarged prostate presses on the urethra. This makes it harder to pass urine. In the early stages of enlargement, the bladder can get by with a narrowed urethra by forcing the urine through. If the problem gets worse, medical or surgical treatment may be required.  This condition should be followed by your caregiver. Longstanding back pressure on the kidneys can cause infection. Back pressure and infection can progress to bladder damage and kidney (renal) failure. If needed, your caregiver may refer you to a specialist in kidney and prostate disease (urologist). CAUSES  The exact cause is not known.  SYMPTOMS   You are not able to completely empty your bladder.  Getting up often during the night to urinate.  Need to urinate frequently during the day.  Difficultly in starting urine flow.  Decrease in size and strength of the urine stream.  Dribbling after urination.  Pain on urination (more  common with infection).  Inability to pass your water. This needs immediate treatment. DIAGNOSIS  These tests will help your caregiver understand your problem:  Digital rectal exam (DRE). In a rectal exam, your caregiver checks your prostate by putting a gloved, lubricated finger into the rectum to feel the back of your prostate gland. This exam detects the size of the gland and abnormal lumps or growths.  Urinalysis (exam of the urine). This may include a culture if there is concern about infection.  Prostate Specific Antigen (PSA). This is a blood test used to screen for prostate cancer. It is not used alone for diagnosing prostate cancer.  Rectal ultrasound (sonogram). This test uses sound waves to electronically produce a "picture" of the prostate. It helps examine the prostate gland for cancer. TREATMENT  Mild symptoms may not need treatment. Simple observation and yearly exams may be all that is required. Medications and surgery are options for more severe problems. Your caregiver can help you make an informed decision for what is best. Two classes of medications are available for relief of prostate symptoms:  Medications that shrink the prostate. This helps relieve symptoms.  Uncommon side effects include problems with sexual function.  Medications to relax the muscle of the prostate. This also relieves the obstruction.  Side effects can include dizziness, fatigue, lightheadedness, and retrograde ejaculation (diminished volume of ejaculate). Several types of surgical treatments are available for relief of prostate symptoms:  Transurethral resection of the prostate (TURP). In this treatment, an instrument is inserted through opening at the tip of the penis. It is used to cut away pieces of the inner core of the prostate. The pieces are removed through the same opening of the penis. This removes the obstruction and helps get rid of the symptoms.  Transurethral incision (TUIP). In  this procedure, small cuts are made in the prostate. This lessens the prostates pressure on the urethra.  Transurethral microwave thermotherapy (TUMT). This procedure uses microwaves to create heat. The heat destroys and removes a small amount of prostate tissue.  Transurethral needle ablation (TUNA). This is a procedure that uses radio frequencies to do the same as TUMT.  Interstitial laser coagulation ([place]). This is  a procedure that uses a laser to do the same as TUMT and TUNA.  Transurethral electrovaporization (TUVP). This is a procedure that uses electrodes to do the same as the procedures listed above. Regardless of the method of treatment chosen, you and your caregiver will discuss the options. With this knowledge, you along with your caregiver can decide upon the best treatment for you. SEEK MEDICAL CARE IF:   You develop chills, fever of 100.5 F (38.1 C), or night sweats.  There is unexplained back pain.  Symptoms are not helped by medications prescribed.  You develop medication side effects.  Your urine becomes very dark or has a bad smell. SEEK IMMEDIATE MEDICAL CARE IF:   You are suddenly unable to urinate. This is an emergency. You should be seen immediately.  There are large amounts of blood or clots in the urine.  Your urinary problems become unmanageable.  You develop lightheadedness, severe dizziness, or you feel faint.  You develop moderate to severe low back or flank pain.  You develop chills or fever. Document Released: 12/25/2004 Document Revised: 03/19/2011 Document Reviewed: 07/10/2012 [facility] Patient Information 2014 [place], [place].

## 2013-01-09 NOTE — Discharge Summary (Signed)
Name: Keith Ellis MRN: [id number] DOB: 1929-08-14 80 y.o. PCP: Keith Haber, MD  Date of Admission: 01/08/2013 10:59 AM Date of Discharge: 01/09/2013 Attending Physician: Dr. Eppie Ellis  Discharge Diagnosis: Principal Problem:   Acute renal failure Active Problems:   Hypertension   Coronary artery disease   BPH (benign prostatic hyperplasia)   Hyperlipidemia   GERD (gastroesophageal reflux disease)   Peripheral neuropathy   Aortic stenosis   Atrial flutter   Chronic anticoagulation   Urinary retention  Discharge Medications:   Medication List         ALPRAZolam 0.25 MG tablet  Commonly known as:  XANAX  Take 1 tablet (0.25 mg total) by mouth at bedtime as needed.     apixaban 5 MG Tabs tablet  Commonly known as:  ELIQUIS  Take 1 tablet (5 mg total) by mouth 2 (two) times daily.     Cod Liver Oil 1000 MG Caps  Take 1 capsule by mouth daily.     DULoxetine 60 MG capsule  Commonly known as:  CYMBALTA  Take 1 capsule (60 mg total) by mouth daily.     ELDERBERRY PO  Take 1 capsule by mouth daily.     finasteride 5 MG tablet  Commonly known as:  PROSCAR  Take 1 tablet (5 mg total) by mouth daily.     GARLIC PO  Take by mouth daily.     metolazone 2.5 MG tablet  Commonly known as:  ZAROXOLYN  Take 1 tablet (2.5 mg total) by mouth daily. Every other day     multivitamins ther. w/minerals Tabs tablet  Take 1 tablet by mouth daily.     omeprazole 20 MG capsule  Commonly known as:  PRILOSEC  Take 1 capsule (20 mg total) by mouth 2 (two) times daily.     Potassium Chloride ER 20 MEQ Tbcr  Take 40 mEq by mouth 2 (two) times daily.     terazosin 2 MG capsule  Commonly known as:  HYTRIN  Take 1 capsule (2 mg total) by mouth at bedtime.     torsemide 20 MG tablet  Commonly known as:  DEMADEX  Take 1 tablet (20 mg total) by mouth 2 (two) times daily.     zinc gluconate 50 MG tablet  Take 50 mg by mouth daily.        Disposition and follow-up:     KeithKeith Ellis was discharged from [facility] in Stable condition.  At the hospital follow up visit please address:  1.  Discontinuation of Foley catheter  2.  Review PSA results  3.  Labs / imaging needed at time of follow-up: BMP (to follow-up Cr)  4.  Pending labs/ test needing follow-up: PSA  Follow-up Appointments: Follow-up Information   Follow up with Keith Haber, MD On 01/16/2013. (8a)    Specialty:  Family Medicine   Contact information:   [place] [address] [phone]       Follow up with Keith Frock, MD On 01/19/2013. (9:45a)    Specialty:  Urology   Contact information:   [facility]  [place] [address] [phone]       Discharge Instructions: Discharge Orders   Future Appointments Provider Department Dept Phone   06/18/2013 3:30 PM Keith Ducking, MD [facility] [phone]   Future Orders Complete By Expires   Call MD for:  persistant dizziness or light-headedness  As directed  Call MD for:  severe uncontrolled pain  As directed    Diet - low sodium heart healthy  As directed    Increase activity slowly  As directed       Consultations:  none  Procedures Performed:  X-ray Chest Pa And Lateral   01/08/2013   CLINICAL DATA:  Weakness and shortness of breath today.  EXAM: CHEST  2 VIEW  COMPARISON:  12/31/2010  FINDINGS: Normal heart size and pulmonary vascularity. Interstitial changes in the lungs likely representing fibrosis. No focal airspace disease in the lungs. No blunting of costophrenic angles. No pneumothorax. Degenerative changes in the spine.  IMPRESSION: No active cardiopulmonary disease.   Electronically Signed   By: Keith Ellis M.D.   On: 01/08/2013 21:16    Admission HPI:  Keith Ellis is an 80 year old male with a PMH of HTN, HLD, CAD, atrial flutter (on Eliquis), diastolic HF (EF 123456 Jan. 2014), aortic sclerosis,  pulmonary hypertension, BPH, GERD, peripheral neuropathy and left eye blindness. He presents with urinary retention that started two nights ago. He reports getting out of bed about 20 times that night to try and empty his bladder however he was unable to get any urine out. This continued yesterday and he began to experience abdominal and back pain. He denies hematuria, saddle anesthesia or gait dysfunction. He reports difficulty with urination in the past secondary to BPH but he has never experienced inability to void before. He denies ever requiring catheter insertion for retention and he says he has never had to see a Urologist.  In the ED: T 97.36F, RR20, SpO2 100%, HR55, BP180/51; 1L output and resolving abdominal pain after Foley insertion. Noted to have ARF in the ED    Hospital Course by problem list: 1. AKI: Cr 3.0 on admission --> 1.99 today (baseline 1.1).  Most likely post-obstructive in the setting of urinary retention.  May also be pre-renal component given FeNa 0.5%.  On same dose of diuretics (metolazone, torsemide) for over a year.  Diuretics were held while inpatient; no IVFs given heart failure.  Given drop in Cr by full point, we felt comfortable discharging him home with close PCP follow-up (BMP for Cr recheck in clinic next week).  Patient given instructions to continue holding diuretics through day of discharge then restart home doses following day (1/3).   2. Acute on chronic urinary retention: Likely due to BPH.  Foley catheter was inserted in ED and continued at discharge.  We started terazosin 2 mg PO qhs and finasteride 5 mg daily.  Patient will follow-up with PCP on 1/9 to have Foley removed (terazosin should be providing some relief by that time).  Urology (Keith Ellis) outpatient follow-up on 1/12.   3. BPH: Not currently on BPH medication though he may have taken Flomax in past.  Started terazosin and finasteride while inpatient per above.  No evidence of prostate enlargement on  rectal exam though patient noted to have high-riding prostate.  Repeat PSA pending.    4. Atrial flutter, chronic: Seen on EKG.  On Eliquis 5mg  BID at home. Decreased dose to 2.5 mg BID through morning of discharge given decreased renal clearance.  Patient instructed to restart home dose at discharge.   5. Diastolic HF and right sided heart failure: Stable.  Echo on 01/14/12 showed EF 60-65% but moderately dilated right heart. No DOE, PND or lower extremity edema.  Held diuretics through day of discharge per above (in the setting of AKI), instructed to  restart home doses following day (1/3).   6. HTN: Stable, BP 120s/40s.   Discharge Vitals:   BP 149/45  Pulse 54  Temp(Src) 97.7 F (36.5 C) (Oral)  Resp 18  Ht 5\' 5"  (1.651 m)  Wt 184 lb 12.8 oz (83.825 kg)  BMI 30.75 kg/m2  SpO2 96%  Discharge Labs:  No results found for this or any previous visit (from the past 24 hour(s)).  Signed: Ivin Poot, MD 01/12/2013, 2:12 PM   Time Spent on Discharge: 40 minutes Services Ordered on Discharge: none Equipment Ordered on Discharge: none

## 2013-01-09 NOTE — Progress Notes (Signed)
Utilization Review Completed Andriy Sherk J. Parris Cudworth, RN, BSN, NCM [phone]  

## 2013-01-09 NOTE — Progress Notes (Signed)
Pt lying in bed no complaints of pain or cob. Will continue to monitor

## 2013-01-23 NOTE — Discharge Summary (Signed)
PSA 7.85  Mr. Splitt was called with this result and asked to mention it to the Urologist at the Urology follow-up visit.

## 2013-02-07 ENCOUNTER — Other Ambulatory Visit (HOSPITAL_COMMUNITY): Payer: Self-pay | Admitting: Internal Medicine

## 2013-02-07 ENCOUNTER — Telehealth: Payer: Self-pay | Admitting: Family Medicine

## 2013-02-07 NOTE — Telephone Encounter (Signed)
Vance Gather called answering service regarding patient having fever, chills.  Reports recent issues with prostate and kidney issues.  Call returned with no answer.  Left message on voicemail [phone]) at 21:20 advising that office opens at 7:30am tomorrow.  If patient too ill to wait until a.m., advise ED evaluation tonight.

## 2013-02-09 ENCOUNTER — Other Ambulatory Visit (HOSPITAL_COMMUNITY): Payer: Self-pay | Admitting: Internal Medicine

## 2013-02-12 ENCOUNTER — Telehealth: Payer: Self-pay | Admitting: Cardiology

## 2013-02-12 NOTE — Telephone Encounter (Signed)
Received request from Nurse fax box, documents faxed for surgical clearance. To: [facility] Fax number: [phone] Attention: [phone]/[phone]

## 2013-02-13 ENCOUNTER — Other Ambulatory Visit: Payer: Self-pay | Admitting: Urology

## 2013-02-20 ENCOUNTER — Telehealth: Payer: Self-pay

## 2013-02-20 NOTE — Telephone Encounter (Signed)
Received fax and also call concerning PA for Eliquis. Asked [employer] to req PA from cardiologist, Dr Percival Spanish who manages atrial flutter and orig Rxd this med.

## 2013-02-23 ENCOUNTER — Other Ambulatory Visit (HOSPITAL_COMMUNITY): Payer: Self-pay | Admitting: Internal Medicine

## 2013-02-23 NOTE — Telephone Encounter (Signed)
Daughter in law, [name], called to check on this PA. Pt gave permission to talk to her and I advised that I had provided [employer] w/Dr Hochrein's office # since he started pt on this. [name] stated that she had orig told [facility] Falling this and they wanted to go through [place], but agreed he is more appropriate to address questions concerning med. She will contact Dr Percival Spanish.

## 2013-02-25 ENCOUNTER — Other Ambulatory Visit (HOSPITAL_COMMUNITY): Payer: Self-pay | Admitting: Internal Medicine

## 2013-03-04 ENCOUNTER — Encounter (HOSPITAL_COMMUNITY): Payer: Self-pay | Admitting: Pharmacy Technician

## 2013-03-05 NOTE — Progress Notes (Signed)
EKG, chest x ray 1/15 EPIC, last eccho 1/14 EP[facility]

## 2013-03-06 ENCOUNTER — Other Ambulatory Visit (HOSPITAL_COMMUNITY): Payer: Self-pay | Admitting: Internal Medicine

## 2013-03-06 ENCOUNTER — Encounter (HOSPITAL_COMMUNITY): Payer: Self-pay

## 2013-03-06 ENCOUNTER — Encounter (HOSPITAL_COMMUNITY)
Admission: RE | Admit: 2013-03-06 | Discharge: 2013-03-06 | Disposition: A | Discharge status: Home / Self Care | Payer: Medicare HMO | Source: Ambulatory Visit | Attending: Urology | Admitting: Urology

## 2013-03-06 DIAGNOSIS — Z01812 Encounter for preprocedural laboratory examination: Secondary | ICD-10-CM | POA: Insufficient documentation

## 2013-03-06 HISTORY — DX: Presence of urogenital implants: Z96.0

## 2013-03-06 HISTORY — DX: Major depressive disorder, single episode, unspecified: F32.9

## 2013-03-06 HISTORY — DX: Depression, unspecified: F32.A

## 2013-03-06 LAB — BASIC METABOLIC PANEL
BUN: 16 mg/dL (ref 6–23)
CHLORIDE: 99 meq/L (ref 96–112)
CO2: 28 meq/L (ref 19–32)
CREATININE: 1.04 mg/dL (ref 0.50–1.35)
Calcium: 9.3 mg/dL (ref 8.4–10.5)
GFR calc Af Amer: 76 mL/min — ABNORMAL LOW (ref >90)
GFR calc non Af Amer: 65 mL/min — ABNORMAL LOW (ref >90)
Glucose, Bld: 101 mg/dL — ABNORMAL HIGH (ref 70–99)
POTASSIUM: 4.2 meq/L (ref 3.7–5.3)
Sodium: 140 mEq/L (ref 137–147)

## 2013-03-06 LAB — CBC
HEMATOCRIT: 41 % (ref 39.0–52.0)
Hemoglobin: 14.1 g/dL (ref 13.0–17.0)
MCH: 29.6 pg (ref 26.0–34.0)
MCHC: 34.4 g/dL (ref 30.0–36.0)
MCV: 86.1 fL (ref 78.0–100.0)
Platelets: 173 10*3/uL (ref 150–400)
RBC: 4.76 MIL/uL (ref 4.22–5.81)
RDW: 13.8 % (ref 11.5–15.5)
WBC: 6.3 10*3/uL (ref 4.0–10.5)

## 2013-03-06 NOTE — Patient Instructions (Addendum)
      Your procedure is scheduled on:  03/11/13 [facility]  Report to [facility] at   0730    AM.  Call this number if you have problems the morning of surgery: [phone]        Do not eat food  Or drink :After Midnight. Tuesday NIGHT   Take these medicines the morning of surgery with A SIP OF WATER: Duloxetine(Cymbalta), Omeprazole(Prolisec)   .  Contacts, dentures or partial plates, or metal hairpins  can not be worn to surgery. Your family will be responsible for glasses, dentures, hearing aides while you are in surgery  Leave suitcase in the car. After surgery it may be brought to your room.  For patients admitted to the hospital, checkout time is 11:00 AM day of  discharge.                DO NOT WEAR JEWELRY, LOTIONS, POWDERS, OR PERFUMES.  WOMEN-- DO NOT SHAVE LEGS OR UNDERARMS FOR 48 HOURS BEFORE SHOWERS. MEN MAY SHAVE FACE.  Patients discharged the day of surgery will not be allowed to drive home. IF going home the day of surgery, you must have a driver and someone to stay with you for the first 24 hours  Name and phone number of your driver:  son                                                                                           FAILURE TO [place]                                                  Patient Signature _____________________________

## 2013-03-10 MED ORDER — GENTAMICIN SULFATE 40 MG/ML IJ SOLN
360.0000 mg | INTRAVENOUS | Status: AC
  Administered 2013-03-11: 360 mg via INTRAVENOUS
  Filled 2013-03-10: qty 9

## 2013-03-11 ENCOUNTER — Encounter (HOSPITAL_COMMUNITY)
Admission: RE | Disposition: A | Discharge status: Home / Self Care | Payer: Self-pay | Source: Ambulatory Visit | Attending: Urology

## 2013-03-11 ENCOUNTER — Observation Stay (HOSPITAL_COMMUNITY)
Admission: RE | Admit: 2013-03-11 | Discharge: 2013-03-12 | Disposition: A | Discharge status: Home / Self Care | Payer: Medicare HMO | Source: Ambulatory Visit | Attending: Urology | Admitting: Urology

## 2013-03-11 ENCOUNTER — Ambulatory Visit (HOSPITAL_COMMUNITY): Payer: Medicare HMO | Admitting: *Deleted

## 2013-03-11 ENCOUNTER — Encounter (HOSPITAL_COMMUNITY): Payer: Self-pay | Admitting: *Deleted

## 2013-03-11 ENCOUNTER — Encounter (HOSPITAL_COMMUNITY): Payer: Medicare HMO | Admitting: *Deleted

## 2013-03-11 DIAGNOSIS — F410 Panic disorder [episodic paroxysmal anxiety] without agoraphobia: Secondary | ICD-10-CM | POA: Insufficient documentation

## 2013-03-11 DIAGNOSIS — I861 Scrotal varices: Secondary | ICD-10-CM | POA: Insufficient documentation

## 2013-03-11 DIAGNOSIS — I4891 Unspecified atrial fibrillation: Secondary | ICD-10-CM | POA: Insufficient documentation

## 2013-03-11 DIAGNOSIS — Z7901 Long term (current) use of anticoagulants: Secondary | ICD-10-CM | POA: Insufficient documentation

## 2013-03-11 DIAGNOSIS — E785 Hyperlipidemia, unspecified: Secondary | ICD-10-CM | POA: Insufficient documentation

## 2013-03-11 DIAGNOSIS — N179 Acute kidney failure, unspecified: Secondary | ICD-10-CM | POA: Insufficient documentation

## 2013-03-11 DIAGNOSIS — I359 Nonrheumatic aortic valve disorder, unspecified: Secondary | ICD-10-CM | POA: Insufficient documentation

## 2013-03-11 DIAGNOSIS — K219 Gastro-esophageal reflux disease without esophagitis: Secondary | ICD-10-CM | POA: Insufficient documentation

## 2013-03-11 DIAGNOSIS — Z79899 Other long term (current) drug therapy: Secondary | ICD-10-CM | POA: Insufficient documentation

## 2013-03-11 DIAGNOSIS — Z87891 Personal history of nicotine dependence: Secondary | ICD-10-CM | POA: Insufficient documentation

## 2013-03-11 DIAGNOSIS — R338 Other retention of urine: Secondary | ICD-10-CM

## 2013-03-11 DIAGNOSIS — R259 Unspecified abnormal involuntary movements: Secondary | ICD-10-CM | POA: Insufficient documentation

## 2013-03-11 DIAGNOSIS — I2510 Atherosclerotic heart disease of native coronary artery without angina pectoris: Secondary | ICD-10-CM | POA: Insufficient documentation

## 2013-03-11 DIAGNOSIS — I10 Essential (primary) hypertension: Secondary | ICD-10-CM | POA: Insufficient documentation

## 2013-03-11 DIAGNOSIS — N138 Other obstructive and reflux uropathy: Principal | ICD-10-CM | POA: Insufficient documentation

## 2013-03-11 DIAGNOSIS — N401 Enlarged prostate with lower urinary tract symptoms: Secondary | ICD-10-CM | POA: Diagnosis present

## 2013-03-11 DIAGNOSIS — R339 Retention of urine, unspecified: Secondary | ICD-10-CM | POA: Insufficient documentation

## 2013-03-11 DIAGNOSIS — I509 Heart failure, unspecified: Secondary | ICD-10-CM | POA: Insufficient documentation

## 2013-03-11 DIAGNOSIS — Z9079 Acquired absence of other genital organ(s): Secondary | ICD-10-CM

## 2013-03-11 HISTORY — PX: TRANSURETHRAL RESECTION OF PROSTATE: SHX73

## 2013-03-11 LAB — HEMOGLOBIN AND HEMATOCRIT, BLOOD
HCT: 39.7 % (ref 39.0–52.0)
Hemoglobin: 13.7 g/dL (ref 13.0–17.0)

## 2013-03-11 SURGERY — TRANSURETHRAL RESECTION OF THE PROSTATE WITH GYRUS INSTRUMENTS
Anesthesia: General | Site: Penis

## 2013-03-11 MED ORDER — SODIUM CHLORIDE 0.9 % IR SOLN
Status: DC | PRN
  Administered 2013-03-11: 6000 mL
  Administered 2013-03-11: 3000 mL

## 2013-03-11 MED ORDER — METOLAZONE 2.5 MG PO TABS: 2.5000 mg | ORAL_TABLET | ORAL | Status: DC

## 2013-03-11 MED ORDER — ACETAMINOPHEN 500 MG PO TABS
1000.0000 mg | ORAL_TABLET | Freq: Four times a day (QID) | ORAL | Status: AC
  Administered 2013-03-11 – 2013-03-12 (×4): 1000 mg via ORAL
  Filled 2013-03-11 (×4): qty 2

## 2013-03-11 MED ORDER — FENTANYL CITRATE 0.05 MG/ML IJ SOLN: 25.0000 ug | INTRAMUSCULAR | Status: DC | PRN

## 2013-03-11 MED ORDER — 0.9 % SODIUM CHLORIDE (POUR BTL) OPTIME
TOPICAL | Status: DC | PRN
  Administered 2013-03-11: 1000 mL

## 2013-03-11 MED ORDER — POTASSIUM CHLORIDE CRYS ER 20 MEQ PO TBCR
20.0000 meq | EXTENDED_RELEASE_TABLET | Freq: Two times a day (BID) | ORAL | Status: DC
  Administered 2013-03-11 – 2013-03-12 (×2): 20 meq via ORAL
  Filled 2013-03-11 (×3): qty 1

## 2013-03-11 MED ORDER — HYDROMORPHONE HCL PF 1 MG/ML IJ SOLN: 0.5000 mg | INTRAMUSCULAR | Status: DC | PRN

## 2013-03-11 MED ORDER — PANTOPRAZOLE SODIUM 40 MG PO TBEC
40.0000 mg | DELAYED_RELEASE_TABLET | Freq: Every day | ORAL | Status: DC
  Administered 2013-03-12: 40 mg via ORAL
  Filled 2013-03-11: qty 1

## 2013-03-11 MED ORDER — LIDOCAINE HCL 1 % IJ SOLN
INTRAMUSCULAR | Status: DC | PRN
  Administered 2013-03-11: 50 mg via INTRADERMAL

## 2013-03-11 MED ORDER — ADULT MULTIVITAMIN W/MINERALS CH
1.0000 | ORAL_TABLET | Freq: Every day | ORAL | Status: DC
  Administered 2013-03-11 – 2013-03-12 (×2): 1 via ORAL
  Filled 2013-03-11 (×2): qty 1

## 2013-03-11 MED ORDER — FINASTERIDE 5 MG PO TABS
5.0000 mg | ORAL_TABLET | Freq: Every morning | ORAL | Status: DC
  Administered 2013-03-11 – 2013-03-12 (×2): 5 mg via ORAL
  Filled 2013-03-11 (×2): qty 1

## 2013-03-11 MED ORDER — DULOXETINE HCL 60 MG PO CPEP
60.0000 mg | ORAL_CAPSULE | Freq: Every morning | ORAL | Status: DC
  Administered 2013-03-12: 60 mg via ORAL
  Filled 2013-03-11: qty 1

## 2013-03-11 MED ORDER — PROPOFOL 10 MG/ML IV BOLUS
INTRAVENOUS | Status: DC | PRN
  Administered 2013-03-11: 160 mg via INTRAVENOUS

## 2013-03-11 MED ORDER — OXYBUTYNIN CHLORIDE 5 MG PO TABS
5.0000 mg | ORAL_TABLET | Freq: Three times a day (TID) | ORAL | Status: DC | PRN
  Filled 2013-03-11: qty 1

## 2013-03-11 MED ORDER — LACTATED RINGERS IV SOLN
INTRAVENOUS | Status: DC | PRN
  Administered 2013-03-11 (×2): via INTRAVENOUS

## 2013-03-11 MED ORDER — INFLUENZA VAC SPLIT QUAD 0.25 ML IM SUSP
0.2500 mL | INTRAMUSCULAR | Status: DC
  Filled 2013-03-11 (×2): qty 0.25

## 2013-03-11 MED ORDER — LACTATED RINGERS IV SOLN: INTRAVENOUS | Status: DC

## 2013-03-11 MED ORDER — ZOLPIDEM TARTRATE 5 MG PO TABS
5.0000 mg | ORAL_TABLET | Freq: Every evening | ORAL | Status: DC | PRN
  Administered 2013-03-11: 5 mg via ORAL
  Filled 2013-03-11: qty 1

## 2013-03-11 MED ORDER — LACTATED RINGERS IV SOLN
INTRAVENOUS | Status: DC
  Administered 2013-03-11: 1000 mL via INTRAVENOUS

## 2013-03-11 MED ORDER — ONDANSETRON HCL 4 MG/2ML IJ SOLN
INTRAMUSCULAR | Status: DC | PRN
  Administered 2013-03-11: 4 mg via INTRAVENOUS

## 2013-03-11 MED ORDER — FENTANYL CITRATE 0.05 MG/ML IJ SOLN
INTRAMUSCULAR | Status: AC
  Filled 2013-03-11: qty 2

## 2013-03-11 MED ORDER — DOCUSATE SODIUM 100 MG PO CAPS
100.0000 mg | ORAL_CAPSULE | Freq: Two times a day (BID) | ORAL | Status: DC
  Administered 2013-03-11 – 2013-03-12 (×2): 100 mg via ORAL
  Filled 2013-03-11 (×3): qty 1

## 2013-03-11 MED ORDER — ONDANSETRON HCL 4 MG/2ML IJ SOLN: 4.0000 mg | INTRAMUSCULAR | Status: DC | PRN

## 2013-03-11 MED ORDER — PHENYLEPHRINE HCL 10 MG/ML IJ SOLN
INTRAMUSCULAR | Status: AC
  Filled 2013-03-11: qty 1

## 2013-03-11 MED ORDER — GLYCOPYRROLATE 0.2 MG/ML IJ SOLN
INTRAMUSCULAR | Status: DC | PRN
  Administered 2013-03-11: 0.2 mg via INTRAVENOUS

## 2013-03-11 MED ORDER — KCL IN DEXTROSE-NACL 20-5-0.45 MEQ/L-%-% IV SOLN
INTRAVENOUS | Status: DC
  Administered 2013-03-11: 1000 mL via INTRAVENOUS
  Administered 2013-03-12: 05:00:00 via INTRAVENOUS
  Filled 2013-03-11 (×4): qty 1000

## 2013-03-11 MED ORDER — SENNA 8.6 MG PO TABS
1.0000 | ORAL_TABLET | Freq: Two times a day (BID) | ORAL | Status: DC
  Administered 2013-03-11 – 2013-03-12 (×2): 8.6 mg via ORAL
  Filled 2013-03-11 (×2): qty 1

## 2013-03-11 MED ORDER — TORSEMIDE 20 MG PO TABS
20.0000 mg | ORAL_TABLET | Freq: Two times a day (BID) | ORAL | Status: DC
  Administered 2013-03-11 – 2013-03-12 (×2): 20 mg via ORAL
  Filled 2013-03-11 (×4): qty 1

## 2013-03-11 MED ORDER — ONDANSETRON HCL 4 MG/2ML IJ SOLN
INTRAMUSCULAR | Status: AC
  Filled 2013-03-11: qty 2

## 2013-03-11 MED ORDER — SODIUM CHLORIDE 0.9 % IR SOLN
1000.0000 mL | Status: DC
  Administered 2013-03-11: 1000 mL

## 2013-03-11 MED ORDER — PROPOFOL 10 MG/ML IV BOLUS
INTRAVENOUS | Status: AC
  Filled 2013-03-11: qty 20

## 2013-03-11 MED ORDER — FENTANYL CITRATE 0.05 MG/ML IJ SOLN
INTRAMUSCULAR | Status: DC | PRN
  Administered 2013-03-11 (×2): 50 ug via INTRAVENOUS

## 2013-03-11 MED ORDER — PNEUMOCOCCAL 13-VAL CONJ VACC IM SUSP
0.5000 mL | INTRAMUSCULAR | Status: DC
  Filled 2013-03-11 (×2): qty 0.5

## 2013-03-11 MED ORDER — OXYCODONE HCL 5 MG PO TABS: 5.0000 mg | ORAL_TABLET | ORAL | Status: DC | PRN

## 2013-03-11 MED ORDER — LIDOCAINE HCL (CARDIAC) 20 MG/ML IV SOLN
INTRAVENOUS | Status: AC
Start: 2013-03-11 — End: 2013-03-11
  Filled 2013-03-11: qty 5

## 2013-03-11 SURGICAL SUPPLY — 20 items
BAG URINE DRAINAGE (UROLOGICAL SUPPLIES) ×3 IMPLANT
BAG URO CATCHER STRL LF (DRAPE) ×3 IMPLANT
CATH FOLEY 3WAY 30CC 22FR (CATHETERS) ×3 IMPLANT
DRAPE CAMERA CLOSED 9X96 (DRAPES) ×3 IMPLANT
ELECT BUTTON HF 24-28F 2 30DE (ELECTRODE) ×3 IMPLANT
ELECT LOOP MED HF 24F 12D (CUTTING LOOP) ×3 IMPLANT
ELECT LOOP MED HF 24F 12D CBL (CLIP) ×3 IMPLANT
ELECT RESECT VAPORIZE 12D CBL (ELECTRODE) ×3 IMPLANT
GLOVE BIOGEL M STRL SZ7.5 (GLOVE) ×3 IMPLANT
GOWN STRL REUS W/TWL XL LVL3 (GOWN DISPOSABLE) ×3 IMPLANT
HOLDER FOLEY CATH W/STRAP (MISCELLANEOUS) IMPLANT
IV NS IRRIG 3000ML ARTHROMATIC (IV SOLUTION) ×6 IMPLANT
KIT ASPIRATION TUBING (SET/KITS/TRAYS/PACK) IMPLANT
MANIFOLD NEPTUNE II (INSTRUMENTS) ×3 IMPLANT
PACK CYSTO (CUSTOM PROCEDURE TRAY) ×3 IMPLANT
PLUG CATH AND CAP STER (CATHETERS) IMPLANT
SYR 30ML LL (SYRINGE) IMPLANT
SYRINGE IRR TOOMEY STRL 70CC (SYRINGE) ×3 IMPLANT
TUBING CONNECTING 10 (TUBING) ×2 IMPLANT
TUBING CONNECTING 10' (TUBING) ×1

## 2013-03-11 NOTE — Anesthesia Preprocedure Evaluation (Addendum)
Anesthesia Evaluation  Patient identified by MRN, date of birth, ID band Patient awake    Reviewed: Allergy & Precautions, H&P , NPO status , Patient's Chart, lab work & pertinent test results  Airway Mallampati: II TM Distance: >3 FB Neck ROM: full    Dental  (+) Edentulous Upper, Missing, Dental Advisory Given Missing all front lower teeth:   Pulmonary neg pulmonary ROS, former smoker,  breath sounds clear to auscultation  Pulmonary exam normal       Cardiovascular hypertension, + CAD and +CHF + dysrhythmias Atrial Fibrillation + Valvular Problems/Murmurs AS Rhythm:regular Rate:Normal  LAD disease 2007.  Mild AS echo 2014.  ECG AF   Neuro/Psych negative neurological ROS  negative psych ROS   GI/Hepatic negative GI ROS, Neg liver ROS, GERD-  Medicated and Controlled,  Endo/Other  negative endocrine ROS  Renal/GU negative Renal ROS  negative genitourinary   Musculoskeletal   Abdominal   Peds  Hematology negative hematology ROS (+)   Anesthesia Other Findings   Reproductive/Obstetrics negative OB ROS                          Anesthesia Physical Anesthesia Plan  ASA: III  Anesthesia Plan: General   Post-op Pain Management:    Induction: Intravenous  Airway Management Planned: LMA  Additional Equipment:   Intra-op Plan:   Post-operative Plan:   Informed Consent: I have reviewed the patients History and Physical, chart, labs and discussed the procedure including the risks, benefits and alternatives for the proposed anesthesia with the patient or authorized representative who has indicated his/her understanding and acceptance.   Dental Advisory Given  Plan Discussed with: CRNA and Surgeon  Anesthesia Plan Comments:         Anesthesia Quick Evaluation

## 2013-03-11 NOTE — Care Management Note (Addendum)
    Page 1 of 1   03/12/2013     1:34:31 PM   CARE MANAGEMENT NOTE 03/12/2013  Patient:  TALBOT, MONARCH   Account Number:  [id number]  Date Initiated:  03/11/2013  Documentation initiated by:  Dessa Phi  Subjective/Objective Assessment:   80 Y/O M ADMITTED W/REFRACTORY PROSTATIC HYPERPLASIA W/URINARY RETENTION.     Action/Plan:   FROM HOME W/SPOUSE.HAS PCP,PHARMACY.   Anticipated DC Date:  03/12/2013   Anticipated DC Plan:  [place]  CM consult      Choice offered to / List presented to:             Status of service:  Completed, signed off Medicare Important Message given?   (If response is "NO", the following Medicare IM given date fields will be blank) Date Medicare IM given:   Date Additional Medicare IM given:    Discharge Disposition:  HOME/SELF CARE  Per UR Regulation:  Reviewed for med. necessity/level of care/duration of stay  If discussed at [place] of Stay Meetings, dates discussed:    Comments:  03/12/13 Fatih Stalvey RN,BSN NCM [phone] D/C HOME NO NEEDS OR ORDERS.  03/11/13 Aydin Hink RN,BSN NCM [phone] S/P TURP.NO ANTICIPATED D/C NEEDS.

## 2013-03-11 NOTE — Transfer of Care (Signed)
Immediate Anesthesia Transfer of Care Note  Patient: Keith Ellis  Procedure(s) Performed: Procedure(s): TRANSURETHRAL RESECTION OF THE PROSTATE WITH GYRUS INSTRUMENTS (N/A)  Patient Location: PACU  Anesthesia Type:General  Level of Consciousness: awake, alert , oriented and patient cooperative  Airway & Oxygen Therapy: Patient Spontanous Breathing and Patient connected to face mask oxygen  Post-op Assessment: Report given to PACU RN, Post -op Vital signs reviewed and stable and Patient moving all extremities  Post vital signs: Reviewed and stable  Complications: No apparent anesthesia complications

## 2013-03-11 NOTE — Brief Op Note (Signed)
03/11/2013  12:28 PM  PATIENT:  Keith Ellis  80 y.o. male  PRE-OPERATIVE DIAGNOSIS:  REFRACTORY PROSTATIC HYPERPLASIA WITH URINARY RETENTION  POST-OPERATIVE DIAGNOSIS:  REFRACTORY PROSTATIC HYPERPLASIA WITH URINARY RETENTION  PROCEDURE:  Procedure(s): TRANSURETHRAL RESECTION OF THE PROSTATE WITH GYRUS INSTRUMENTS (N/A)  SURGEON:  Surgeon(s) and Role:    * Alexis Frock, MD - Primary  PHYSICIAN ASSISTANT:   ASSISTANTS: none   ANESTHESIA:   general  EBL:  Total I/O In: 1000 [I.V.:1000] Out: 400 [Urine:400]  BLOOD ADMINISTERED:none  DRAINS: 23F 3 way foley to NS irrigation   LOCAL MEDICATIONS USED:  NONE  SPECIMEN:  Source of Specimen:  1 - prostate chips  DISPOSITION OF SPECIMEN:  PATHOLOGY  COUNTS:  YES  TOURNIQUET:  * No tourniquets in log *  DICTATION: .Other Dictation: Dictation Number [id number]  PLAN OF CARE: Admit for overnight observation  PATIENT DISPOSITION:  PACU - hemodynamically stable.   Delay start of Pharmacological VTE agent (>24hrs) due to surgical blood loss or risk of bleeding: yes

## 2013-03-11 NOTE — Anesthesia Postprocedure Evaluation (Signed)
  Anesthesia Post-op Note  Patient: Keith Ellis  Procedure(s) Performed: Procedure(s) (LRB): TRANSURETHRAL RESECTION OF THE PROSTATE WITH GYRUS INSTRUMENTS (N/A)  Patient Location: PACU  Anesthesia Type: General  Level of Consciousness: awake and alert   Airway and Oxygen Therapy: Patient Spontanous Breathing  Post-op Pain: mild  Post-op Assessment: Post-op Vital signs reviewed, Patient's Cardiovascular Status Stable, Respiratory Function Stable, Patent Airway and No signs of Nausea or vomiting  Last Vitals:  Filed Vitals:   03/11/13 1300  BP: 166/44  Pulse: 54  Temp:   Resp: 19    Post-op Vital Signs: stable   Complications: No apparent anesthesia complications

## 2013-03-11 NOTE — H&P (Signed)
Keith Ellis is an 80 y.o. male.    Chief Complaint: Pre-Op TURP  HPI:   1 - Lower Urinary Tract Symptoms / Urinary Retention  - Pt with long h/o moderate obsructive sympotms with weak stream, ocaional double void. Nocturia x 2-3 overall of mild bother. DRE 01/2013 60gm, smooth. PVR 01/2012 "18mL". PSA 11/2011 2.66 (not screeing).   Had been managed well on alpha blocker (terazosin 2mg ) daily for years, but then developed frank retention 01/2013 requiring brief admission and foley. Finasteride started. Failed trial of void x2.   2 - Bilateral Varicocele - Incidental on exam. Pt states same x many, many  years. Non-bothersome.   3 - Acute Renal Failure - Basleine Cr <1.5 x many years, with acute rise to 3.0 01/2013 at time of admission for retention, which quickly corrected to baseline with foley.   PMH sig for CHF, AS, Panic Attacks, CAD / Eliquus  Today Keith Ellis is seen to proceed with TURP. He has cardiac clearance and has been off his Eliquus as instructed. No interval fevers or foley problems. He began his Bactrim as instructed 2 days prior to reduce GU colonization.  Past Medical History  Diagnosis Date  . GERD (gastroesophageal reflux disease)   . Hypertension   . Blind     Right enucleation  . Hearing loss   . Coronary artery disease     Minimal LAD stenosis 2007  . BPH (benign prostatic hypertrophy)   . Hyperlipidemia   . Varicose vein   . Peripheral neuropathy   . Tremor, essential   . Aortic sclerosis     2008  . Aortic stenosis     Mild, echo, January, 2014  . Atrial flutter     December, 2013  . Chronic anticoagulation     Apixaban started December 27, 2011  . Edema     December, 2013  . CHF (congestive heart failure)   . Hearing difficulty of both ears     hearing aids  . Urine retention 01/08/2013  . Depression   . Urinary catheter in place     Past Surgical History  Procedure Laterality Date  . Lung surgery      Benign lump removed  . Tumor excision       behindleft eye,abpve left eye,left foot  . Esophagogastroduodenoscopy N/A 06/23/2012    Procedure: ESOPHAGOGASTRODUODENOSCOPY (EGD);  Surgeon: Ladene Artist, MD;  Location: [facility];  Service: Endoscopy;  Laterality: N/A;  . Upper gastrointestinal endoscopy    . Cataract extraction Right   . Eye surgery Left     removal eyeball    Family History  Problem Relation Age of Onset  . Cancer Father     Rectal  . Rectal cancer Father   . Cancer Mother     Breast  . Stomach cancer Neg Hx   . Esophageal cancer Neg Hx   . Colon cancer Neg Hx   . Cancer Sister    Social History:  reports that he quit smoking about 37 years ago. His smoking use included Cigarettes. He smoked 0.00 packs per day. He has never used smokeless tobacco. He reports that he does not drink alcohol or use illicit drugs.  Allergies:  Allergies  Allergen Reactions  . Menthol Hives    No prescriptions prior to admission    No results found for this or any previous visit (from the past 48 hour(s)). No results found.  Review of Systems  Constitutional: Negative.  Negative for fever  and chills.  HENT: Negative.   Eyes: Negative.   Respiratory: Negative.   Cardiovascular: Negative.   Gastrointestinal: Negative.   Genitourinary: Negative.   Musculoskeletal: Negative.   Skin: Negative.   Neurological: Negative.   Endo/Heme/Allergies: Negative.   Psychiatric/Behavioral: Negative.     There were no vitals taken for this visit. Physical Exam  Constitutional: He is oriented to person, place, and time. He appears well-developed and well-nourished.  HENT:  Head: Normocephalic and atraumatic.  Eyes: EOM are normal. Pupils are equal, round, and reactive to light.  Neck: Normal range of motion. Neck supple.  Cardiovascular: Normal rate.   Respiratory: Effort normal.  GI: Soft. Bowel sounds are normal.  Genitourinary: Penis normal.  No CVAT  Musculoskeletal: Normal range of motion.  Neurological: He is  alert and oriented to person, place, and time.  Skin: Skin is warm and dry.  Psychiatric: He has a normal mood and affect. His behavior is normal. Judgment and thought content normal.     Assessment/Plan    1 - Lower Urinary Tract Symptoms - Now med-refracotyr. Recent prior PVR's normal confirms functional bladder. Recent cysto with large trilobar hypertrophy and small posterior diverticulum.  We rediscussed options for medical refractory prostatic outlet obstruction including TURP, TUNA, TUMT, Green-Light, Ho-LEP, and simple prostatectomy with their respective risks, benefits, and long-term outcomes data. Pt has opted for TURP. We rediscussed the typical peri-operative course with overnight admission and discharge with foley in place with subsequent office voiding trial few days later. We rediscussed risks including bleeding, infection, incontinence, need for repeat procedures / tissue regrowth over time as well as rare risks including DVT, PE, MI, CVA and mortality.    2 - Bilateral Varicocele - non-bothersome, observe.   3 - Acute Renal Failure - back at baselie by most recent labs.   Nadege Carriger 03/11/2013, 6:25 AM

## 2013-03-11 NOTE — OR Nursing (Signed)
53f foley originally placed in patient at dr. Lucy Antigua' office in late january removed before surgery today.

## 2013-03-12 ENCOUNTER — Encounter (HOSPITAL_COMMUNITY): Payer: Self-pay | Admitting: Urology

## 2013-03-12 LAB — HEMOGLOBIN AND HEMATOCRIT, BLOOD
HEMATOCRIT: 37.9 % — AB (ref 39.0–52.0)
Hemoglobin: 12.6 g/dL — ABNORMAL LOW (ref 13.0–17.0)

## 2013-03-12 MED ORDER — SENNOSIDES-DOCUSATE SODIUM 8.6-50 MG PO TABS: 1.0000 | ORAL_TABLET | Freq: Two times a day (BID) | ORAL | Status: DC

## 2013-03-12 MED ORDER — CEPHALEXIN 250 MG PO CAPS: 250.0000 mg | ORAL_CAPSULE | Freq: Two times a day (BID) | ORAL | Status: DC

## 2013-03-12 MED ORDER — TRAMADOL HCL 50 MG PO TABS: 50.0000 mg | ORAL_TABLET | Freq: Four times a day (QID) | ORAL | Status: DC | PRN

## 2013-03-12 NOTE — Progress Notes (Signed)
UR completed 

## 2013-03-12 NOTE — Discharge Instructions (Signed)
1 - You may have urinary urgency (bladder spasms) and bloody urine on / off with catheter in place. This is normal. ° °2 - Call MD or go to ER for fever >102, severe pain / nausea / vomiting not relieved by medications, or acute change in medical status ° °

## 2013-03-12 NOTE — Discharge Summary (Signed)
Physician Discharge Summary  Patient ID: Keith Ellis MRN: [id number] DOB/AGE: 1929/06/09 80 y.o.  Admit date: 03/11/2013 Discharge date: 03/12/2013  Admission Diagnoses:    BPH (benign prostatic hypertrophy) with urinary retention  Discharge Diagnoses:  Active Problems:   BPH (benign prostatic hypertrophy) with urinary retention   Discharged Condition: good  Hospital Course:   Pt underwent transurethral resection of prostate on 3/4, the day of admission, without acute complicaitons. He was observed overnight on 4E on saline bladder irrigation which was titrated to off by the AM fo 3/5. By the afternoon of 3/5, the patient was ambulatory, tolerating diet, catheter working well w/o clots off irrigation, and felt to be adequate for discharge.   Consults: None  Significant Diagnostic Studies: labs: Hgb >10 at discharge (stable)  Treatments: surgery:  transurethral resection of prostate on 3/4,  Discharge Exam: Blood pressure 130/37, pulse 52, temperature 97.7 F (36.5 C), temperature source Axillary, resp. rate 18, height 5\' 7"  (1.702 m), weight 83.46 kg (183 lb 15.9 oz), SpO2 97.00%. General appearance: alert, cooperative, appears stated age and wife at bedside Head: Normocephalic, without obvious abnormality, atraumatic Throat: lips, mucosa, and tongue normal; teeth and gums normal Back: symmetric, no curvature. ROM normal. No CVA tenderness. Resp: no labored breathing Cardio: regular rate and rhythm, S1, S2 normal, no murmur, click, rub or gallop GI: soft, non-tender; bowel sounds normal; no masses,  no organomegaly Male genitalia: normal, foley c/d/i with yellow - light pink urine, no clots. foreskin reduced. Extremities: extremities normal, atraumatic, no cyanosis or edema Skin: Skin color, texture, turgor normal. No rashes or lesions Lymph nodes: Cervical, supraclavicular, and axillary nodes normal. Neurologic: Grossly normal Incision/Wound: as per above. Some mild weakness  and facial droop c/w prior CVA, stable.  Disposition: 01-Home or Self Care   Future Appointments Provider Department Dept Phone   06/18/2013 3:30 PM Kathrynn Ducking, MD [facility] [phone]       Medication List    STOP taking these medications       ciprofloxacin 500 MG tablet  Commonly known as:  CIPRO     Cod Liver Oil 1000 MG Caps     ELIQUIS 5 MG Tabs tablet  Generic drug:  apixaban      TAKE these medications       cephALEXin 250 MG capsule  Commonly known as:  KEFLEX  Take 1 capsule (250 mg total) by mouth 2 (two) times daily. X 7 days to prevent post-op infection     DULoxetine 60 MG capsule  Commonly known as:  CYMBALTA  Take 60 mg by mouth every morning.     ELDERBERRY PO  Take 1 capsule by mouth daily.     finasteride 5 MG tablet  Commonly known as:  PROSCAR  Take 5 mg by mouth every morning.     metolazone 2.5 MG tablet  Commonly known as:  ZAROXOLYN  Take 2.5 mg by mouth once a week.     multivitamin with minerals Tabs tablet  Take 1 tablet by mouth daily.     omeprazole 20 MG capsule  Commonly known as:  PRILOSEC  Take 20 mg by mouth 2 (two) times daily before a meal.     potassium chloride 10 MEQ tablet  Commonly known as:  K-DUR,KLOR-CON  Take 10 mEq by mouth 2 (two) times daily.     potassium chloride SA 20 MEQ tablet  Commonly known as:  K-DUR,KLOR-CON  Take 20 mEq by mouth 2 (two) times  daily.     senna-docusate 8.6-50 MG per tablet  Commonly known as:  Senokot-S  Take 1 tablet by mouth 2 (two) times daily. While taking pain meds to prevent constipation     torsemide 20 MG tablet  Commonly known as:  DEMADEX  Take 20 mg by mouth 2 (two) times daily.     traMADol 50 MG tablet  Commonly known as:  ULTRAM  Take 1 tablet (50 mg total) by mouth every 6 (six) hours as needed for moderate pain. Post-operatively     zinc gluconate 50 MG tablet  Take 50 mg by mouth daily.           Follow-up Information    Follow up with Alexis Frock, MD On 03/16/2013. (at 1PM for MD visit and catheter removal)    Specialty:  Urology   Contact information:   [facility]  [place] [address] [phone]       Signed: Alexis Frock 03/12/2013, 12:45 PM

## 2013-03-12 NOTE — Op Note (Signed)
Keith Ellis, Keith Ellis NO.:  [id number]  MEDICAL RECORD NO.:  [id number]  LOCATION:  25                         FACILITY:  [facility]  PHYSICIAN:  Alexis Frock, MD     DATE OF BIRTH:  [birth year]  DATE OF PROCEDURE:  03/11/2013 DATE OF DISCHARGE:                              OPERATIVE REPORT   PREOPERATIVE DIAGNOSIS:  Refractory prostatic hypertrophy with urinary retention.  POSTOPERATIVE DIAGNOSIS:  Refractory prostatic hypertrophy with urinary retention.  PROCEDURE:  Transurethral resection of the prostate.  ESTIMATED BLOOD LOSS:  200 mL.  COMPLICATIONS:  None.  SPECIMENS:  Prostate chips for permanent pathology.  FINDINGS: 1. Trilobar prostatic hypertrophy, median lobe containing     approximately 50% volume on the lateral lobes, 25% each. 2. Otherwise unremarkable urinary bladder.  DRAINS:  A 22-French 3-way Foley catheter to normal saline irrigation 1 drop per second, efflux light pink.  INDICATION:  Keith Ellis is a very pleasant 80 year old gentleman with a history of prostatic hypertrophy and obstructive urinary symptoms.  He has been previously managed very well on a combination of alpha blockers and 5 alpha reductase inhibitors.  He unfortunately developed frank retention.  He underwent trial of void times several, but was unable to.  His previous postvoid residuals have been minimal signifying likely preserved bladder function.  Office cystoscopy was performed, which revealed significant trilobar prostatic hypertrophy.  Options were discussed including chronic catheter versus intermittent catheter versus an outlet procedures including transurethral resection of the prostate, and he wished to proceed with the latter.  Informed consent was obtained and placed in the medical record.  The patient had been on culture specific antibiotics preoperatively.  PROCEDURE IN DETAIL:  The patient being Keith Ellis verified. Procedure being transurethral  resection of the prostate was confirmed. Procedure was carried out.  Time-out was performed.  Intravenous antibiotics were administered.  General LMA anesthesia was introduced. The patient placed into a low lithotomy position.  Sterile field was created by prepping and draping the patient's penis, perineum, and proximal thighs using iodine x3.  This was performed after removal of this current catheter.  Next, cystourethroscopy was performed using a 26- [place] [employer] continuous flow resectoscope sheath with visual obturator. Inspection of the anterior and posterior urethra revealed trilobar prostatic hypertrophy, again with the median lobe compressing approximately 50% of the intraluminal obstruction.  The bipolar resectoscope loop was then carefully advanced.  Ureteral orifices were inspected and well away from the planned area of resection.  Initial attention was directed to resect the median lobe.  Bipolar dissection was performed of the median lobe, carrying it down what appeared to be flush with the base of the bladder neck, this allowed better irrigation and again the ureteral orifices were inspected after these maneuvers and found to be uninjured.  Next, resection was performed of the lateral lobes first getting the 12 o'clock position from the area of the bladder neck towards the area of the verumontanum taking down what appeared to be the circular fibers of the prostatic capsule.  Next, the right lobe was resected followed by the left lobe and with the margins being bladder neck and verumontanum respectively.  Additional coagulation current  was used for hemostasis.  All prostate chips were irrigated and set aside for permanent pathology.  Final inspection revealed excellent hemostasis, complete resolution of all intraluminal obstructing tissue with excellent channel from the bladder neck to the verumontanum. Ureteral orifices were also inspected once again and found to be uninjured.   There was no evidence of bladder perforation.  Resectoscope was exchanged for a new 22-French 3-way Foley catheter and 30 mL of sterile water in the balloon normal saline irrigation.  Procedure was then terminated.  The patient tolerated the procedure well.  No immediate periprocedural complications.  The patient was taken to the postanesthesia care unit in a stable condition.          ______________________________ Alexis Frock, MD     TM/MEDQ  D:  03/11/2013  T:  03/12/2013  Job:  [id number]

## 2013-03-16 ENCOUNTER — Telehealth: Payer: Self-pay | Admitting: *Deleted

## 2013-03-16 NOTE — Telephone Encounter (Signed)
PA for eliquis approved through 01/08/2015

## 2013-03-25 ENCOUNTER — Telehealth: Payer: Self-pay

## 2013-03-25 NOTE — Telephone Encounter (Signed)
[facility] OVER A MONTH AGO ABOUT HIS MEDICATIONS TO DR Synetta Shadow. WANTED TO CHECK ON THE STATUS OF IT PLEASE CALL [phone]

## 2013-03-26 NOTE — Telephone Encounter (Signed)
Nothing scanned into chart.

## 2013-04-07 ENCOUNTER — Ambulatory Visit (INDEPENDENT_AMBULATORY_CARE_PROVIDER_SITE_OTHER): Payer: Medicare HMO | Admitting: Family Medicine

## 2013-04-07 VITALS — BP 170/44 | HR 85 | Temp 98.1°F | Resp 16 | Ht 67.0 in | Wt 192.0 lb

## 2013-04-07 DIAGNOSIS — I350 Nonrheumatic aortic (valve) stenosis: Secondary | ICD-10-CM

## 2013-04-07 DIAGNOSIS — M25539 Pain in unspecified wrist: Secondary | ICD-10-CM

## 2013-04-07 DIAGNOSIS — I359 Nonrheumatic aortic valve disorder, unspecified: Secondary | ICD-10-CM

## 2013-04-07 DIAGNOSIS — N289 Disorder of kidney and ureter, unspecified: Secondary | ICD-10-CM

## 2013-04-07 DIAGNOSIS — M109 Gout, unspecified: Secondary | ICD-10-CM

## 2013-04-07 DIAGNOSIS — R600 Localized edema: Secondary | ICD-10-CM

## 2013-04-07 DIAGNOSIS — R609 Edema, unspecified: Secondary | ICD-10-CM

## 2013-04-07 MED ORDER — METHYLPREDNISOLONE ACETATE 80 MG/ML IJ SUSP
80.0000 mg | Freq: Once | INTRAMUSCULAR | Status: AC
  Administered 2013-04-07: 80 mg via INTRAMUSCULAR

## 2013-04-07 NOTE — Progress Notes (Signed)
° °  Subjective:  This chart was scribed for [name], MD  by Ludger Nutting, ED Scribe. This patient was seen in room 1 and the patient's care was started 2:01 PM.    Patient ID: Keith Ellis, male    DOB: [birth year], 80 y.o.   MRN: [id number]  HPI HPI Comments: Keith Ellis is a 80 y.o. male who presents to [facility] complaining of 1 week of gradual onset, constant, gradually worsening swelling and pain to the right hand and wrist. He reports similar symptoms that occurred to the right elbow and right ankle.    Review of Systems  Musculoskeletal: Positive for arthralgias, joint swelling and myalgias.  Neurological: Negative for numbness.       Objective:   Physical Exam  Nursing note and vitals reviewed. Constitutional: He is oriented to person, place, and time. He appears well-developed and well-nourished.  HENT:  Head: Normocephalic and atraumatic.  Cardiovascular: Normal rate and regular rhythm.   Murmur (Low pitched murmur of aortic stenosis ) heard. Pulmonary/Chest: Effort normal. No respiratory distress. He has no wheezes. He has rales (Few rales bilaterally on inspiration). He exhibits no tenderness.  Abdominal: He exhibits no distension.  Musculoskeletal:  Right hand and wrist: Grossly swollen with tenderness over the wrist. Skin is intact with good radial pulse and full ROM.   3+ bilateral tibial edema.   Neurological: He is alert and oriented to person, place, and time.  Skin: Skin is warm and dry.  Psychiatric: He has a normal mood and affect.        Assessment & Plan:    Wrist pain - Plan: methylPREDNISolone acetate (DEPO-MEDROL) injection 80 mg  Gout - Plan: methylPREDNISolone acetate (DEPO-MEDROL) injection 80 mg  Aortic stenosis  Renal insufficiency  Pedal edema  Signed, [name], MD

## 2013-04-09 ENCOUNTER — Ambulatory Visit (INDEPENDENT_AMBULATORY_CARE_PROVIDER_SITE_OTHER): Payer: Medicare HMO | Admitting: Family Medicine

## 2013-04-09 ENCOUNTER — Telehealth: Payer: Self-pay

## 2013-04-09 VITALS — BP 136/58 | HR 52 | Temp 97.4°F | Resp 16 | Ht 67.0 in | Wt 193.6 lb

## 2013-04-09 DIAGNOSIS — M109 Gout, unspecified: Secondary | ICD-10-CM

## 2013-04-09 LAB — POCT CBC
GRANULOCYTE PERCENT: 66.3 % (ref 37–80)
HCT, POC: 39.9 % — AB (ref 43.5–53.7)
Hemoglobin: 12.7 g/dL — AB (ref 14.1–18.1)
Lymph, poc: 1.5 (ref 0.6–3.4)
MCH, POC: 29.4 pg (ref 27–31.2)
MCHC: 31.8 g/dL (ref 31.8–35.4)
MCV: 92.3 fL (ref 80–97)
MID (CBC): 0.3 (ref 0–0.9)
MPV: 9.6 fL (ref 0–99.8)
PLATELET COUNT, POC: 190 10*3/uL (ref 142–424)
POC GRANULOCYTE: 3.5 (ref 2–6.9)
POC LYMPH %: 27.5 % (ref 10–50)
POC MID %: 6.2 %M (ref 0–12)
RBC: 4.32 M/uL — AB (ref 4.69–6.13)
RDW, POC: 14.9 %
WBC: 5.3 10*3/uL (ref 4.6–10.2)

## 2013-04-09 NOTE — Telephone Encounter (Signed)
Pt is going to RTC his hand is still bothering him.

## 2013-04-09 NOTE — Telephone Encounter (Signed)
Patient is wanting to follow up with Dr Joseph Art regarding his hand.  Patient states that it has gotten a little better but still very swollen.  Patient would like to have Dr Joseph Art call him back.   Best#: [phone]

## 2013-04-09 NOTE — Progress Notes (Addendum)
[facility] [address], [place] [address] [phone]  Date:  04/09/2013   Name:  Keith Ellis   DOB:  09/29/1929   MRN:  [id number]  PCP:  [name], MD    Chief Complaint: Joint Swelling   History of Present Illness:  Keith Ellis is a 80 y.o. very pleasant male patient who presents with the following:  He was here 2 days ago with wrist pain and was given a shot of depo- medrol for presumed gout.  He called because he then developed pain in his right index finger He reports he was told to come back in for "another shot."  It appears this may be a misunderstanding- he was told to come in for a recheck but probably was not told he would receive another shot. At any rate he is here for a recheck today.  His wrist is feeling better He has suffered from gout in various joints for some time.  He does have some tramadol at home that he can use as needed for pain which is left over from another time  Patient Active Problem List   Diagnosis Date Noted  . BPH (benign prostatic hypertrophy) with urinary retention 03/11/2013  . Urinary retention 01/08/2013  . AKI (acute kidney injury) 01/08/2013  . Acute renal failure 01/08/2013  . Other dysphagia 06/23/2012  . Foreign body in esophagus 06/23/2012  . Aortic stenosis   . Atrial flutter   . Chronic anticoagulation   . Edema   . Blind left eye 02/08/2011  . Hearing impaired 02/08/2011  . BPH (benign prostatic hyperplasia) 02/08/2011  . Hyperlipidemia 02/08/2011  . GERD (gastroesophageal reflux disease) 02/08/2011  . Peripheral neuropathy 02/08/2011  . Obese 07/20/2010  . Hypertension   . Coronary artery disease     Past Medical History  Diagnosis Date  . GERD (gastroesophageal reflux disease)   . Hypertension   . Blind     Right enucleation  . Hearing loss   . Coronary artery disease     Minimal LAD stenosis 2007  . BPH (benign prostatic hypertrophy)   . Hyperlipidemia   . Varicose vein    . Peripheral neuropathy   . Tremor, essential   . Aortic sclerosis     2008  . Aortic stenosis     Mild, echo, January, 2014  . Atrial flutter     December, 2013  . Chronic anticoagulation     Apixaban started December 27, 2011  . Edema     December, 2013  . CHF (congestive heart failure)   . Hearing difficulty of both ears     hearing aids  . Urine retention 01/08/2013  . Depression   . Urinary catheter in place     Past Surgical History  Procedure Laterality Date  . Lung surgery      Benign lump removed  . Tumor excision      behindleft eye,abpve left eye,left foot  . Esophagogastroduodenoscopy N/A 06/23/2012    Procedure: ESOPHAGOGASTRODUODENOSCOPY (EGD);  Surgeon: Ladene Artist, MD;  Location: [facility];  Service: Endoscopy;  Laterality: N/A;  . Upper gastrointestinal endoscopy    . Cataract extraction Right   . Eye surgery Left     removal eyeball  . Transurethral resection of prostate N/A 03/11/2013    Procedure: TRANSURETHRAL RESECTION OF THE PROSTATE WITH GYRUS INSTRUMENTS;  Surgeon: Alexis Frock, MD;  Location: [facility];  Service: Urology;  Laterality: N/A;    History  Substance Use Topics  . Smoking status: Former Smoker    Types: Cigarettes    Quit date: 07/20/1975  . Smokeless tobacco: Never Used  . Alcohol Use: No    Family History  Problem Relation Age of Onset  . Cancer Father     Rectal  . Rectal cancer Father   . Cancer Mother     Breast  . Stomach cancer Neg Hx   . Esophageal cancer Neg Hx   . Colon cancer Neg Hx   . Cancer Sister     Allergies  Allergen Reactions  . Menthol Hives    Medication list has been reviewed and updated.  Current Outpatient Prescriptions on File Prior to Visit  Medication Sig Dispense Refill  . DULoxetine (CYMBALTA) 60 MG capsule Take 60 mg by mouth every morning.      Marland Kitchen ELDERBERRY PO Take 1 capsule by mouth daily.      . finasteride (PROSCAR) 5 MG tablet Take 5 mg by mouth every morning.      .  metolazone (ZAROXOLYN) 2.5 MG tablet Take 2.5 mg by mouth once a week.      . Multiple Vitamin (MULTIVITAMIN WITH MINERALS) TABS tablet Take 1 tablet by mouth daily.      Marland Kitchen omeprazole (PRILOSEC) 20 MG capsule Take 20 mg by mouth 2 (two) times daily before a meal.       . potassium chloride SA (K-DUR,KLOR-CON) 20 MEQ tablet Take 20 mEq by mouth 2 (two) times daily.      Marland Kitchen senna-docusate (SENOKOT-S) 8.6-50 MG per tablet Take 1 tablet by mouth 2 (two) times daily. While taking pain meds to prevent constipation  30 tablet  0  . torsemide (DEMADEX) 20 MG tablet Take 20 mg by mouth 2 (two) times daily.       . traMADol (ULTRAM) 50 MG tablet Take 1 tablet (50 mg total) by mouth every 6 (six) hours as needed for moderate pain. Post-operatively  30 tablet  0  . zinc gluconate 50 MG tablet Take 50 mg by mouth daily.       No current facility-administered medications on file prior to visit.    Review of Systems:  As per HPI- otherwise negative.   Physical Examination: Filed Vitals:   04/09/13 1331  BP: 136/58  Pulse: 52  Temp: 97.4 F (36.3 C)  Resp: 16   Filed Vitals:   04/09/13 1331  Height: 5\' 7"  (1.702 m)  Weight: 193 lb 9.6 oz (87.816 kg)   Body mass index is 30.31 kg/(m^2). Ideal Body Weight: Weight in (lb) to have BMI = 25: 159.3  GEN: WDWN, NAD, Non-toxic, A & O x 3, overweight, looks well.  Here today with his wife HEENT: Atraumatic, Normocephalic. Neck supple. No masses, No LAD. Ears and Nose: No external deformity. CV: RRR, No M/G/R. No JVD. No thrill. No extra heart sounds. PULM: CTA B, no wheezes, crackles, rhonchi. No retractions. No resp. distress. No accessory muscle use. ABD: S, NT, ND, +BS. No rebound. No HSM. EXTR: No c/c/e NEURO Normal gait.  PSYCH: Normally interactive. Conversant. Not depressed or anxious appearing.  Calm demeanor.  His right index finger is slightly tender and swollen at the PIP.  No heat or sign or redness.  Normal cap refill and ROM except as  limited by chronic OA.    Results for orders placed in visit on 04/09/13  POCT CBC      Result Value Ref Range   WBC 5.3  4.6 - 10.2 K/uL   Lymph, poc 1.5  0.6 - 3.4   POC LYMPH PERCENT 27.5  10 - 50 %L   MID (cbc) 0.3  0 - 0.9   POC MID % 6.2  0 - 12 %M   POC Granulocyte 3.5  2 - 6.9   Granulocyte percent 66.3  37 - 80 %G   RBC 4.32 (*) 4.69 - 6.13 M/uL   Hemoglobin 12.7 (*) 14.1 - 18.1 g/dL   HCT, POC 39.9 (*) 43.5 - 53.7 %   MCV 92.3  80 - 97 fL   MCH, POC 29.4  27 - 31.2 pg   MCHC 31.8  31.8 - 35.4 g/dL   RDW, POC 14.9     Platelet Count, POC 190  142 - 424 K/uL   MPV 9.6  0 - 99.8 fL    Assessment and Plan: Gout - Plan: Uric Acid, POCT CBC  Explained that we generally would not do a second shot of depo- medrol so soon; apologized for this mis-understanding.  Did labs as above and will follow-up with his uric acid level.  In the meantime he can continue to use his tramadol as needed, let me know if any worsening or other changes   Signed Lamar Blinks, MD  04/10/13:  Called to give him his labs.  Uric acid is a bit high but he reports he is getting better.  Asked him to let me know if not continuing to improve

## 2013-04-09 NOTE — Patient Instructions (Signed)
I will be in touch with your labs.  For now use your tramadol as needed for pain.  You can also use ice on your finger.  Let me know if you are getting worse!

## 2013-04-10 ENCOUNTER — Encounter: Payer: Self-pay | Admitting: Family Medicine

## 2013-04-10 LAB — URIC ACID: Uric Acid, Serum: 8.2 mg/dL — ABNORMAL HIGH (ref 4.0–7.8)

## 2013-05-11 ENCOUNTER — Ambulatory Visit (INDEPENDENT_AMBULATORY_CARE_PROVIDER_SITE_OTHER): Payer: Medicare HMO | Admitting: Family Medicine

## 2013-05-11 ENCOUNTER — Encounter: Payer: Self-pay | Admitting: Family Medicine

## 2013-05-11 VITALS — BP 167/53 | HR 52 | Temp 97.0°F | Resp 16 | Ht 67.0 in | Wt 183.0 lb

## 2013-05-11 DIAGNOSIS — N289 Disorder of kidney and ureter, unspecified: Secondary | ICD-10-CM

## 2013-05-11 DIAGNOSIS — M109 Gout, unspecified: Secondary | ICD-10-CM

## 2013-05-11 LAB — COMPREHENSIVE METABOLIC PANEL
ALBUMIN: 4 g/dL (ref 3.5–5.2)
ALK PHOS: 97 U/L (ref 39–117)
ALT: 10 U/L (ref 0–53)
AST: 16 U/L (ref 0–37)
BILIRUBIN TOTAL: 1.5 mg/dL — AB (ref 0.2–1.2)
BUN: 27 mg/dL — ABNORMAL HIGH (ref 6–23)
CO2: 28 mEq/L (ref 19–32)
Calcium: 9.3 mg/dL (ref 8.4–10.5)
Chloride: 97 mEq/L (ref 96–112)
Creat: 1.17 mg/dL (ref 0.50–1.35)
Glucose, Bld: 143 mg/dL — ABNORMAL HIGH (ref 70–99)
Potassium: 3.6 mEq/L (ref 3.5–5.3)
Sodium: 137 mEq/L (ref 135–145)
Total Protein: 6.9 g/dL (ref 6.0–8.3)

## 2013-05-11 LAB — URIC ACID: URIC ACID, SERUM: 11 mg/dL — AB (ref 4.0–7.8)

## 2013-05-11 NOTE — Progress Notes (Addendum)
[facility] [address], [place] [address] [phone]  Date:  05/11/2013   Name:  Keith Ellis   DOB:  1929/11/19   MRN:  [id number]  PCP:  [name], MD    Chief Complaint: Follow-up   History of Present Illness:  [name] is a 80 y.o. very pleasant male patient who presents with the following:  He is here today for a recheck.  Seen about one month ago with gout.  His wrist is better.  He does have some pain in his elbows and his feet.  He also notes that his lower back has bothered him for about one week  He does follow his BP at home.  He checks it twice a day.  This am it was 136/67.  It generally runs 120- 140/ 80 at home He is otherwise feeling well today.    Patient Active Problem List   Diagnosis Date Noted  . BPH (benign prostatic hypertrophy) with urinary retention 03/11/2013  . Urinary retention 01/08/2013  . AKI (acute kidney injury) 01/08/2013  . Acute renal failure 01/08/2013  . Other dysphagia 06/23/2012  . Foreign body in esophagus 06/23/2012  . Aortic stenosis   . Atrial flutter   . Chronic anticoagulation   . Edema   . Blind left eye 02/08/2011  . Hearing impaired 02/08/2011  . BPH (benign prostatic hyperplasia) 02/08/2011  . Hyperlipidemia 02/08/2011  . GERD (gastroesophageal reflux disease) 02/08/2011  . Peripheral neuropathy 02/08/2011  . Obese 07/20/2010  . Hypertension   . Coronary artery disease     Past Medical History  Diagnosis Date  . GERD (gastroesophageal reflux disease)   . Hypertension   . Blind     Right enucleation  . Hearing loss   . Coronary artery disease     Minimal LAD stenosis 2007  . BPH (benign prostatic hypertrophy)   . Hyperlipidemia   . Varicose vein   . Peripheral neuropathy   . Tremor, essential   . Aortic sclerosis     2008  . Aortic stenosis     Mild, echo, January, 2014  . Atrial flutter     December, 2013  . Chronic anticoagulation     Apixaban started December 27, 2011  . Edema     December, 2013  . CHF (congestive heart failure)   . Hearing difficulty of both ears     hearing aids  . Urine retention 01/08/2013  . Depression   . Urinary catheter in place     Past Surgical History  Procedure Laterality Date  . Lung surgery      Benign lump removed  . Tumor excision      behindleft eye,abpve left eye,left foot  . Esophagogastroduodenoscopy N/A 06/23/2012    Procedure: ESOPHAGOGASTRODUODENOSCOPY (EGD);  Surgeon: Ladene Artist, MD;  Location: [facility];  Service: Endoscopy;  Laterality: N/A;  . Upper gastrointestinal endoscopy    . Cataract extraction Right   . Eye surgery Left     removal eyeball  . Transurethral resection of prostate N/A 03/11/2013    Procedure: TRANSURETHRAL RESECTION OF THE PROSTATE WITH GYRUS INSTRUMENTS;  Surgeon: Alexis Frock, MD;  Location: [facility];  Service: Urology;  Laterality: N/A;    History  Substance Use Topics  . Smoking status: Former Smoker    Types: Cigarettes    Quit date: 07/20/1975  . Smokeless tobacco: Never Used  . Alcohol Use: No    Family History  Problem  Relation Age of Onset  . Cancer Father     Rectal  . Rectal cancer Father   . Cancer Mother     Breast  . Stomach cancer Neg Hx   . Esophageal cancer Neg Hx   . Colon cancer Neg Hx   . Cancer Sister     Allergies  Allergen Reactions  . Menthol Hives    Medication list has been reviewed and updated.  Current Outpatient Prescriptions on File Prior to Visit  Medication Sig Dispense Refill  . DULoxetine (CYMBALTA) 60 MG capsule Take 60 mg by mouth every morning.      Marland Kitchen ELDERBERRY PO Take 1 capsule by mouth daily.      . finasteride (PROSCAR) 5 MG tablet Take 5 mg by mouth every morning.      . metolazone (ZAROXOLYN) 2.5 MG tablet Take 2.5 mg by mouth once a week.      . Multiple Vitamin (MULTIVITAMIN WITH MINERALS) TABS tablet Take 1 tablet by mouth daily.      Marland Kitchen omeprazole (PRILOSEC) 20 MG capsule Take 20 mg by mouth 2  (two) times daily before a meal.       . potassium chloride SA (K-DUR,KLOR-CON) 20 MEQ tablet Take 20 mEq by mouth 2 (two) times daily.      Marland Kitchen senna-docusate (SENOKOT-S) 8.6-50 MG per tablet Take 1 tablet by mouth 2 (two) times daily. While taking pain meds to prevent constipation  30 tablet  0  . torsemide (DEMADEX) 20 MG tablet Take 20 mg by mouth 2 (two) times daily.       . traMADol (ULTRAM) 50 MG tablet Take 1 tablet (50 mg total) by mouth every 6 (six) hours as needed for moderate pain. Post-operatively  30 tablet  0  . zinc gluconate 50 MG tablet Take 50 mg by mouth daily.       No current facility-administered medications on file prior to visit.    Review of Systems:  As per HPI- otherwise negative.   Physical Examination: Filed Vitals:   05/11/13 1158  BP: 167/53  Pulse: 52  Temp: 97 F (36.1 C)  Resp: 16   Filed Vitals:   05/11/13 1158  Height: 5\' 7"  (1.702 m)  Weight: 183 lb (83.008 kg)   Body mass index is 28.66 kg/(m^2). Ideal Body Weight: Weight in (lb) to have BMI = 25: 159.3  GEN: WDWN, NAD, Non-toxic, A & O x 3, looks well today HEENT: Atraumatic, Normocephalic. Neck supple. No masses, No LAD. Ears and Nose: No external deformity. CV: RRR, No M/G/R. No JVD. No thrill. No extra heart sounds. PULM: CTA B, no wheezes, crackles, rhonchi. No retractions. No resp. distress. No accessory muscle use. ABD: S, NT, ND, +BS. No rebound. No HSM. EXTR: No c/c/e NEURO Normal gait.  PSYCH: Normally interactive. Conversant. Not depressed or anxious appearing.  Calm demeanor.  Wrists and ankles are ok, no significant pedal edema Unable to reproduce LBP by pressing on his back.  He has normal BLE strength, sensation and DTR both extremities.  Normal flexion and extension of spine  Assessment and Plan: Gout - Plan: Uric Acid  Renal insufficiency - Plan: Comprehensive metabolic panel  Follow up uric acid and CMP today Will plan further follow- up pending labs. His BP is  well- controlled at home.  He will continue to monitor his BP and bring in his home cuff for a check at his neck visit so we can check it for accuracy.  However  given his excellent home readings will not adjust his medications today  Signed Lamar Blinks, MD  5/8: received labs.  Called and left message on machine- see letter from today for some info Results for orders placed in visit on 05/11/13  URIC ACID      Result Value Ref Range   Uric Acid, Serum 11.0 (*) 4.0 - 7.8 mg/dL  COMPREHENSIVE METABOLIC PANEL      Result Value Ref Range   Sodium 137  135 - 145 mEq/L   Potassium 3.6  3.5 - 5.3 mEq/L   Chloride 97  96 - 112 mEq/L   CO2 28  19 - 32 mEq/L   Glucose, Bld 143 (*) 70 - 99 mg/dL   BUN 27 (*) 6 - 23 mg/dL   Creat 1.17  0.50 - 1.35 mg/dL   Total Bilirubin 1.5 (*) 0.2 - 1.2 mg/dL   Alkaline Phosphatase 97  39 - 117 U/L   AST 16  0 - 37 U/L   ALT 10  0 - 53 U/L   Total Protein 6.9  6.0 - 8.3 g/dL   Albumin 4.0  3.5 - 5.2 g/dL   Calcium 9.3  8.4 - 10.5 mg/dL   His bilirubin is a little high, but is actually lower than it usually is.  Renal function is stable

## 2013-05-11 NOTE — Patient Instructions (Signed)
Great to see you today.  I will be in touch with your labs.  Call and schedule a follow-up with Dr. Alba Cory in the next few months.  The next time you visit a doctor please bring your home blood pressure cuff so it can be checked for accuracy

## 2013-05-13 ENCOUNTER — Encounter: Payer: Self-pay | Admitting: *Deleted

## 2013-05-15 ENCOUNTER — Encounter: Payer: Self-pay | Admitting: Family Medicine

## 2013-05-15 DIAGNOSIS — R339 Retention of urine, unspecified: Secondary | ICD-10-CM

## 2013-05-22 ENCOUNTER — Telehealth: Payer: Self-pay | Admitting: Cardiology

## 2013-05-22 NOTE — Telephone Encounter (Signed)
New problem    Pt's is trying to get med rec sent to the [place] in regards to why he was started on [place].   Pt's daughter in law [name] called and would like a call back please.

## 2013-05-22 NOTE — Telephone Encounter (Signed)
Dr Arlys John [place] [phone] fax number - send note that states why the pt was started on Eliquis.  Daughter aware I will locate office note and fax.   Dr Hochrein's office note from 12/27/2011 faxed to # left as requested.

## 2013-06-06 ENCOUNTER — Emergency Department (HOSPITAL_COMMUNITY)
Admission: EM | Admit: 2013-06-06 | Discharge: 2013-06-06 | Disposition: A | Discharge status: Home / Self Care | Payer: Medicare HMO | Attending: Emergency Medicine | Admitting: Emergency Medicine

## 2013-06-06 ENCOUNTER — Encounter (HOSPITAL_COMMUNITY): Payer: Self-pay | Admitting: Emergency Medicine

## 2013-06-06 DIAGNOSIS — Z79899 Other long term (current) drug therapy: Secondary | ICD-10-CM | POA: Insufficient documentation

## 2013-06-06 DIAGNOSIS — I509 Heart failure, unspecified: Secondary | ICD-10-CM | POA: Insufficient documentation

## 2013-06-06 DIAGNOSIS — I2510 Atherosclerotic heart disease of native coronary artery without angina pectoris: Secondary | ICD-10-CM | POA: Insufficient documentation

## 2013-06-06 DIAGNOSIS — I10 Essential (primary) hypertension: Secondary | ICD-10-CM | POA: Insufficient documentation

## 2013-06-06 DIAGNOSIS — R319 Hematuria, unspecified: Secondary | ICD-10-CM

## 2013-06-06 DIAGNOSIS — G609 Hereditary and idiopathic neuropathy, unspecified: Secondary | ICD-10-CM | POA: Insufficient documentation

## 2013-06-06 DIAGNOSIS — Z87891 Personal history of nicotine dependence: Secondary | ICD-10-CM | POA: Insufficient documentation

## 2013-06-06 DIAGNOSIS — Z7902 Long term (current) use of antithrombotics/antiplatelets: Secondary | ICD-10-CM | POA: Insufficient documentation

## 2013-06-06 DIAGNOSIS — H9190 Unspecified hearing loss, unspecified ear: Secondary | ICD-10-CM | POA: Insufficient documentation

## 2013-06-06 DIAGNOSIS — F3289 Other specified depressive episodes: Secondary | ICD-10-CM | POA: Insufficient documentation

## 2013-06-06 DIAGNOSIS — K219 Gastro-esophageal reflux disease without esophagitis: Secondary | ICD-10-CM | POA: Insufficient documentation

## 2013-06-06 DIAGNOSIS — F329 Major depressive disorder, single episode, unspecified: Secondary | ICD-10-CM | POA: Insufficient documentation

## 2013-06-06 DIAGNOSIS — N390 Urinary tract infection, site not specified: Secondary | ICD-10-CM

## 2013-06-06 DIAGNOSIS — H543 Unqualified visual loss, both eyes: Secondary | ICD-10-CM | POA: Insufficient documentation

## 2013-06-06 DIAGNOSIS — Z789 Other specified health status: Secondary | ICD-10-CM | POA: Insufficient documentation

## 2013-06-06 DIAGNOSIS — I4892 Unspecified atrial flutter: Secondary | ICD-10-CM | POA: Insufficient documentation

## 2013-06-06 DIAGNOSIS — N400 Enlarged prostate without lower urinary tract symptoms: Secondary | ICD-10-CM | POA: Insufficient documentation

## 2013-06-06 LAB — CBC
HCT: 39.1 % (ref 39.0–52.0)
HCT: 41.7 % (ref 39.0–52.0)
HEMOGLOBIN: 13.6 g/dL (ref 13.0–17.0)
Hemoglobin: 13 g/dL (ref 13.0–17.0)
MCH: 30.3 pg (ref 26.0–34.0)
MCH: 29.6 pg (ref 26.0–34.0)
MCHC: 33.2 g/dL (ref 30.0–36.0)
MCHC: 32.6 g/dL (ref 30.0–36.0)
MCV: 91.1 fL (ref 78.0–100.0)
MCV: 90.8 fL (ref 78.0–100.0)
Platelets: 107 10*3/uL — ABNORMAL LOW (ref 150–400)
RBC: 4.29 MIL/uL (ref 4.22–5.81)
RBC: 4.59 MIL/uL (ref 4.22–5.81)
RDW: 14 % (ref 11.5–15.5)
RDW: 14.1 % (ref 11.5–15.5)
WBC: 2.2 10*3/uL — ABNORMAL LOW (ref 4.0–10.5)
WBC: 4.2 10*3/uL (ref 4.0–10.5)

## 2013-06-06 LAB — URINALYSIS, ROUTINE W REFLEX MICROSCOPIC
GLUCOSE, UA: NEGATIVE mg/dL
KETONES UR: 15 mg/dL — AB
Nitrite: NEGATIVE
PROTEIN: 100 mg/dL — AB
Specific Gravity, Urine: 1.023 (ref 1.005–1.030)
Urobilinogen, UA: 1 mg/dL (ref 0.0–1.0)
pH: 5 (ref 5.0–8.0)

## 2013-06-06 LAB — BASIC METABOLIC PANEL
BUN: 19 mg/dL (ref 6–23)
CALCIUM: 9 mg/dL (ref 8.4–10.5)
CO2: 24 meq/L (ref 19–32)
Chloride: 108 mEq/L (ref 96–112)
Creatinine, Ser: 0.96 mg/dL (ref 0.50–1.35)
GFR calc Af Amer: 88 mL/min — ABNORMAL LOW (ref >90)
GFR calc non Af Amer: 76 mL/min — ABNORMAL LOW (ref >90)
GLUCOSE: 78 mg/dL (ref 70–99)
Potassium: 5.3 mEq/L (ref 3.7–5.3)
SODIUM: 140 meq/L (ref 137–147)

## 2013-06-06 LAB — URINE MICROSCOPIC-ADD ON

## 2013-06-06 MED ORDER — CIPROFLOXACIN HCL 500 MG PO TABS: 500.0000 mg | ORAL_TABLET | Freq: Two times a day (BID) | ORAL | Status: DC

## 2013-06-06 MED ORDER — CIPROFLOXACIN HCL 500 MG PO TABS
500.0000 mg | ORAL_TABLET | Freq: Once | ORAL | Status: AC
  Administered 2013-06-06: 500 mg via ORAL
  Filled 2013-06-06: qty 1

## 2013-06-06 NOTE — ED Notes (Signed)
CBC redrawn

## 2013-06-06 NOTE — ED Notes (Signed)
Per pt sts significant amount of hematuria since yesterday. sts recent prostate surgery. sts 3 weeks of back pain .

## 2013-06-06 NOTE — ED Provider Notes (Signed)
CSN: [id number]     Arrival date & time 06/06/13  1157 History   First MD Initiated Contact with Patient 06/06/13 1215     Chief Complaint  Patient presents with  . Hematuria     (Consider location/radiation/quality/duration/timing/severity/associated sxs/prior Treatment) HPI Patient presents 3 weeks after procedure, now with hematuria.  Patient has no abdominal pain, no lightheadedness, no syncope. Over the past day he has had multiple episodes of bright red blood via urethra. Patient continues to have good urine production, with no difficulty initiating a movement. No clear precipitating, alleviating, exacerbating factors.  On Past Medical History  Diagnosis Date  . GERD (gastroesophageal reflux disease)   . Hypertension   . Blind     Right enucleation  . Hearing loss   . Coronary artery disease     Minimal LAD stenosis 2007  . BPH (benign prostatic hypertrophy)   . Hyperlipidemia   . Varicose vein   . Peripheral neuropathy   . Tremor, essential   . Aortic sclerosis     2008  . Aortic stenosis     Mild, echo, January, 2014  . Atrial flutter     December, 2013  . Chronic anticoagulation     Apixaban started December 27, 2011  . Edema     December, 2013  . CHF (congestive heart failure)   . Hearing difficulty of both ears     hearing aids  . Urine retention 01/08/2013  . Depression   . Urinary catheter in place    Past Surgical History  Procedure Laterality Date  . Lung surgery      Benign lump removed  . Tumor excision      behindleft eye,abpve left eye,left foot  . Esophagogastroduodenoscopy N/A 06/23/2012    Procedure: ESOPHAGOGASTRODUODENOSCOPY (EGD);  Surgeon: Ladene Artist, MD;  Location: [facility];  Service: Endoscopy;  Laterality: N/A;  . Upper gastrointestinal endoscopy    . Cataract extraction Right   . Eye surgery Left     removal eyeball  . Transurethral resection of prostate N/A 03/11/2013    Procedure: TRANSURETHRAL RESECTION OF THE PROSTATE  WITH GYRUS INSTRUMENTS;  Surgeon: Alexis Frock, MD;  Location: [facility];  Service: Urology;  Laterality: N/A;   Family History  Problem Relation Age of Onset  . Cancer Father     Rectal  . Rectal cancer Father   . Cancer Mother     Breast  . Stomach cancer Neg Hx   . Esophageal cancer Neg Hx   . Colon cancer Neg Hx   . Cancer Sister    History  Substance Use Topics  . Smoking status: Former Smoker    Types: Cigarettes    Quit date: 07/20/1975  . Smokeless tobacco: Never Used  . Alcohol Use: No    Review of Systems  Constitutional:       Per HPI, otherwise negative  HENT:       Per HPI, otherwise negative  Respiratory:       Per HPI, otherwise negative  Cardiovascular:       Per HPI, otherwise negative  Gastrointestinal: Negative for vomiting.  Endocrine:       Negative aside from HPI  Genitourinary:       Neg aside from HPI   Musculoskeletal:       Per HPI, otherwise negative  Skin: Negative.   Neurological: Negative for syncope.      Allergies  Menthol  Home Medications   Prior to Admission medications  Medication Sig Start Date End Date Taking? Authorizing Provider  DULoxetine (CYMBALTA) 60 MG capsule Take 60 mg by mouth every morning.    Historical Provider, MD  ELDERBERRY PO Take 1 capsule by mouth daily.    Historical Provider, MD  finasteride (PROSCAR) 5 MG tablet Take 5 mg by mouth every morning.    Historical Provider, MD  metolazone (ZAROXOLYN) 2.5 MG tablet Take 2.5 mg by mouth once a week.    Historical Provider, MD  Multiple Vitamin (MULTIVITAMIN WITH MINERALS) TABS tablet Take 1 tablet by mouth daily.    Historical Provider, MD  omeprazole (PRILOSEC) 20 MG capsule Take 20 mg by mouth 2 (two) times daily before a meal.     Historical Provider, MD  potassium chloride SA (K-DUR,KLOR-CON) 20 MEQ tablet Take 20 mEq by mouth 2 (two) times daily.    Historical Provider, MD  senna-docusate (SENOKOT-S) 8.6-50 MG per tablet Take 1 tablet by mouth 2 (two)  times daily. While taking pain meds to prevent constipation 03/12/13   Alexis Frock, MD  torsemide (DEMADEX) 20 MG tablet Take 20 mg by mouth 2 (two) times daily.     Historical Provider, MD  traMADol (ULTRAM) 50 MG tablet Take 1 tablet (50 mg total) by mouth every 6 (six) hours as needed for moderate pain. Post-operatively 03/12/13   Alexis Frock, MD  zinc gluconate 50 MG tablet Take 50 mg by mouth daily.    Historical Provider, MD   BP 158/46  Pulse 53  Temp(Src) 97.7 F (36.5 C) (Oral)  Resp 16  Wt 199 lb 4 oz (90.379 kg)  SpO2 99% Physical Exam  Nursing note and vitals reviewed. Constitutional: He is oriented to person, place, and time. He appears well-developed. No distress.  HENT:  Head: Normocephalic and atraumatic.  Eyes: Conjunctivae and EOM are normal.  Cardiovascular: Normal rate and regular rhythm.   Pulmonary/Chest: Effort normal. No stridor. No respiratory distress.  Abdominal: He exhibits no distension.  Musculoskeletal: He exhibits no edema.  Neurological: He is alert and oriented to person, place, and time.  Skin: Skin is warm and dry.  Psychiatric: He has a normal mood and affect.    ED Course  Procedures (including critical care time) Labs Review Labs Reviewed  URINALYSIS, ROUTINE W REFLEX MICROSCOPIC - Abnormal; Notable for the following:    Color, Urine RED (*)    APPearance TURBID (*)    Hgb urine dipstick LARGE (*)    Bilirubin Urine SMALL (*)    Ketones, ur 15 (*)    Protein, ur 100 (*)    Leukocytes, UA MODERATE (*)    All other components within normal limits  BASIC METABOLIC PANEL - Abnormal; Notable for the following:    GFR calc non Af Amer 76 (*)    GFR calc Af Amer 88 (*)    All other components within normal limits  CBC - Abnormal; Notable for the following:    WBC 2.2 (*)    All other components within normal limits  URINE MICROSCOPIC-ADD ON - Abnormal; Notable for the following:    Bacteria, UA FEW (*)    All other components within  normal limits  URINE CULTURE  CBC    Patient was previously taking anticoagulants, Eliquis  I reviewed the electronic medical record  2:49 PM Patient in no distress. Initial CBC clotted.  No new complaints.  Repeat CBC reassuring  MDM   Final diagnoses:  UTI (lower urinary tract infection)  Hematuria  Patient presents with hematuria, no pain, no fever. Patient does have history of recent prostate procedure, uncomplicated. Patient is hemodynamically stable, neurologically intact, awake, alert, remained stable throughout his emergency department course. Patient's    labs demonstrated urinary tract infection, with no substantial loss of hemoglobin. Patient was discharged to follow up with urology and primary care given the patient's use of Eliquis.  With no other evidence of ongoing bleeding, this medication was not immediately stop, but additional discussion is warranted with his physician.    Carmin Muskrat, MD 06/06/13 [phone]

## 2013-06-06 NOTE — Discharge Instructions (Signed)
As discussed, it is important that you follow up with your physician for continued management of your condition.  You need to discuss your use of Eliquis with your primary care physician, in addition to following up with your urologist.  If you develop any new, or concerning changes in your condition, please return to the emergency department immediately.   Hematuria, Adult Hematuria is blood in your urine. It can be caused by a bladder infection, kidney infection, prostate infection, kidney stone, or cancer of your urinary tract. Infections can usually be treated with medicine, and a kidney stone usually will pass through your urine. If neither of these is the cause of your hematuria, further workup to find out the reason may be needed. It is very important that you tell your health care provider about any blood you see in your urine, even if the blood stops without treatment or happens without causing pain. Blood in your urine that happens and then stops and then happens again can be a symptom of a very serious condition. Also, pain is not a symptom in the initial stages of many urinary cancers. HOME CARE INSTRUCTIONS   Drink lots of fluid, 3 4 quarts a day. If you have been diagnosed with an infection, cranberry juice is especially recommended, in addition to large amounts of water.  Avoid caffeine, tea, and carbonated beverages, because they tend to irritate the bladder.  Avoid alcohol because it may irritate the prostate.  Only take over-the-counter or prescription medicines for pain, discomfort, or fever as directed by your health care provider.  If you have been diagnosed with a kidney stone, follow your health care provider's instructions regarding straining your urine to catch the stone.  Empty your bladder often. Avoid holding urine for long periods of time.  After a bowel movement, women should cleanse front to back. Use each tissue only once.  Empty your bladder before and after  sexual intercourse if you are a male. SEEK MEDICAL CARE IF: You develop back pain, fever, a feeling of sickness in your stomach (nausea), or vomiting or if your symptoms are not better in 3 days. Return sooner if you are getting worse. SEEK IMMEDIATE MEDICAL CARE IF:   You have a persistent fever, with a temperature of 101.23F (38.8C) or greater.  You develop severe vomiting and are unable to keep the medicine down.  You develop severe back or abdominal pain despite taking your medicines.  You begin passing a large amount of blood or clots in your urine.  You feel extremely weak or faint, or you pass out. MAKE SURE YOU:   Understand these instructions.  Will watch your condition.  Will get help right away if you are not doing well or get worse. Document Released: 12/25/2004 Document Revised: 10/15/2012 Document Reviewed: 08/25/2012 [facility] Patient Information 2014 [place].

## 2013-06-07 LAB — URINE CULTURE
COLONY COUNT: NO GROWTH
Culture: NO GROWTH

## 2013-06-08 ENCOUNTER — Telehealth: Payer: Self-pay | Admitting: Cardiology

## 2013-06-08 NOTE — Telephone Encounter (Signed)
Pt calling because he stopped his Eliquis 2 days ago because he was passing blood in his urine.  He went to the ED for evaluation and was told he had a UTI.  He is now on an antibiotic.  He is going to restart his Eliquis.  He has not passed any blood in 2 days.  He will call back if further questions or concerns.

## 2013-06-08 NOTE — Telephone Encounter (Signed)
Patient had a scraping of his prostate. He has passed large amounts of blood. He even ended up in ER this past weekend. He would like to know when he should start his Eliquis back? Please call and advise.

## 2013-06-12 ENCOUNTER — Telehealth: Payer: Self-pay | Admitting: Neurology

## 2013-06-12 NOTE — Telephone Encounter (Signed)
Patient returning someones call °

## 2013-06-18 ENCOUNTER — Ambulatory Visit: Payer: Medicare Other | Admitting: Neurology

## 2013-06-19 ENCOUNTER — Encounter: Payer: Self-pay | Admitting: Adult Health

## 2013-06-19 ENCOUNTER — Ambulatory Visit (INDEPENDENT_AMBULATORY_CARE_PROVIDER_SITE_OTHER): Payer: Medicare HMO | Admitting: Adult Health

## 2013-06-19 ENCOUNTER — Telehealth: Payer: Self-pay | Admitting: Physician Assistant

## 2013-06-19 VITALS — BP 174/55 | HR 50 | Temp 97.8°F | Ht 67.0 in | Wt 198.5 lb

## 2013-06-19 DIAGNOSIS — G629 Polyneuropathy, unspecified: Secondary | ICD-10-CM

## 2013-06-19 DIAGNOSIS — G609 Hereditary and idiopathic neuropathy, unspecified: Secondary | ICD-10-CM

## 2013-06-19 NOTE — Progress Notes (Signed)
PATIENT: Keith Ellis DOB: 25-May-1929  REASON FOR VISIT: follow up HISTORY FROM: patient  HISTORY OF PRESENT ILLNESS: Mr. Keith Ellis is an 80 year old right-handed white male with a history of a peripheral neuropathy. He returns today for followup. The patient currently takes Cymbalta and is tolerating it well. At the previous visit patient was having significant pain in the left foot. Patient states that the medication has helped some. Patient states that when his shoes are off that is when it hurts the most. Patient describes it as a burning and tingling pain in his feet. States that when he is ambulating, at times he feels off balance. Denies falls.  Patient denies pain going up his legs. Patient thinks he may have had gout in his great toe on the right and left. Patient is concerned about his blood pressure. States that for the past several days it has been running SBP>150. Patient currently only takes torsemide. No new medical issues since last visit.  REVIEW OF SYSTEMS: Full 14 system review of systems performed and notable only for:  Constitutional: N/A  Eyes: N/A Ear/Nose/Throat: N/A  Skin: N/A  Cardiovascular: N/A  Respiratory: N/A  Gastrointestinal: N/A  Genitourinary: N/A Hematology/Lymphatic: N/A  Endocrine: N/A Musculoskeletal:N/A  Allergy/Immunology: N/A  Neurological: N/A Psychiatric: N/A Sleep: N/A   ALLERGIES: Allergies  Allergen Reactions  . Menthol Hives    HOME MEDICATIONS: Outpatient Prescriptions Prior to Visit  Medication Sig Dispense Refill  . apixaban (ELIQUIS) 5 MG TABS tablet Take 5 mg by mouth 2 (two) times daily.      . Coconut Oil 1000 MG CAPS Take 1,000 mg by mouth 2 (two) times daily.      . DULoxetine (CYMBALTA) 60 MG capsule Take 60 mg by mouth every morning.      Marland Kitchen ELDERBERRY PO Take 1 capsule by mouth daily.      . metolazone (ZAROXOLYN) 2.5 MG tablet Take 2.5 mg by mouth every Saturday.       . Multiple Vitamin (MULTIVITAMIN WITH  MINERALS) TABS tablet Take 1 tablet by mouth daily.      Marland Kitchen omeprazole (PRILOSEC) 20 MG capsule Take 20 mg by mouth 2 (two) times daily before a meal.       . Podiatric Products (GOLD BOND FOOT) CREA Apply 1 application topically daily as needed (for itching).      . potassium chloride (MICRO-K) 10 MEQ CR capsule Take 20 mEq by mouth 2 (two) times daily.      Marland Kitchen senna-docusate (SENOKOT-S) 8.6-50 MG per tablet Take 1 tablet by mouth 2 (two) times daily as needed for mild constipation.      . torsemide (DEMADEX) 20 MG tablet Take 20 mg by mouth 2 (two) times daily.       Marland Kitchen zinc gluconate 50 MG tablet Take 50 mg by mouth daily.      . ciprofloxacin (CIPRO) 500 MG tablet Take 1 tablet (500 mg total) by mouth 2 (two) times daily.  13 tablet  0   No facility-administered medications prior to visit.    PAST MEDICAL HISTORY: Past Medical History  Diagnosis Date  . GERD (gastroesophageal reflux disease)   . Hypertension   . Blind     Right enucleation  . Hearing loss   . Coronary artery disease     Minimal LAD stenosis 2007  . BPH (benign prostatic hypertrophy)   . Hyperlipidemia   . Varicose vein   . Peripheral neuropathy   . Tremor, essential   .  Aortic sclerosis     2008  . Aortic stenosis     Mild, echo, January, 2014  . Atrial flutter     December, 2013  . Chronic anticoagulation     Apixaban started December 27, 2011  . Edema     December, 2013  . CHF (congestive heart failure)   . Hearing difficulty of both ears     hearing aids  . Urine retention 01/08/2013  . Depression   . Urinary catheter in place     PAST SURGICAL HISTORY: Past Surgical History  Procedure Laterality Date  . Lung surgery      Benign lump removed  . Tumor excision      behindleft eye,abpve left eye,left foot  . Esophagogastroduodenoscopy N/A 06/23/2012    Procedure: ESOPHAGOGASTRODUODENOSCOPY (EGD);  Surgeon: Ladene Artist, MD;  Location: [facility];  Service: Endoscopy;  Laterality: N/A;  .  Upper gastrointestinal endoscopy    . Cataract extraction Right   . Eye surgery Left     removal eyeball  . Transurethral resection of prostate N/A 03/11/2013    Procedure: TRANSURETHRAL RESECTION OF THE PROSTATE WITH GYRUS INSTRUMENTS;  Surgeon: Alexis Frock, MD;  Location: [facility];  Service: Urology;  Laterality: N/A;    FAMILY HISTORY: Family History  Problem Relation Age of Onset  . Cancer Father     Rectal  . Rectal cancer Father   . Cancer Mother     Breast  . Stomach cancer Neg Hx   . Esophageal cancer Neg Hx   . Colon cancer Neg Hx   . Cancer Sister     SOCIAL HISTORY: History   Social History  . Marital Status: Married    Spouse Name: N/A    Number of Children: 3  . Years of Education: 8th grade   Occupational History  . [employer]     Retired   Social History Main Topics  . Smoking status: Former Smoker    Types: Cigarettes    Quit date: 07/20/1975  . Smokeless tobacco: Never Used  . Alcohol Use: No  . Drug Use: No  . Sexual Activity: Not on file   Other Topics Concern  . Not on file   Social History Narrative   Patient has hostile neighbor with multiple police reports.  Patient is happily married, but son is at home and is not happy which creates anxious and frustrating relationships.      PHYSICAL EXAM  Filed Vitals:   06/19/13 1422  BP: 174/55  Pulse: 50  Temp: 97.8 F (36.6 C)  TempSrc: Oral  Height: 5\' 7"  (1.702 m)  Weight: 198 lb 8 oz (90.039 kg)   Body mass index is 31.08 kg/(m^2).  Generalized: Well developed, in no acute distress  Skin: Edema 1+  Neurological examination  Mentation: Alert oriented to time, place, history taking. Follows all commands speech and language fluent Cranial nerve II-XII: extraocular movements intact.  Motor: The motor testing reveals 5 over 5 strength of all 4 extremities. Good symmetric motor tone is noted throughout.  Sensory: Sensory testing is intact to soft touch on all 4 extremities.  Patient is sensitive to pinprick sensation in feet bilaterally but not above the ankle.no evidence of extinction is noted.  Coordination: Cerebellar testing reveals good finger-nose-finger and heel-to-shin bilaterally.  Gait and station: Gait is slightly wide based. Tandem gait is unsteady. Romberg is negative. No drift is seen.  Reflexes: Deep tendon reflexes are symmetric but hyperreflexic   DIAGNOSTIC DATA (  LABS, IMAGING, TESTING) - I reviewed patient records, labs, notes, testing and imaging myself where available.  Lab Results  Component Value Date   WBC 4.2 06/06/2013   HGB 13.6 06/06/2013   HCT 41.7 06/06/2013   MCV 90.8 06/06/2013   PLT 107* 06/06/2013      Component Value Date/Time   NA 140 06/06/2013 1305   K 5.3 06/06/2013 1305   CL 108 06/06/2013 1305   CO2 24 06/06/2013 1305   GLUCOSE 78 06/06/2013 1305   BUN 19 06/06/2013 1305   CREATININE 0.96 06/06/2013 1305   CREATININE 1.17 05/11/2013 1248   CALCIUM 9.0 06/06/2013 1305   PROT 6.9 05/11/2013 1248   ALBUMIN 4.0 05/11/2013 1248   AST 16 05/11/2013 1248   ALT 10 05/11/2013 1248   ALKPHOS 97 05/11/2013 1248   BILITOT 1.5* 05/11/2013 1248   GFRNONAA 76* 06/06/2013 1305   GFRAA 88* 06/06/2013 1305    Lab Results  Component Value Date   TSH 3.974 11/27/2011      ASSESSMENT AND PLAN 80 y.o. year old male  has a past medical history of GERD (gastroesophageal reflux disease); Hypertension; Blind; Hearing loss; Coronary artery disease; BPH (benign prostatic hypertrophy); Hyperlipidemia; Varicose vein; Peripheral neuropathy; Tremor, essential; Aortic sclerosis; Aortic stenosis; Atrial flutter; Chronic anticoagulation; Edema; CHF (congestive heart failure); Hearing difficulty of both ears; Urine retention (01/08/2013); Depression; and Urinary catheter in place. here with:  1. Peripheral neuropathy  Patient continues to have burning sensation in feet. He reports that Cymbalta has been beneficial. Patient feels slightly off balance when he  ambulates. At this time he is able to ambulate without assistance. I recommended physical therapy to help with balance issues but patient deferred at this time. Patient's blood pressure was elevated today. He states that in the past 4 days it has been>150 SBP. I advised the patient to call his primary care physician or his cardiologist today to make them aware of the change in his blood pressure. I reinforced that the patient should call them today. Patient should followup in 6 months or sooner if needed   Ward Givens, MSN, NP-C 06/19/2013, 2:24 PM [facility] [address] [place], [place] [address] [phone]  Note: This document was prepared with digital dictation and possible smart phrase technology. Any transcriptional errors that result from this process are unintentional.

## 2013-06-19 NOTE — Telephone Encounter (Signed)
Keith Ellis was seen by his urologist today and was told his systolic blood pressure was high. He has been checking his blood pressure at home and has noticed his systolic blood pressure at times was greater than 170. His diastolic blood pressure was in the 50s and his heart rate has run in the 40s. He is compliant with his medications. He has been asymptomatic with this.  Advise TM that he should continue taking his Demadex 20 mg twice a day and his metolazone 2.5 mg weekly as prescribed.  Advised him I would leave a message with the office to contact him on Monday and give him an appointment with Dr. Percival Spanish or an extender.   He is to contact us or call 911 for any symptoms including dizziness, vision problems, chest pain or headaches.  Keith Ellis is in agreement with this as a plan. He is not having any chest pain, shortness of breath or other symptoms at this time.

## 2013-06-19 NOTE — Patient Instructions (Addendum)
Follow-up with primary care physician and cardiologist regarding blood pressure. I advise you call them today.   Neuropathic Pain We often think that pain has a physical cause. If we get rid of the cause, the pain should go away. Nerves themselves can also cause pain. It is called neuropathic pain, which means nerve abnormality. It may be difficult for the patients who have it and for the treating caregivers. Pain is usually described as acute (short-lived) or chronic (long-lasting). Acute pain is related to the physical sensations caused by an injury. It can last from a few seconds to many weeks, but it usually goes away when normal healing occurs. Chronic pain lasts beyond the typical healing time. With neuropathic pain, the nerve fibers themselves may be damaged or injured. They then send incorrect signals to other pain centers. The pain you feel is real, but the cause is not easy to find.  CAUSES  Chronic pain can result from diseases, such as diabetes and shingles (an infection related to chickenpox), or from trauma, surgery, or amputation. It can also happen without any known injury or disease. The nerves are sending pain messages, even though there is no identifiable cause for such messages.   Other common causes of neuropathy include diabetes, phantom limb pain, or Regional Pain Syndrome (RPS).  As with all forms of chronic back pain, if neuropathy is not correctly treated, there can be a number of associated problems that lead to a downward cycle for the patient. These include depression, sleeplessness, feelings of fear and anxiety, limited social interaction and inability to do normal daily activities or work.  The most dramatic and mysterious example of neuropathic pain is called "phantom limb syndrome." This occurs when an arm or a leg has been removed because of illness or injury. The brain still gets pain messages from the nerves that originally carried impulses from the missing limb. These  nerves now seem to misfire and cause troubling pain.  Neuropathic pain often seems to have no cause. It responds poorly to standard pain treatment. Neuropathic pain can occur after:  Shingles (herpes zoster virus infection).  A lasting burning sensation of the skin, caused usually by injury to a peripheral nerve.  Peripheral neuropathy which is widespread nerve damage, often caused by diabetes or alcoholism.  Phantom limb pain following an amputation.  Facial nerve problems (trigeminal neuralgia).  Multiple sclerosis.  Reflex sympathetic dystrophy.  Pain which comes with cancer and cancer chemotherapy.  Entrapment neuropathy such as when pressure is put on a nerve such as in carpal tunnel syndrome.  Back, leg, and hip problems (sciatica).  Spine or back surgery.  HIV Infection or AIDS where nerves are infected by viruses. Your caregiver can explain items in the above list which may apply to you. SYMPTOMS  Characteristics of neuropathic pain are:  Severe, sharp, electric shock-like, shooting, lightening-like, knife-like.  Pins and needles sensation.  Deep burning, deep cold, or deep ache.  Persistent numbness, tingling, or weakness.  Pain resulting from light touch or other stimulus that would not usually cause pain.  Increased sensitivity to something that would normally cause pain, such as a pinprick. Pain may persist for months or years following the healing of damaged tissues. When this happens, pain signals no longer sound an alarm about current injuries or injuries about to happen. Instead, the alarm system itself is not working correctly.  Neuropathic pain may get worse instead of better over time. For some people, it can lead to serious disability. It  is important to be aware that severe injury in a limb can occur without a proper, protective pain response.Burns, cuts, and other injuries may go unnoticed. Without proper treatment, these injuries can become infected  or lead to further disability. Take any injury seriously, and consult your caregiver for treatment. DIAGNOSIS  When you have a pain with no known cause, your caregiver will probably ask some specific questions:   Do you have any other conditions, such as diabetes, shingles, multiple sclerosis, or HIV infection?  How would you describe your pain? (Neuropathic pain is often described as shooting, stabbing, burning, or searing.)  Is your pain worse at any time of the day? (Neuropathic pain is usually worse at night.)  Does the pain seem to follow a certain physical pathway?  Does the pain come from an area that has missing or injured nerves? (An example would be phantom limb pain.)  Is the pain triggered by minor things such as rubbing against the sheets at night? These questions often help define the type of pain involved. Once your caregiver knows what is happening, treatment can begin. Anticonvulsant, antidepressant drugs, and various pain relievers seem to work in some cases. If another condition, such as diabetes is involved, better management of that disorder may relieve the neuropathic pain.  TREATMENT  Neuropathic pain is frequently long-lasting and tends not to respond to treatment with narcotic type pain medication. It may respond well to other drugs such as antiseizure and antidepressant medications. Usually, neuropathic problems do not completely go away, but partial improvement is often possible with proper treatment. Your caregivers have large numbers of medications available to treat you. Do not be discouraged if you do not get immediate relief. Sometimes different medications or a combination of medications will be tried before you receive the results you are hoping for. See your caregiver if you have pain that seems to be coming from nowhere and does not go away. Help is available.  SEEK IMMEDIATE MEDICAL CARE IF:   There is a sudden change in the quality of your pain, especially if  the change is on only one side of the body.  You notice changes of the skin, such as redness, black or purple discoloration, swelling, or an ulcer.  You cannot move the affected limbs. Document Released: 09/22/2003 Document Revised: 03/19/2011 Document Reviewed: 09/22/2003 [facility] Patient Information 2014 [place].

## 2013-06-19 NOTE — Progress Notes (Signed)
I have read the note, and I agree with the clinical assessment and plan.  Malaiah Viramontes KEITH   

## 2013-06-22 ENCOUNTER — Telehealth: Payer: Self-pay | Admitting: Cardiology

## 2013-06-22 NOTE — Telephone Encounter (Signed)
Spoke with pt - his BP has been anywhere between 153/50 to 177/50.  He will continue to monitor his BP.  He is scheduled for Friday to be seen back for further treatment of HTN

## 2013-06-22 NOTE — Telephone Encounter (Signed)
New problem   Pt stated he spoke to some one after hr on Friday because he is having problems with high bp. Pt would like to speak to nurse. Please call pt.

## 2013-06-25 ENCOUNTER — Other Ambulatory Visit: Payer: Self-pay | Admitting: Family Medicine

## 2013-06-26 ENCOUNTER — Ambulatory Visit (INDEPENDENT_AMBULATORY_CARE_PROVIDER_SITE_OTHER): Payer: Medicare HMO | Admitting: Cardiology

## 2013-06-26 ENCOUNTER — Encounter: Payer: Self-pay | Admitting: Cardiology

## 2013-06-26 VITALS — BP 157/58 | HR 52 | Ht 67.0 in | Wt 200.0 lb

## 2013-06-26 DIAGNOSIS — I10 Essential (primary) hypertension: Secondary | ICD-10-CM

## 2013-06-26 DIAGNOSIS — Z79899 Other long term (current) drug therapy: Secondary | ICD-10-CM

## 2013-06-26 MED ORDER — LISINOPRIL 10 MG PO TABS: 10.0000 mg | ORAL_TABLET | Freq: Every day | ORAL | Status: DC

## 2013-06-26 NOTE — Progress Notes (Signed)
HPI The patient presents for followup of diastolic HF.  Since I last saw him he has been doing okay from a cardiovascular standpoint. He weighs himself daily and is much more compliant with his diet and he watching his volume.He denies any chest pressure, neck or arm discomfort. He has had no palpitations, presyncope or syncope. He denies any PND or orthopnea.  He did have some hematuria and presented to the emergency room. This happened twice. In the ER I reviewed his record and he had no blood in urine. He saw the urologist who felt that there was no further workup indicated. He did have a urinary infection. He's had no further hematuria and remains on blood thinners. Of note his blood pressure elevated. Systolics have been consistently in the 160s.   Allergies  Allergen Reactions  . Menthol Hives    Current Outpatient Prescriptions  Medication Sig Dispense Refill  . apixaban (ELIQUIS) 5 MG TABS tablet Take 5 mg by mouth 2 (two) times daily.      . Coconut Oil 1000 MG CAPS Take 1,000 mg by mouth 2 (two) times daily.      . DULoxetine (CYMBALTA) 60 MG capsule Take 60 mg by mouth every morning.      Marland Kitchen ELDERBERRY PO Take 1 capsule by mouth daily.      . finasteride (PROSCAR) 5 MG tablet Take 1 tablet by mouth daily.      . metolazone (ZAROXOLYN) 2.5 MG tablet Take 2.5 mg by mouth every Saturday.       . Multiple Vitamin (MULTIVITAMIN WITH MINERALS) TABS tablet Take 1 tablet by mouth daily.      Marland Kitchen omeprazole (PRILOSEC) 20 MG capsule Take 20 mg by mouth 2 (two) times daily before a meal.       . Podiatric Products (GOLD BOND FOOT) CREA Apply 1 application topically daily as needed (for itching).      . potassium chloride (MICRO-K) 10 MEQ CR capsule Take 20 mEq by mouth 2 (two) times daily.      Marland Kitchen senna-docusate (SENOKOT-S) 8.6-50 MG per tablet Take 1 tablet by mouth 2 (two) times daily as needed for mild constipation.      . torsemide (DEMADEX) 20 MG tablet TAKE ONE TABLET BY MOUTH TWICE DAILY   180 tablet  0  . traMADol (ULTRAM) 50 MG tablet Take 1 tablet by mouth as needed.      . zinc gluconate 50 MG tablet Take 50 mg by mouth daily.       No current facility-administered medications for this visit.    Past Medical History  Diagnosis Date  . GERD (gastroesophageal reflux disease)   . Hypertension   . Blind     Right enucleation  . Hearing loss   . Coronary artery disease     Minimal LAD stenosis 2007  . BPH (benign prostatic hypertrophy)   . Hyperlipidemia   . Varicose vein   . Peripheral neuropathy   . Tremor, essential   . Aortic sclerosis     2008  . Aortic stenosis     Mild, echo, January, 2014  . Atrial flutter     December, 2013  . Chronic anticoagulation     Apixaban started December 27, 2011  . Edema     December, 2013  . CHF (congestive heart failure)   . Hearing difficulty of both ears     hearing aids  . Urine retention 01/08/2013  . Depression   . Urinary catheter  in place     Past Surgical History  Procedure Laterality Date  . Lung surgery      Benign lump removed  . Tumor excision      behindleft eye,abpve left eye,left foot  . Esophagogastroduodenoscopy N/A 06/23/2012    Procedure: ESOPHAGOGASTRODUODENOSCOPY (EGD);  Surgeon: Ladene Artist, MD;  Location: [facility];  Service: Endoscopy;  Laterality: N/A;  . Upper gastrointestinal endoscopy    . Cataract extraction Right   . Eye surgery Left     removal eyeball  . Transurethral resection of prostate N/A 03/11/2013    Procedure: TRANSURETHRAL RESECTION OF THE PROSTATE WITH GYRUS INSTRUMENTS;  Surgeon: Alexis Frock, MD;  Location: [facility];  Service: Urology;  Laterality: N/A;   ROS:  As stated in the HPI and negative for all other systems.  PHYSICAL EXAM BP 157/58  Pulse 52  Ht 5\' 7"  (1.702 m)  Wt 200 lb (90.719 kg)  BMI 31.32 kg/m2 GENERAL:  Well appearing NECK:  Jugular venous distention to the jaw at 45, waveform within normal limits, carotid upstroke brisk and symmetric, no  bruits, no thyromegaly LYMPHATICS:  No cervical, inguinal adenopathy LUNGS:  Clear to auscultation bilaterally BACK:  No CVA tenderness CHEST:  Unremarkable HEART:  PMI not displaced or sustained,S1 and S2 within normal limits, no S3, no S4,  no clicks, no rubs, harsh systolic murmur radiating out the outflow tract and mid to late peaking, no diastolic murmurs ABD:  Flat, positive bowel sounds normal in frequency in pitch, positive abdominal bruits, no rebound, no guarding, no midline pulsatile mass, positive  hepatomegaly, no splenomegaly, obese EXT:  2 plus pulses throughout,  Momoderate bilateral edema, no cyanosis no clubbing, venous stasis changes SKIN:  No rashes no nodules, mild bruising  EKG:  Sinus bradycardia, rate 49, first degree AV block, right bundle branch block, no acute ST-T wave changes. 06/26/2013  ASSESSMENT AND PLAN  Atrial flutter - He is in sinus rhythm now. He's tolerating anticoagulation. He does have some back pain which has been reported with Eliquis although I am not seeing this in my patients. I checked with our pharmacists who had not seen this either. At this point I would not be convinced that back pain is related to his Eliquis.  I think he should continue with this I did review the urology records and they saw no contraindication to anticoagulation as he is having no further hematuria  Aortic sclerosis -  He has very mild aortic stenosis. No change in therapy is indicated.  Hypertension -  His blood pressure remains elevated and has been consistently so. I reviewed his blood pressure diary from home. I will add lisinopril 2.5 mg daily but keep a close eye on his creatinine given his previous renal insufficiency.  Coronary artery disease -  He has no active symptoms. No change in therapy is indicated.  Pulmonary hypertension - The pulmonary hypertension is mild to moderate. No change in therapy is indicated. her

## 2013-06-26 NOTE — Patient Instructions (Signed)
Start Lisinopril 10 mg once a day. Continue all other medications as listed.  Please have blood work in 10 days.  (BMP)  Follow up with Dr Percival Spanish in 2 months.

## 2013-06-30 ENCOUNTER — Encounter: Payer: Self-pay | Admitting: Family Medicine

## 2013-06-30 ENCOUNTER — Other Ambulatory Visit (INDEPENDENT_AMBULATORY_CARE_PROVIDER_SITE_OTHER): Payer: Medicare HMO

## 2013-06-30 DIAGNOSIS — Z79899 Other long term (current) drug therapy: Secondary | ICD-10-CM

## 2013-06-30 DIAGNOSIS — I10 Essential (primary) hypertension: Secondary | ICD-10-CM

## 2013-06-30 LAB — BASIC METABOLIC PANEL
BUN: 27 mg/dL — AB (ref 6–23)
CO2: 28 mEq/L (ref 19–32)
CREATININE: 1.4 mg/dL (ref 0.4–1.5)
Calcium: 9.1 mg/dL (ref 8.4–10.5)
Chloride: 104 mEq/L (ref 96–112)
GFR: 53.36 mL/min — ABNORMAL LOW (ref >60.00)
Glucose, Bld: 126 mg/dL — ABNORMAL HIGH (ref 70–99)
Potassium: 4 mEq/L (ref 3.5–5.1)
Sodium: 141 mEq/L (ref 135–145)

## 2013-07-04 ENCOUNTER — Ambulatory Visit (INDEPENDENT_AMBULATORY_CARE_PROVIDER_SITE_OTHER): Payer: Medicare HMO | Admitting: Family Medicine

## 2013-07-04 ENCOUNTER — Ambulatory Visit (INDEPENDENT_AMBULATORY_CARE_PROVIDER_SITE_OTHER): Payer: Medicare HMO

## 2013-07-04 ENCOUNTER — Encounter: Payer: Self-pay | Admitting: Family Medicine

## 2013-07-04 VITALS — BP 142/42 | HR 54 | Temp 98.2°F | Resp 21 | Ht 66.5 in | Wt 181.4 lb

## 2013-07-04 DIAGNOSIS — M545 Low back pain, unspecified: Secondary | ICD-10-CM

## 2013-07-04 DIAGNOSIS — G8929 Other chronic pain: Secondary | ICD-10-CM

## 2013-07-04 DIAGNOSIS — R1013 Epigastric pain: Secondary | ICD-10-CM

## 2013-07-04 MED ORDER — PREDNISONE 20 MG PO TABS: 20.0000 mg | ORAL_TABLET | Freq: Every day | ORAL | Status: DC

## 2013-07-04 NOTE — Patient Instructions (Signed)
Pick up a pro-biotic at the drug store (for example, Align) and take for 3 weeks  Pick up miralax powder and take every other day as needed for constipation

## 2013-07-04 NOTE — Progress Notes (Addendum)
The chart was scribed for Keith Haber, MD, by Neta Ehlers, ED Scribe. This patient's care was started at 1:12 PM.   Patient ID: Keith Ellis MRN: [id number], DOB: March 23, 1929, 80 y.o. Date of Encounter: 07/04/2013, 1:12 PM  Primary Physician: Keith Haber, MD  Chief Complaint:  Chief Complaint  Patient presents with   Back Pain    x 2 months     HPI: 80 y.o. year old male with history below presents with back pain which has occurred intermittently for approximately three months. The back pain is worse in the morning. He denies chest pain or pain radiating down his legs. 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 He also questions if the back pain is associated with Eliquis; the Eliquis ran out two days ago and the pt has not noticed improvement to his back pain. He denies daily uses of aspirin. He also reports increased flatulence and mildly loose stools since staritng a stool softener. The stool softener has improved the constipation he was experiencing.   The pt uses the [facility] for treatment as well.   Past Medical History  Diagnosis Date   GERD (gastroesophageal reflux disease)    Hypertension    Blind     Right enucleation   Hearing loss    Coronary artery disease     Minimal LAD stenosis 2007   BPH (benign prostatic hypertrophy)    Hyperlipidemia    Varicose vein    Peripheral neuropathy    Tremor, essential    Aortic sclerosis     2008   Aortic stenosis     Mild, echo, January, 2014   Atrial flutter     December, 2013   Chronic anticoagulation     Apixaban started December 27, 2011   Edema     December, 2013   CHF (congestive heart failure)    Hearing difficulty of both ears     hearing aids   Urine retention 01/08/2013   Depression    Urinary catheter in place      Home Meds: Prior to Admission medications   Medication Sig Start Date End Date Taking? Authorizing Provider    apixaban (ELIQUIS) 5 MG TABS tablet Take 5 mg by mouth 2 (two) times daily.   Yes Historical Provider, MD  Coconut Oil 1000 MG CAPS Take 1,000 mg by mouth 2 (two) times daily.   Yes Historical Provider, MD  DULoxetine (CYMBALTA) 60 MG capsule Take 60 mg by mouth every morning.   Yes Historical Provider, MD  ELDERBERRY PO Take 1 capsule by mouth daily.   Yes Historical Provider, MD  finasteride (PROSCAR) 5 MG tablet Take 1 tablet by mouth daily. 03/12/13  Yes Historical Provider, MD  lisinopril (PRINIVIL,ZESTRIL) 10 MG tablet Take 1 tablet (10 mg total) by mouth daily. 06/26/13  Yes Minus Breeding, MD  metolazone (ZAROXOLYN) 2.5 MG tablet Take 2.5 mg by mouth every Saturday.    Yes Historical Provider, MD  Multiple Vitamin (MULTIVITAMIN WITH MINERALS) TABS tablet Take 1 tablet by mouth daily.   Yes Historical Provider, MD  omeprazole (PRILOSEC) 20 MG capsule Take 20 mg by mouth 2 (two) times daily before a meal.    Yes Historical Provider, MD  Podiatric Products (GOLD BOND FOOT) CREA Apply 1 application topically daily as needed (for itching).   Yes Historical Provider, MD  potassium chloride (MICRO-K) 10 MEQ CR capsule Take 20 mEq by mouth 2 (two) times daily.  Yes Historical Provider, MD  senna-docusate (SENOKOT-S) 8.6-50 MG per tablet Take 1 tablet by mouth 2 (two) times daily as needed for mild constipation.   Yes Historical Provider, MD  torsemide (DEMADEX) 20 MG tablet TAKE ONE TABLET BY MOUTH TWICE DAILY 06/25/13  Yes Keith Haber, MD  traMADol (ULTRAM) 50 MG tablet Take 1 tablet by mouth as needed. 03/12/13  Yes Historical Provider, MD  zinc gluconate 50 MG tablet Take 50 mg by mouth daily.   Yes Historical Provider, MD    Allergies:  Allergies  Allergen Reactions   Menthol Hives    History   Social History   Marital Status: Married    Spouse Name: Loran Senters"    Number of Children: 3   Years of Education: 8th grade   Occupational History   [employer]     Retired    Social History Main Topics   Smoking status: Former Smoker    Types: Cigarettes    Quit date: 07/20/1975   Smokeless tobacco: Never Used   Alcohol Use: No   Drug Use: No   Sexual Activity: Not on file   Other Topics Concern   Not on file   Social History Narrative   Patient has hostile neighbor with multiple police reports.  Patient is happily married, but son is at home and is not happy which creates anxious and frustrating relationships.    Caffeine Use: 1 cup daily     Review of Systems:  Constitutional: negative for chills, fever, night sweats, weight changes, or fatigue  HEENT: negative for vision changes, hearing loss, congestion, rhinorrhea, ST, epistaxis, or sinus pressure Cardiovascular: negative for chest pain or palpitations Respiratory: negative for hemoptysis, wheezing, shortness of breath, or cough Abdominal: positive for flatulence; negative for abdominal pain, nausea, vomiting, diarrhea, or constipation Msk: Positive for bilateral back pain, negative for radiation of pain to legs Dermatological: negative for rash Neurologic: negative for headache, dizziness, or syncope All other systems reviewed and are otherwise negative with the exception to those above and in the HPI.   Physical Exam: Triage Vitals: Blood pressure 142/42, pulse 54, temperature 98.2 F (36.8 C), temperature source Oral, resp. rate 21, height 5' 6.5" (1.689 m), weight 181 lb 6 oz (82.271 kg), SpO2 97.00%., Body mass index is 28.84 kg/(m^2).  General: Well developed, well nourished, in no acute distress. Head: Normocephalic, atraumatic, eyes without discharge, sclera non-icteric, nares are without discharge. Bilateral auditory canals clear, TM's are without perforation, pearly grey and translucent with reflective cone of light bilaterally. Oral cavity moist, posterior pharynx without exudate, erythema, peritonsillar abscess, or post nasal drip.  Neck: Supple. No thyromegaly. Full ROM. No  lymphadenopathy. Lungs: Clear bilaterally to auscultation without wheezes, rales, or rhonchi. Breathing is unlabored. Heart: RRR with S1 S2. No murmurs, rubs, or gallops appreciated. Abdomen: Soft, non-tender, non-distended with normoactive bowel sounds. No hepatomegaly. No rebound/guarding. No obvious abdominal masses. Msk:  Strength and tone normal for age. Extremities/Skin: Warm and dry. No clubbing or cyanosis. No edema. No rashes or suspicious lesions.  Negative SLR Neuro: Alert and oriented X 3. Moves all extremities spontaneously. Gait is normal. CNII-XII grossly in tact.  nontender back Psych:  Responds to questions appropriately with a normal affect.   [facility] reading (PRIMARY) by  Dr. Joseph Art:  nonsp gas, no significant bony abn, arthritis changes consistent with age.   ASSESSMENT AND PLAN:  80 y.o. year old male with Low back pain without sciatica, unspecified back pain laterality - Plan: DG  Lumbar Spine 2-3 Views, predniSONE (DELTASONE) 20 MG tablet  Abdominal pain, chronic, epigastric     Signed, Keith Haber, MD 07/04/2013 1:12 PM

## 2013-07-06 ENCOUNTER — Encounter (HOSPITAL_COMMUNITY): Payer: Self-pay | Admitting: Emergency Medicine

## 2013-07-06 ENCOUNTER — Emergency Department (HOSPITAL_COMMUNITY): Payer: Medicare HMO

## 2013-07-06 ENCOUNTER — Inpatient Hospital Stay (HOSPITAL_COMMUNITY)
Admission: EM | Admit: 2013-07-06 | Discharge: 2013-07-09 | DRG: 312 | Disposition: A | Discharge status: Home Health | Payer: Medicare HMO | Attending: Internal Medicine | Admitting: Internal Medicine

## 2013-07-06 DIAGNOSIS — I4892 Unspecified atrial flutter: Secondary | ICD-10-CM | POA: Diagnosis present

## 2013-07-06 DIAGNOSIS — E861 Hypovolemia: Secondary | ICD-10-CM | POA: Diagnosis present

## 2013-07-06 DIAGNOSIS — E876 Hypokalemia: Secondary | ICD-10-CM | POA: Diagnosis present

## 2013-07-06 DIAGNOSIS — R609 Edema, unspecified: Secondary | ICD-10-CM

## 2013-07-06 DIAGNOSIS — G250 Essential tremor: Secondary | ICD-10-CM | POA: Diagnosis present

## 2013-07-06 DIAGNOSIS — R1319 Other dysphagia: Secondary | ICD-10-CM

## 2013-07-06 DIAGNOSIS — Z7901 Long term (current) use of anticoagulants: Secondary | ICD-10-CM

## 2013-07-06 DIAGNOSIS — E785 Hyperlipidemia, unspecified: Secondary | ICD-10-CM | POA: Diagnosis present

## 2013-07-06 DIAGNOSIS — I10 Essential (primary) hypertension: Secondary | ICD-10-CM | POA: Diagnosis present

## 2013-07-06 DIAGNOSIS — I2510 Atherosclerotic heart disease of native coronary artery without angina pectoris: Secondary | ICD-10-CM | POA: Diagnosis present

## 2013-07-06 DIAGNOSIS — E860 Dehydration: Secondary | ICD-10-CM | POA: Diagnosis present

## 2013-07-06 DIAGNOSIS — Z9001 Acquired absence of eye: Secondary | ICD-10-CM

## 2013-07-06 DIAGNOSIS — F329 Major depressive disorder, single episode, unspecified: Secondary | ICD-10-CM | POA: Diagnosis present

## 2013-07-06 DIAGNOSIS — Z800 Family history of malignant neoplasm of digestive organs: Secondary | ICD-10-CM

## 2013-07-06 DIAGNOSIS — Z9079 Acquired absence of other genital organ(s): Secondary | ICD-10-CM

## 2013-07-06 DIAGNOSIS — Z87891 Personal history of nicotine dependence: Secondary | ICD-10-CM

## 2013-07-06 DIAGNOSIS — R338 Other retention of urine: Secondary | ICD-10-CM

## 2013-07-06 DIAGNOSIS — M549 Dorsalgia, unspecified: Secondary | ICD-10-CM | POA: Diagnosis present

## 2013-07-06 DIAGNOSIS — H9190 Unspecified hearing loss, unspecified ear: Secondary | ICD-10-CM | POA: Diagnosis present

## 2013-07-06 DIAGNOSIS — H5440 Blindness, one eye, unspecified eye: Secondary | ICD-10-CM

## 2013-07-06 DIAGNOSIS — I4891 Unspecified atrial fibrillation: Secondary | ICD-10-CM | POA: Diagnosis present

## 2013-07-06 DIAGNOSIS — N401 Enlarged prostate with lower urinary tract symptoms: Secondary | ICD-10-CM | POA: Diagnosis present

## 2013-07-06 DIAGNOSIS — I359 Nonrheumatic aortic valve disorder, unspecified: Secondary | ICD-10-CM | POA: Diagnosis present

## 2013-07-06 DIAGNOSIS — I951 Orthostatic hypotension: Principal | ICD-10-CM | POA: Diagnosis present

## 2013-07-06 DIAGNOSIS — N390 Urinary tract infection, site not specified: Secondary | ICD-10-CM | POA: Diagnosis present

## 2013-07-06 DIAGNOSIS — N400 Enlarged prostate without lower urinary tract symptoms: Secondary | ICD-10-CM

## 2013-07-06 DIAGNOSIS — R339 Retention of urine, unspecified: Secondary | ICD-10-CM

## 2013-07-06 DIAGNOSIS — E669 Obesity, unspecified: Secondary | ICD-10-CM

## 2013-07-06 DIAGNOSIS — N179 Acute kidney failure, unspecified: Secondary | ICD-10-CM | POA: Diagnosis present

## 2013-07-06 DIAGNOSIS — G609 Hereditary and idiopathic neuropathy, unspecified: Secondary | ICD-10-CM | POA: Diagnosis present

## 2013-07-06 DIAGNOSIS — Z9889 Other specified postprocedural states: Secondary | ICD-10-CM

## 2013-07-06 DIAGNOSIS — K219 Gastro-esophageal reflux disease without esophagitis: Secondary | ICD-10-CM | POA: Diagnosis present

## 2013-07-06 DIAGNOSIS — Z91018 Allergy to other foods: Secondary | ICD-10-CM

## 2013-07-06 DIAGNOSIS — G629 Polyneuropathy, unspecified: Secondary | ICD-10-CM

## 2013-07-06 DIAGNOSIS — G252 Other specified forms of tremor: Secondary | ICD-10-CM

## 2013-07-06 DIAGNOSIS — R55 Syncope and collapse: Secondary | ICD-10-CM

## 2013-07-06 DIAGNOSIS — F3289 Other specified depressive episodes: Secondary | ICD-10-CM | POA: Diagnosis present

## 2013-07-06 DIAGNOSIS — I350 Nonrheumatic aortic (valve) stenosis: Secondary | ICD-10-CM | POA: Diagnosis present

## 2013-07-06 HISTORY — DX: Syncope and collapse: R55

## 2013-07-06 LAB — CBC WITH DIFFERENTIAL/PLATELET
Basophils Absolute: 0 10*3/uL (ref 0.0–0.1)
Basophils Relative: 0 % (ref 0–1)
Eosinophils Absolute: 0.1 10*3/uL (ref 0.0–0.7)
Eosinophils Relative: 1 % (ref 0–5)
HCT: 49 % (ref 39.0–52.0)
HEMOGLOBIN: 16.9 g/dL (ref 13.0–17.0)
Lymphocytes Relative: 27 % (ref 12–46)
Lymphs Abs: 2.5 10*3/uL (ref 0.7–4.0)
MCH: 30.2 pg (ref 26.0–34.0)
MCHC: 34.5 g/dL (ref 30.0–36.0)
MCV: 87.7 fL (ref 78.0–100.0)
MONOS PCT: 8 % (ref 3–12)
Monocytes Absolute: 0.7 10*3/uL (ref 0.1–1.0)
NEUTROS ABS: 5.8 10*3/uL (ref 1.7–7.7)
Neutrophils Relative %: 64 % (ref 43–77)
Platelets: 177 10*3/uL (ref 150–400)
RBC: 5.59 MIL/uL (ref 4.22–5.81)
RDW: 13.5 % (ref 11.5–15.5)
WBC: 9.1 10*3/uL (ref 4.0–10.5)

## 2013-07-06 LAB — URINALYSIS, ROUTINE W REFLEX MICROSCOPIC
Glucose, UA: NEGATIVE mg/dL
HGB URINE DIPSTICK: NEGATIVE
Ketones, ur: 15 mg/dL — AB
NITRITE: NEGATIVE
Protein, ur: 30 mg/dL — AB
SPECIFIC GRAVITY, URINE: 1.017 (ref 1.005–1.030)
Urobilinogen, UA: 1 mg/dL (ref 0.0–1.0)
pH: 5 (ref 5.0–8.0)

## 2013-07-06 LAB — BASIC METABOLIC PANEL
BUN: 64 mg/dL — ABNORMAL HIGH (ref 6–23)
CO2: 24 mEq/L (ref 19–32)
Calcium: 9.2 mg/dL (ref 8.4–10.5)
Chloride: 93 mEq/L — ABNORMAL LOW (ref 96–112)
Creatinine, Ser: 2.28 mg/dL — ABNORMAL HIGH (ref 0.50–1.35)
GFR calc non Af Amer: 25 mL/min — ABNORMAL LOW (ref >90)
GFR, EST AFRICAN AMERICAN: 29 mL/min — AB (ref >90)
Glucose, Bld: 108 mg/dL — ABNORMAL HIGH (ref 70–99)
POTASSIUM: 4.3 meq/L (ref 3.7–5.3)
SODIUM: 136 meq/L — AB (ref 137–147)

## 2013-07-06 LAB — URINE MICROSCOPIC-ADD ON

## 2013-07-06 LAB — TROPONIN I: Troponin I: <0.3 ng/mL (ref <0.30)

## 2013-07-06 MED ORDER — SODIUM CHLORIDE 0.9 % IJ SOLN
3.0000 mL | Freq: Two times a day (BID) | INTRAMUSCULAR | Status: DC
  Administered 2013-07-06 – 2013-07-09 (×6): 3 mL via INTRAVENOUS

## 2013-07-06 MED ORDER — ONDANSETRON HCL 4 MG/2ML IJ SOLN: 4.0000 mg | Freq: Four times a day (QID) | INTRAMUSCULAR | Status: DC | PRN

## 2013-07-06 MED ORDER — SODIUM CHLORIDE 0.9 % IV SOLN
Freq: Once | INTRAVENOUS | Status: AC
  Administered 2013-07-06: 21:00:00 via INTRAVENOUS

## 2013-07-06 MED ORDER — APIXABAN 2.5 MG PO TABS
2.5000 mg | ORAL_TABLET | Freq: Two times a day (BID) | ORAL | Status: DC
  Administered 2013-07-06 – 2013-07-07 (×3): 2.5 mg via ORAL
  Filled 2013-07-06 (×6): qty 1

## 2013-07-06 MED ORDER — ACETAMINOPHEN 650 MG RE SUPP: 650.0000 mg | Freq: Four times a day (QID) | RECTAL | Status: DC | PRN

## 2013-07-06 MED ORDER — PREDNISONE 20 MG PO TABS
20.0000 mg | ORAL_TABLET | Freq: Every day | ORAL | Status: DC
  Administered 2013-07-07 – 2013-07-09 (×3): 20 mg via ORAL
  Filled 2013-07-06 (×5): qty 1

## 2013-07-06 MED ORDER — ONDANSETRON HCL 4 MG PO TABS: 4.0000 mg | ORAL_TABLET | Freq: Four times a day (QID) | ORAL | Status: DC | PRN

## 2013-07-06 MED ORDER — SODIUM CHLORIDE 0.9 % IV SOLN
INTRAVENOUS | Status: DC
  Administered 2013-07-06: 17:00:00 via INTRAVENOUS

## 2013-07-06 MED ORDER — ACETAMINOPHEN 325 MG PO TABS: 650.0000 mg | ORAL_TABLET | Freq: Four times a day (QID) | ORAL | Status: DC | PRN

## 2013-07-06 NOTE — ED Notes (Signed)
Patient transported to CT 

## 2013-07-06 NOTE — ED Notes (Signed)
Pt arrives via EMS from home with 2 episodes of near syncope since yesterday. Pt reportedly dizzy while at church yesterday while in the bathroom. Today was walking in the drug store, was lightheaded, dizzy, diaphoretic. 20g LFA. [facility] EMS. Pt awake, alert, oriented x4. Pt orthostatic in the field.

## 2013-07-06 NOTE — Progress Notes (Signed)
ANTICOAGULATION CONSULT NOTE - Initial Consult  Pharmacy Consult for apixaban Indication: atrial fibrillation  Allergies  Allergen Reactions  . Menthol Hives    Patient Measurements: Weight: 200 lb (90.719 kg)   Vital Signs: BP: 143/50 mmHg (06/29 2000) Pulse Rate: 54 (06/29 2000)  Labs:  Recent Labs  07/06/13 1525 07/06/13 1625  HGB  --  16.9  HCT  --  49.0  PLT  --  177  CREATININE  --  2.28*  TROPONINI <0.30  --     The CrCl is unknown because both a height and weight (above a minimum accepted value) are required for this calculation.   Medical History: Past Medical History  Diagnosis Date  . GERD (gastroesophageal reflux disease)   . Hypertension   . Blind     Right enucleation  . Hearing loss   . Coronary artery disease     Minimal LAD stenosis 2007  . BPH (benign prostatic hypertrophy)   . Hyperlipidemia   . Varicose vein   . Peripheral neuropathy   . Tremor, essential   . Aortic sclerosis     2008  . Aortic stenosis     Mild, echo, January, 2014  . Atrial flutter     December, 2013  . Chronic anticoagulation     Apixaban started December 27, 2011  . Edema     December, 2013  . CHF (congestive heart failure)   . Hearing difficulty of both ears     hearing aids  . Urine retention 01/08/2013  . Depression   . Urinary catheter in place     Medications:  Apixaban 5mg  bid pta  Assessment: 80 year old male admitted for multiple syncopal episodes over the past 24 hours. Patient has history of afib on apixaban (full dose). Patient appears at least somewhat dehydrated with scr elevated at 2.28 previous results last week and last month were normal. CBC appears normal and no bleeding has been noted.   Given ARF patient qualifies for reduced dosing with scr>1.5 and age>80. Pharmacy to follow and if renal function returns back to baseline he could go back on his previous home dose of 5mg  bid.  Goal of Therapy:  Monitor platelets by anticoagulation  protocol: Yes   Plan:  Apixaban 2.5mg  bid Follow up renal function and adjust dosing as necessary  Erin Hearing PharmD., BCPS Clinical Pharmacist Pager [phone] 07/06/2013 9:29 PM

## 2013-07-06 NOTE — ED Provider Notes (Signed)
Patient was signed out to me by Doctor Thurnell Garbe to followup on the patient's workup. He was seen for evaluation of 2 separate syncopal episodes over the past 24 hours. Workup has revealed evidence of dehydration with orthostatic hypotension, likely the cause of the syncope. He has acute kidney injury. There is also urinary tract infection. Patient will require hospitalization for further management of these problems. Discussed with Doctor Posey Pronto, will be placed in observation on telemetry.  Orpah Greek, MD 07/06/13 [phone]

## 2013-07-06 NOTE — ED Notes (Signed)
Return from ct pt alert x4

## 2013-07-06 NOTE — ED Provider Notes (Signed)
CSN: [id number]     Arrival date & time 07/06/13  1456 History   First MD Initiated Contact with Patient 07/06/13 1457     Chief Complaint  Patient presents with  . Loss of Consciousness      HPI Pt was seen at 1500. Per pt and his wife, c/o sudden onset and resolution of 2 episodes of syncope yesterday and today. Pt states he felt "lightheaded" before the syncopal episodes. Pt states the episode yesterday was "while I was at church" and the episode today was while he was walking into a store. EMS states pt was orthostatic on scene. Pt denies CP/palpitations, no SOB/cough, no abd pain, no N/V/D, no seizure activity, no incont of bowel/bladder, no focal motor weakness, no tingling/numbness in extremities.    Past Medical History  Diagnosis Date  . GERD (gastroesophageal reflux disease)   . Hypertension   . Blind     Right enucleation  . Hearing loss   . Coronary artery disease     Minimal LAD stenosis 2007  . BPH (benign prostatic hypertrophy)   . Hyperlipidemia   . Varicose vein   . Peripheral neuropathy   . Tremor, essential   . Aortic sclerosis     2008  . Aortic stenosis     Mild, echo, January, 2014  . Atrial flutter     December, 2013  . Chronic anticoagulation     Apixaban started December 27, 2011  . Edema     December, 2013  . CHF (congestive heart failure)   . Hearing difficulty of both ears     hearing aids  . Urine retention 01/08/2013  . Depression   . Urinary catheter in place    Past Surgical History  Procedure Laterality Date  . Lung surgery      Benign lump removed  . Tumor excision      behindleft eye,abpve left eye,left foot  . Esophagogastroduodenoscopy N/A 06/23/2012    Procedure: ESOPHAGOGASTRODUODENOSCOPY (EGD);  Surgeon: Ladene Artist, MD;  Location: [facility];  Service: Endoscopy;  Laterality: N/A;  . Upper gastrointestinal endoscopy    . Cataract extraction Right   . Eye surgery Left     removal eyeball  . Transurethral resection of  prostate N/A 03/11/2013    Procedure: TRANSURETHRAL RESECTION OF THE PROSTATE WITH GYRUS INSTRUMENTS;  Surgeon: Alexis Frock, MD;  Location: [facility];  Service: Urology;  Laterality: N/A;   Family History  Problem Relation Age of Onset  . Cancer Father     Rectal  . Rectal cancer Father   . Cancer Mother     Breast  . Stomach cancer Neg Hx   . Esophageal cancer Neg Hx   . Colon cancer Neg Hx   . Cancer Sister    History  Substance Use Topics  . Smoking status: Former Smoker    Types: Cigarettes    Quit date: 07/20/1975  . Smokeless tobacco: Never Used  . Alcohol Use: No    Review of Systems ROS: Statement: All systems negative except as marked or noted in the HPI; Constitutional: Negative for fever and chills. ; ; Eyes: Negative for eye pain, redness and discharge. ; ; ENMT: Negative for ear pain, hoarseness, nasal congestion, sinus pressure and sore throat. ; ; Cardiovascular: Negative for chest pain, palpitations, diaphoresis, dyspnea and peripheral edema. ; ; Respiratory: Negative for cough, wheezing and stridor. ; ; Gastrointestinal: Negative for nausea, vomiting, diarrhea, abdominal pain, blood in stool, hematemesis, jaundice and rectal  bleeding. . ; ; Genitourinary: Negative for dysuria, flank pain and hematuria. ; ; Musculoskeletal: Negative for back pain and neck pain. Negative for swelling and trauma.; ; Skin: Negative for pruritus, rash, abrasions, blisters, bruising and skin lesion.; ; Neuro: Negative for headache and neck stiffness. Negative for weakness, altered level of consciousness , altered mental status, extremity weakness, paresthesias, involuntary movement, seizure and +lightheadedness, +syncope.        Allergies  Menthol  Home Medications   Prior to Admission medications   Medication Sig Start Date End Date Taking? Authorizing Provider  apixaban (ELIQUIS) 5 MG TABS tablet Take 5 mg by mouth 2 (two) times daily.   Yes Historical Provider, MD  Coconut Oil 1000  MG CAPS Take 1,000 mg by mouth 2 (two) times daily.   Yes Historical Provider, MD  [facility] Liver Oil CAPS Take 1 capsule by mouth daily.   Yes Historical Provider, MD  Cyanocobalamin (B-12 PO) Take 1 capsule by mouth daily.   Yes Historical Provider, MD  ELDERBERRY PO Take 1 capsule by mouth daily.   Yes Historical Provider, MD  lisinopril (PRINIVIL,ZESTRIL) 10 MG tablet Take 1 tablet (10 mg total) by mouth daily. 06/26/13  Yes Minus Breeding, MD  Multiple Vitamin (MULTIVITAMIN WITH MINERALS) TABS tablet Take 1 tablet by mouth daily.   Yes Historical Provider, MD  predniSONE (DELTASONE) 20 MG tablet Take 20 mg by mouth daily with breakfast. For 7 days. Began 07/04/13. Last dose will be 07/11/13   Yes Historical Provider, MD  torsemide (DEMADEX) 20 MG tablet Take 20 mg by mouth 2 (two) times daily.   Yes Historical Provider, MD   BP 120/36  Pulse 56  Resp 18  Wt 200 lb (90.719 kg)  SpO2 98% Physical Exam 1505: Physical examination:  Nursing notes reviewed; Vital signs and O2 SAT reviewed;  Constitutional: Well developed, Well nourished, Well hydrated, In no acute distress; Head:  Normocephalic, atraumatic; Eyes: EOMI, PERRL, No scleral icterus; ENMT: Mouth and pharynx normal, Mucous membranes moist; Neck: Supple, Full range of motion, No lymphadenopathy; Cardiovascular: Regular rate and rhythm, No gallop; Respiratory: Breath sounds clear & equal bilaterally, No wheezes.  Speaking full sentences with ease, Normal respiratory effort/excursion; Chest: Nontender, Movement normal; Abdomen: Soft, Nontender, Nondistended, Normal bowel sounds; Genitourinary: No CVA tenderness; Extremities: Pulses normal, No tenderness, No edema, No calf edema or asymmetry.; Neuro: AA&Ox3, +HOH, otherwise major CN grossly intact. Speech clear.  No facial droop.  No nystagmus. Grips equal. Strength 5/5 equal bilat UE's and LE's.  DTR 2/4 equal bilat UE's and LE's.  No gross sensory deficits.  Normal cerebellar testing bilat UE's  (finger-nose) and LE's (heel-shin).; Skin: Color normal, Warm, Dry.    ED Course  Procedures    EKG Interpretation   Date/Time:  Monday July 06 2013 16:15:39 EDT Ventricular Rate:  54 PR Interval:  329 QRS Duration: 151 QT Interval:  497 QTC Calculation: 471 R Axis:   73 Text Interpretation:  Sinus rhythm with first degree AV block Right bundle  branch block When compared with ECG of 12/31/2010 No significant change  was found Confirmed by [facility]  MD, Nunzio Cory [phone]) on 07/06/2013 4:24:50  PM      MDM  MDM Reviewed: previous chart, nursing note and vitals Reviewed previous: labs and ECG Interpretation: labs, ECG, x-ray and CT scan    Results for orders placed during the hospital encounter of 07/06/13  CBC WITH DIFFERENTIAL      Result Value Ref Range   WBC  9.1  4.0 - 10.5 K/uL   RBC 5.59  4.22 - 5.81 MIL/uL   Hemoglobin 16.9  13.0 - 17.0 g/dL   HCT 49.0  39.0 - 52.0 %   MCV 87.7  78.0 - 100.0 fL   MCH 30.2  26.0 - 34.0 pg   MCHC 34.5  30.0 - 36.0 g/dL   RDW 13.5  11.5 - 15.5 %   Platelets 177  150 - 400 K/uL   Neutrophils Relative % 64  43 - 77 %   Neutro Abs 5.8  1.7 - 7.7 K/uL   Lymphocytes Relative 27  12 - 46 %   Lymphs Abs 2.5  0.7 - 4.0 K/uL   Monocytes Relative 8  3 - 12 %   Monocytes Absolute 0.7  0.1 - 1.0 K/uL   Eosinophils Relative 1  0 - 5 %   Eosinophils Absolute 0.1  0.0 - 0.7 K/uL   Basophils Relative 0  0 - 1 %   Basophils Absolute 0.0  0.0 - 0.1 K/uL   Dg Chest 2 View 07/06/2013   CLINICAL DATA:  Two syncopal episodes in past 24 hr. Current history of aortic stenosis and coronary artery disease. Long-time smoker.  EXAM: CHEST  2 VIEW  COMPARISON:  01/08/2013, 12/31/2010.  FINDINGS: Cardiac silhouette upper normal in size, unchanged. Thoracic aorta mildly atherosclerotic, unchanged. Hilar and mediastinal contours otherwise unremarkable. Lungs clear. Bronchovascular markings normal. Pulmonary vascularity normal. No visible pleural effusions. No  pneumothorax. Degenerative changes involving the thoracic spine. No significant interval change.  IMPRESSION: No acute cardiopulmonary disease.  Stable examination.   Electronically Signed   By: Evangeline Dakin M.D.   On: 07/06/2013 16:12   Ct Head Wo Contrast 07/06/2013   CLINICAL DATA:  Near syncope  EXAM: CT HEAD WITHOUT CONTRAST  TECHNIQUE: Contiguous axial images were obtained from the base of the skull through the vertex without intravenous contrast.  COMPARISON:  None.  FINDINGS: There is no evidence of mass effect, midline shift or extra-axial fluid collections. There is no evidence of a space-occupying lesion or intracranial hemorrhage. There is no evidence of a cortical-based area of acute infarction. There is generalized mild cerebral atrophy.  The ventricles and sulci are appropriate for the patient's age. The basal cisterns are patent.  Incidental note is made of a left eye prosthesis. The visualized portions of the paranasal sinuses and mastoid air cells are unremarkable.  The osseous structures are unremarkable.  IMPRESSION: No acute intracranial pathology.   Electronically Signed   By: Kathreen Devoid   On: 07/06/2013 16:58    1715:  Pt orthostatic on VS; judicious IVF given. Neuro exam unchanged. Labs pending. Will need admit. Sign out to Dr. Betsey Holiday.   Alfonzo Feller, DO 07/06/13 1722

## 2013-07-06 NOTE — H&P (Signed)
[facility] History and Physical  Patient: Keith Ellis  IEP:[id number]  DOB: 1930/01/09  DOS: the patient was seen and examined on 07/06/2013 PCP: [name], MD  Chief Complaint: Syncope  HPI: Keith Ellis is a 80 y.o. male with Past medical history of atrial fibrillation, aortic stenosis, peripheral neuropathy, hypertension, GERD, left eye enucleation. Patient presented with complaints of syncope. He mentions yesterday when he woke up in the morning he was not feeling good and was feeling dizzy when he was changing his position to standing. He went to church and came back and when he was in the restroom he initially felt dizzy and found himself on the floor. Denies any head injury or neck injury. No focal deficit no incontinence at that point. He was able to stand up on his own and felt occasional dizziness during the day. This morning when he went to the drugstore with his son when he came out of the car and was walking to the drugstore again felt dizzy and nearly passed out. No injury to head or neck again. At the time of my evaluation patient denies any complaint of dizziness, vertigo, focal deficit, vision changes, chest pain, palpitation, shortness of breath, cough, nausea, vomiting, abdominal pain, diarrhea, constipation. He recently has undergone TURP 2 months ago. Denies any burning and continues to have increasing urinary frequency. Recently he was placed on lisinopril 2.5 mg daily for uncontrolled blood pressure. 2 days ago he was placed on prednisone for continuous back pain.  The patient is coming from home And at his baseline independent for most of his ADL.  Review of Systems: as mentioned in the history of present illness.  A Comprehensive review of the other systems is negative.  Past Medical History  Diagnosis Date  . GERD (gastroesophageal reflux disease)   . Hypertension   . Blind     Right enucleation  . Hearing loss   . Coronary artery disease    Minimal LAD stenosis 2007  . BPH (benign prostatic hypertrophy)   . Hyperlipidemia   . Varicose vein   . Peripheral neuropathy   . Tremor, essential   . Aortic sclerosis     2008  . Aortic stenosis     Mild, echo, January, 2014  . Atrial flutter     December, 2013  . Chronic anticoagulation     Apixaban started December 27, 2011  . Edema     December, 2013  . CHF (congestive heart failure)   . Hearing difficulty of both ears     hearing aids  . Urine retention 01/08/2013  . Depression   . Urinary catheter in place    Past Surgical History  Procedure Laterality Date  . Lung surgery      Benign lump removed  . Tumor excision      behindleft eye,abpve left eye,left foot  . Esophagogastroduodenoscopy N/A 06/23/2012    Procedure: ESOPHAGOGASTRODUODENOSCOPY (EGD);  Surgeon: Ladene Artist, MD;  Location: [facility];  Service: Endoscopy;  Laterality: N/A;  . Upper gastrointestinal endoscopy    . Cataract extraction Right   . Eye surgery Left     removal eyeball  . Transurethral resection of prostate N/A 03/11/2013    Procedure: TRANSURETHRAL RESECTION OF THE PROSTATE WITH GYRUS INSTRUMENTS;  Surgeon: Alexis Frock, MD;  Location: [facility];  Service: Urology;  Laterality: N/A;   Social History:  reports that he quit smoking about 37 years ago. His smoking use included Cigarettes. He smoked 0.00 packs per  day. He has never used smokeless tobacco. He reports that he does not drink alcohol or use illicit drugs.  Allergies  Allergen Reactions  . Menthol Hives    Family History  Problem Relation Age of Onset  . Cancer Father     Rectal  . Rectal cancer Father   . Cancer Mother     Breast  . Stomach cancer Neg Hx   . Esophageal cancer Neg Hx   . Colon cancer Neg Hx   . Cancer Sister     Prior to Admission medications   Medication Sig Start Date End Date Taking? Authorizing Provider  apixaban (ELIQUIS) 5 MG TABS tablet Take 5 mg by mouth 2 (two) times daily.   Yes  Historical Provider, MD  Coconut Oil 1000 MG CAPS Take 1,000 mg by mouth 2 (two) times daily.   Yes Historical Provider, MD  [facility] Liver Oil CAPS Take 1 capsule by mouth daily.   Yes Historical Provider, MD  Cyanocobalamin (B-12 PO) Take 1 capsule by mouth daily.   Yes Historical Provider, MD  ELDERBERRY PO Take 1 capsule by mouth daily.   Yes Historical Provider, MD  lisinopril (PRINIVIL,ZESTRIL) 10 MG tablet Take 1 tablet (10 mg total) by mouth daily. 06/26/13  Yes Minus Breeding, MD  Multiple Vitamin (MULTIVITAMIN WITH MINERALS) TABS tablet Take 1 tablet by mouth daily.   Yes Historical Provider, MD  predniSONE (DELTASONE) 20 MG tablet Take 20 mg by mouth daily with breakfast. For 7 days. Began 07/04/13. Last dose will be 07/11/13   Yes Historical Provider, MD  torsemide (DEMADEX) 20 MG tablet Take 20 mg by mouth 2 (two) times daily.   Yes Historical Provider, MD    Physical Exam: Filed Vitals:   07/06/13 1830 07/06/13 1900 07/06/13 1930 07/06/13 2000  BP: 135/42 138/49 122/40 143/50  Pulse: 56 52 50 54  Resp: 23 18 20 11   Weight:      SpO2: 98% 98% 98% 98%    General: Alert, Awake and Oriented to Time, Place and Person. Appear in mild distress Eyes: PRRL ENT: Oral Mucosa clear moist. Neck: no JVD Cardiovascular: S1 and S2 Present, aortic systolic Murmur, Peripheral Pulses Present Respiratory: Bilateral Air entry equal and Decreased, Clear to Auscultation,  no Crackles,no wheezes Abdomen: Bowel Sound Present, Soft and Non tender Skin: no Rash Extremities: no Pedal edema, no calf tenderness Neurologic: Grossly no focal neuro deficit.  Labs on Admission:  CBC:  Recent Labs Lab 07/06/13 1625  WBC 9.1  NEUTROABS 5.8  HGB 16.9  HCT 49.0  MCV 87.7  PLT 177    CMP     Component Value Date/Time   NA 136* 07/06/2013 1625   K 4.3 07/06/2013 1625   CL 93* 07/06/2013 1625   CO2 24 07/06/2013 1625   GLUCOSE 108* 07/06/2013 1625   BUN 64* 07/06/2013 1625   CREATININE 2.28* 07/06/2013  1625   CREATININE 1.17 05/11/2013 1248   CALCIUM 9.2 07/06/2013 1625   PROT 6.9 05/11/2013 1248   ALBUMIN 4.0 05/11/2013 1248   AST 16 05/11/2013 1248   ALT 10 05/11/2013 1248   ALKPHOS 97 05/11/2013 1248   BILITOT 1.5* 05/11/2013 1248   GFRNONAA 25* 07/06/2013 1625   GFRAA 29* 07/06/2013 1625    No results found for this basename: LIPASE, AMYLASE,  in the last 168 hours No results found for this basename: AMMONIA,  in the last 168 hours   Recent Labs Lab 07/06/13 [place] <0.30  BNP (last 3 results) No results found for this basename: PROBNP,  in the last 8760 hours  Radiological Exams on Admission: Dg Chest 2 View  07/06/2013   CLINICAL DATA:  Two syncopal episodes in past 24 hr. Current history of aortic stenosis and coronary artery disease. Long-time smoker.  EXAM: CHEST  2 VIEW  COMPARISON:  01/08/2013, 12/31/2010.  FINDINGS: Cardiac silhouette upper normal in size, unchanged. Thoracic aorta mildly atherosclerotic, unchanged. Hilar and mediastinal contours otherwise unremarkable. Lungs clear. Bronchovascular markings normal. Pulmonary vascularity normal. No visible pleural effusions. No pneumothorax. Degenerative changes involving the thoracic spine. No significant interval change.  IMPRESSION: No acute cardiopulmonary disease.  Stable examination.   Electronically Signed   By: Evangeline Dakin M.D.   On: 07/06/2013 16:12   Ct Head Wo Contrast  07/06/2013   CLINICAL DATA:  Near syncope  EXAM: CT HEAD WITHOUT CONTRAST  TECHNIQUE: Contiguous axial images were obtained from the base of the skull through the vertex without intravenous contrast.  COMPARISON:  None.  FINDINGS: There is no evidence of mass effect, midline shift or extra-axial fluid collections. There is no evidence of a space-occupying lesion or intracranial hemorrhage. There is no evidence of a cortical-based area of acute infarction. There is generalized mild cerebral atrophy.  The ventricles and sulci are appropriate for the  patient's age. The basal cisterns are patent.  Incidental note is made of a left eye prosthesis. The visualized portions of the paranasal sinuses and mastoid air cells are unremarkable.  The osseous structures are unremarkable.  IMPRESSION: No acute intracranial pathology.   Electronically Signed   By: Kathreen Devoid   On: 07/06/2013 16:58    EKG: Independently reviewed. normal sinus rhythm, nonspecific ST and T waves changes.  Assessment/Plan Principal Problem:   Syncope Active Problems:   Hypertension   Coronary artery disease   Aortic stenosis   Atrial flutter   AKI (acute kidney injury)   BPH (benign prostatic hypertrophy) with urinary retention   S/P TURP (status post transurethral resection of prostate)   1. Syncope Patient presents with complaints of syncopal episodes x2. At the time of my evaluation he does not have any focal deficit. EKG is unremarkable for any arrhythmia. Initial lab work shows acute kidney injury and possible pyuria. He was also orthostatic as per ED physician blood pressure dropped from 710 [id number] systolic on standing from lying. Possible etiology of syncope could be orthostatic hypotension in elderly, aortic stenosis, atrial arrhythmia. At present patient is receiving IV hydration, I would hold his torsemide and lisinopril. Echocardiogram and telemetry monitoring and serial troponins. Bladder scan postvoid and recheck BMP in morning. If no improvement in his renal function that he may need ultrasound renal.  2. Hypertension At present has a stable holding lisinopril  3. Asymptomatic bacteremia Patient's urine shows pyuria but patient does not have any burning urination or fever or chills or leukocytosis. We will monitor the patient if he develops fever or leukocytosis then treat him with IV ceftriaxone.  4. Atrial flutter Continue Apixaban  DVT Prophylaxis: on chronic anticoagulation Nutrition:  cardiac diet  Code Status:  full   Disposition:  Admitted to observation in telemetry unit.  Author: Berle Mull, MD [facility] Pager: [phone] 07/06/2013, 9:08 PM    If 7PM-7AM, please contact night-coverage [email or web address] Password [email or web address]  **Disclaimer: This note may have been dictated with voice recognition software. Similar sounding words can inadvertently be transcribed and this note may contain transcription errors which may not have been  corrected upon publication of note.**

## 2013-07-07 ENCOUNTER — Encounter (HOSPITAL_COMMUNITY): Payer: Self-pay | Admitting: General Practice

## 2013-07-07 DIAGNOSIS — N390 Urinary tract infection, site not specified: Secondary | ICD-10-CM | POA: Diagnosis present

## 2013-07-07 DIAGNOSIS — I359 Nonrheumatic aortic valve disorder, unspecified: Secondary | ICD-10-CM

## 2013-07-07 LAB — URINE CULTURE: Colony Count: 25000

## 2013-07-07 LAB — CBC
HEMATOCRIT: 48.4 % (ref 39.0–52.0)
HEMOGLOBIN: 16.4 g/dL (ref 13.0–17.0)
MCH: 29.6 pg (ref 26.0–34.0)
MCHC: 33.9 g/dL (ref 30.0–36.0)
MCV: 87.4 fL (ref 78.0–100.0)
Platelets: 153 10*3/uL (ref 150–400)
RBC: 5.54 MIL/uL (ref 4.22–5.81)
RDW: 13.4 % (ref 11.5–15.5)
WBC: 7.8 10*3/uL (ref 4.0–10.5)

## 2013-07-07 LAB — COMPREHENSIVE METABOLIC PANEL
ALT: 19 U/L (ref 0–53)
AST: 24 U/L (ref 0–37)
Albumin: 3.7 g/dL (ref 3.5–5.2)
Alkaline Phosphatase: 81 U/L (ref 39–117)
BUN: 67 mg/dL — ABNORMAL HIGH (ref 6–23)
CO2: 27 mEq/L (ref 19–32)
CREATININE: 1.83 mg/dL — AB (ref 0.50–1.35)
Calcium: 9.1 mg/dL (ref 8.4–10.5)
Chloride: 96 mEq/L (ref 96–112)
GFR calc Af Amer: 38 mL/min — ABNORMAL LOW (ref >90)
GFR calc non Af Amer: 33 mL/min — ABNORMAL LOW (ref >90)
Glucose, Bld: 104 mg/dL — ABNORMAL HIGH (ref 70–99)
Potassium: 3.4 mEq/L — ABNORMAL LOW (ref 3.7–5.3)
SODIUM: 139 meq/L (ref 137–147)
TOTAL PROTEIN: 6.8 g/dL (ref 6.0–8.3)
Total Bilirubin: 1.6 mg/dL — ABNORMAL HIGH (ref 0.3–1.2)

## 2013-07-07 LAB — PROTIME-INR
INR: 1.21 (ref 0.00–1.49)
PROTHROMBIN TIME: 15.3 s — AB (ref 11.6–15.2)

## 2013-07-07 LAB — TROPONIN I

## 2013-07-07 MED ORDER — DIPHENHYDRAMINE HCL 12.5 MG/5ML PO ELIX: 12.5000 mg | ORAL_SOLUTION | Freq: Every evening | ORAL | Status: DC | PRN

## 2013-07-07 MED ORDER — DEXTROSE 5 % IV SOLN
1.0000 g | INTRAVENOUS | Status: DC
  Administered 2013-07-07: 1 g via INTRAVENOUS
  Filled 2013-07-07 (×2): qty 10

## 2013-07-07 MED ORDER — POTASSIUM CHLORIDE CRYS ER 20 MEQ PO TBCR
20.0000 meq | EXTENDED_RELEASE_TABLET | Freq: Once | ORAL | Status: AC
  Administered 2013-07-07: 20 meq via ORAL
  Filled 2013-07-07: qty 1

## 2013-07-07 MED ORDER — ZOLPIDEM TARTRATE 5 MG PO TABS
5.0000 mg | ORAL_TABLET | Freq: Every evening | ORAL | Status: DC | PRN
  Administered 2013-07-07 – 2013-07-09 (×2): 5 mg via ORAL
  Filled 2013-07-07 (×2): qty 1

## 2013-07-07 MED ORDER — SODIUM CHLORIDE 0.9 % IV SOLN
INTRAVENOUS | Status: DC
  Administered 2013-07-08 – 2013-07-09 (×2): via INTRAVENOUS

## 2013-07-07 NOTE — Progress Notes (Signed)
  Echocardiogram 2D Echocardiogram has been performed.  Diamond Nickel 07/07/2013, 12:11 PM

## 2013-07-07 NOTE — Progress Notes (Signed)
UR Completed.  Keith Ellis, Keith Ellis [phone] 07/07/2013  

## 2013-07-07 NOTE — Progress Notes (Signed)
[facility] PROGRESS NOTE  Keith Ellis HBZ:[id number] DOB: Sep 06, 1929 DOA: 07/06/2013 PCP: [name], MD  Assessment/Plan: 1. Syncope- likely secondary to orthostatic hypotension  Patient presented with complaints of syncopal episodes x2.  -orthostatic as per ED physician blood pressure dropped from 101 BP102 systolic on standing from lying.  -continue IVF for now, follow and recheck orthostatics -continue holding torsemide and lisinopril for now.  -Troponins neg, await Echocardiogram  2.AKI -Likely prerenal, continue hydration and holding torsemide for now as above -cr improving, follow and recheck 2. Hypertension  -holding lisinopril d/t #2, monitor and further treat accordingly 3. Probable UTI  -will start on empiric abx with rocephin pending culture results 4. Atrial flutter  Continue Apixaban  5. Hypokalemia -replace k DVT Prophylaxis: on Eliquis Nutrition: cardiac diet      Code Status: full Family Communication: wife and daughter at bedside Disposition Plan: to home when medically ready   Consultants:  none  Procedures:  echo  Antibiotics:  Rocephin started 6/30>>  HPI/Subjective: States still dizzy with standing, but better with than yesterday  Objective: Filed Vitals:   07/07/13 0500  BP: 156/49  Pulse: 50  Temp: 97.5 F (36.4 C)  Resp: 16    Intake/Output Summary (Last 24 hours) at 07/07/13 1001 Last data filed at 07/07/13 0706  Gross per 24 hour  Intake    100 ml  Output   1500 ml  Net  -1400 ml   Filed Weights   07/06/13 1459 07/06/13 2100  Weight: 90.719 kg (200 lb) 81.421 kg (179 lb 8 oz)    Exam:  General: alert & oriented x 3 In NAD Cardiovascular: RRR, nl S1 s2 Respiratory: CTAB Abdomen: soft +BS NT/ND, no masses palpable Extremities: No cyanosis and no edema, hyperpigmentation/chronic changes    Data Reviewed: Basic Metabolic Panel:  Recent Labs Lab 06/30/13 1508 07/06/13 1625 07/07/13 0410  NA 141  136* 139  K 4.0 4.3 3.4*  CL 104 93* 96  CO2 28 24 27   GLUCOSE 126* 108* 104*  BUN 27* 64* 67*  CREATININE 1.4 2.28* 1.83*  CALCIUM 9.1 9.2 9.1   Liver Function Tests:  Recent Labs Lab 07/07/13 0410  AST 24  ALT 19  ALKPHOS 81  BILITOT 1.6*  PROT 6.8  ALBUMIN 3.7   No results found for this basename: LIPASE, AMYLASE,  in the last 168 hours No results found for this basename: AMMONIA,  in the last 168 hours CBC:  Recent Labs Lab 07/06/13 1625 07/07/13 0410  WBC 9.1 7.8  NEUTROABS 5.8  --   HGB 16.9 16.4  HCT 49.0 48.4  MCV 87.7 87.4  PLT 177 153   Cardiac Enzymes:  Recent Labs Lab 07/06/13 1525 07/06/13 2155  TROPONINI <0.30 <0.30   BNP (last 3 results) No results found for this basename: PROBNP,  in the last 8760 hours CBG: No results found for this basename: GLUCAP,  in the last 168 hours  No results found for this or any previous visit (from the past 240 hour(s)).   Studies: Dg Chest 2 View  07/06/2013   CLINICAL DATA:  Two syncopal episodes in past 24 hr. Current history of aortic stenosis and coronary artery disease. Long-time smoker.  EXAM: CHEST  2 VIEW  COMPARISON:  01/08/2013, 12/31/2010.  FINDINGS: Cardiac silhouette upper normal in size, unchanged. Thoracic aorta mildly atherosclerotic, unchanged. Hilar and mediastinal contours otherwise unremarkable. Lungs clear. Bronchovascular markings normal. Pulmonary vascularity normal. No visible pleural effusions. No pneumothorax. Degenerative changes involving the  thoracic spine. No significant interval change.  IMPRESSION: No acute cardiopulmonary disease.  Stable examination.   Electronically Signed   By: Evangeline Dakin M.D.   On: 07/06/2013 16:12   Ct Head Wo Contrast  07/06/2013   CLINICAL DATA:  Near syncope  EXAM: CT HEAD WITHOUT CONTRAST  TECHNIQUE: Contiguous axial images were obtained from the base of the skull through the vertex without intravenous contrast.  COMPARISON:  None.  FINDINGS: There is  no evidence of mass effect, midline shift or extra-axial fluid collections. There is no evidence of a space-occupying lesion or intracranial hemorrhage. There is no evidence of a cortical-based area of acute infarction. There is generalized mild cerebral atrophy.  The ventricles and sulci are appropriate for the patient's age. The basal cisterns are patent.  Incidental note is made of a left eye prosthesis. The visualized portions of the paranasal sinuses and mastoid air cells are unremarkable.  The osseous structures are unremarkable.  IMPRESSION: No acute intracranial pathology.   Electronically Signed   By: Kathreen Devoid   On: 07/06/2013 16:58    Scheduled Meds: . apixaban  2.5 mg Oral BID  . cefTRIAXone (ROCEPHIN) IVPB 1 gram/50 mL D5W  1 g Intravenous Q24H  . predniSONE  20 mg Oral Q breakfast  . sodium chloride  3 mL Intravenous Q12H   Continuous Infusions:   Principal Problem:   Syncope Active Problems:   Hypertension   Coronary artery disease   Aortic stenosis   Atrial flutter   AKI (acute kidney injury)   BPH (benign prostatic hypertrophy) with urinary retention   S/P TURP (status post transurethral resection of prostate)    Time spent: [facility] Pager [phone]. If 7PM-7AM, please contact night-coverage at [email or web address], password [facility] 07/07/2013, 10:01 AM  LOS: 1 day

## 2013-07-08 DIAGNOSIS — Z7901 Long term (current) use of anticoagulants: Secondary | ICD-10-CM

## 2013-07-08 LAB — VITAMIN B12: VITAMIN B 12: 928 pg/mL — AB (ref 211–911)

## 2013-07-08 LAB — BASIC METABOLIC PANEL
BUN: 57 mg/dL — ABNORMAL HIGH (ref 6–23)
CO2: 24 meq/L (ref 19–32)
CREATININE: 1.23 mg/dL (ref 0.50–1.35)
Calcium: 9.1 mg/dL (ref 8.4–10.5)
Chloride: 99 mEq/L (ref 96–112)
GFR calc Af Amer: 62 mL/min — ABNORMAL LOW (ref >90)
GFR calc non Af Amer: 53 mL/min — ABNORMAL LOW (ref >90)
Glucose, Bld: 109 mg/dL — ABNORMAL HIGH (ref 70–99)
Potassium: 3.9 mEq/L (ref 3.7–5.3)
Sodium: 138 mEq/L (ref 137–147)

## 2013-07-08 LAB — CORTISOL: CORTISOL PLASMA: 25.6 ug/dL

## 2013-07-08 LAB — FOLATE: Folate: >20 ng/mL

## 2013-07-08 MED ORDER — APIXABAN 5 MG PO TABS
5.0000 mg | ORAL_TABLET | Freq: Two times a day (BID) | ORAL | Status: DC
  Administered 2013-07-08 – 2013-07-09 (×3): 5 mg via ORAL
  Filled 2013-07-08 (×4): qty 1

## 2013-07-08 NOTE — Evaluation (Signed)
Physical Therapy Evaluation Patient Details Name: Keith Ellis MRN: [id number] DOB: September 01, 1929 Today's Date: 07/08/2013   History of Present Illness  Patient is a 80 y/o male admitted with syncopal episodes x2 (on 6/28 resulting in fall, 6/29 no fall) positive for orthostatic hypotension during this morning's vitals with PMH positive for A-fib, aortic stenosis, peripheral neuropathy, HTN and L eye enculeation. CT (-), Troponins (-)   Clinical Impression  Patient is a pleasant male presents with balance deficits secondary to peripheral neuropathy and impaired safety awareness putting pt at increased risk for falls. Educated pt and spouse on strategies to avoid dizziness/symptoms related to orthostatic hypotension. Pt would benefit from skilled therapy in the acute care setting to improve mobility and maximize independence so pt can return to PLOF.    Follow Up Recommendations Outpatient PT;Supervision/Assistance - 24 hour    Equipment Recommendations  None recommended by PT    Recommendations for Other Services       Precautions / Restrictions Precautions Precautions: Fall Precaution Comments: Positive for orthostatic hypotension today. Restrictions Weight Bearing Restrictions: No      Mobility  Bed Mobility Overal bed mobility: Modified Independent             General bed mobility comments: Use of hand rail for support.  Transfers Overall transfer level: Needs assistance Equipment used: None Transfers: Sit to/from Stand Sit to Stand: Supervision         General transfer comment: Provided education on importance of waiting a few minutes before changing positions secondary to symptoms of dizziness and hypotension.  Ambulation/Gait Ambulation/Gait assistance: Min guard;Min assist Ambulation Distance (Feet): 300 Feet Assistive device: None Gait Pattern/deviations: Step-through pattern;Staggering left;Staggering right   Gait velocity interpretation: <1.8 ft/sec,  indicative of risk for recurrent falls General Gait Details: Changes in gait speed noted with CoM accelerating too far anterior over BoS resulting in shuffling feet and LOB on 1 occasion. Mild staggering R/L most likely secondary to peripheral neuropathy. Pt reports his balance is improved when wearing shoes.  Stairs Stairs: Yes Stairs assistance: Min guard Stair Management: One rail Right Number of Stairs: 2    Wheelchair Mobility    Modified Rankin (Stroke Patients Only)       Balance Overall balance assessment: Needs assistance Sitting-balance support: No upper extremity supported Sitting balance-Leahy Scale: Good     Standing balance support: No upper extremity supported;During functional activity   Standing balance comment: Feet hip width apart 30 sec eyes open/closed, no LOB. Single Leg Stance - Right Leg: 5 (with 1 UE support and LOB.) Single Leg Stance - Left Leg: 5 (with 1 UE support and LOB) Tandem Stance - Right Leg:  (Modified tandem stance with eyes open, 20 sec, eyes closed 10 sec) Tandem Stance - Left Leg:  (Modified tandem stance eyes open 25 sec, eyes closed 6 sec.) Rhomberg - Eyes Opened: 30 Rhomberg - Eyes Closed: 30 (with increased sway in all directions.)                 Pertinent Vitals/Pain 0/10 pain.    Home Living Family/patient expects to be discharged to:: Private residence Living Arrangements: Spouse/significant other;Children Available Help at Discharge: Family;Available 24 hours/day Type of Home: House Home Access: Stairs to enter Entrance Stairs-Rails: Right Entrance Stairs-Number of Steps: 2 Home Layout: One level Home Equipment: Walker - 2 wheels;Cane - single point      Prior Function Level of Independence: Independent         Comments: Pt drives  and performs IADLs.     Hand Dominance   Dominant Hand: Right    Extremity/Trunk Assessment   Upper Extremity Assessment: Overall WFL for tasks assessed            Lower Extremity Assessment: Overall WFL for tasks assessed;RLE deficits/detail;LLE deficits/detail         Communication   Communication: No difficulties  Cognition Arousal/Alertness: Awake/alert Behavior During Therapy: WFL for tasks assessed/performed Overall Cognitive Status: Within Functional Limits for tasks assessed                      General Comments General comments (skin integrity, edema, etc.): Discoloration present BLEs distal to knee; varicose veins BLEs distal to knee.    Exercises General Exercises - Lower Extremity Ankle Circles/Pumps: Both;20 reps;Seated Hip Flexion/Marching: Both;20 reps;Seated Toe Raises: Both;20 reps;Seated      Assessment/Plan    PT Assessment Patient needs continued PT services  PT Diagnosis Difficulty walking   PT Problem List Decreased balance;Decreased mobility;Impaired sensation;Decreased safety awareness  PT Treatment Interventions Gait training;Neuromuscular re-education;Balance training;Stair training;Functional mobility training;Patient/family education;Therapeutic exercise;Therapeutic activities   PT Goals (Current goals can be found in the Care Plan section) Acute Rehab PT Goals Patient Stated Goal: to get me better PT Goal Formulation: With patient Time For Goal Achievement: 07/15/13 Potential to Achieve Goals: Good    Frequency Min 3X/week   Barriers to discharge        Co-evaluation               End of Session Equipment Utilized During Treatment: Gait belt Activity Tolerance: Patient tolerated treatment well Patient left: in bed;with call bell/phone within reach;with bed alarm set;with family/visitor present Nurse Communication: Mobility status;Precautions         Time: 1165-7903 PT Time Calculation (min): 39 min   Charges:   PT Evaluation $Initial PT Evaluation Tier I: 1 Procedure PT Treatments $Gait Training: 8-22 mins $Neuromuscular Re-education: 8-22 mins   PT G CodesCandy Sledge A 07/08/2013, 3:49 PM Candy Sledge, [place], DPT [phone]

## 2013-07-08 NOTE — Progress Notes (Signed)
Pt had 10 beats of V tach while resting in bed. Pt denied any CP or SOB. NP on call made aware. No new orders received. Will cont to monitor pt.

## 2013-07-08 NOTE — Progress Notes (Signed)
ANTICOAGULATION CONSULT NOTE - Follow-up Consult  Pharmacy Consult for apixaban Indication: atrial fibrillation  Allergies  Allergen Reactions  . Menthol Hives    Patient Measurements: Height: 5' 6.5" (168.9 cm) Weight: 179 lb 6.4 oz (81.375 kg) IBW/kg (Calculated) : 64.95   Vital Signs: Temp: 98.1 F (36.7 C) (07/01 0536) Temp src: Oral (07/01 0536) BP: 174/44 mmHg (07/01 0536) Pulse Rate: 48 (07/01 0536)  Labs:  Recent Labs  07/06/13 1525 07/06/13 1625 07/06/13 2155 07/07/13 0410 07/07/13 1132 07/08/13 0535  HGB  --  16.9  --  16.4  --   --   HCT  --  49.0  --  48.4  --   --   PLT  --  177  --  153  --   --   LABPROT  --   --   --  15.3*  --   --   INR  --   --   --  1.21  --   --   CREATININE  --  2.28*  --  1.83*  --  1.23  TROPONINI <0.30  --  <0.30  --  <0.30  --     Estimated Creatinine Clearance: 47.7 ml/min (by C-G formula based on Cr of 1.23).  Medications:  Apixaban 5mg  bid pta  Assessment: 80 year old male admitted for multiple syncopal episodes. He is on chronic apixaban for afib. Initially pt had some renal dysfunction, however, Scr has improved greatly. Today it is 1.23. CBC is stable and no bleeding noted. Apixaban dose had been reduced due to age and elevated Scr upon admission but is now safe to resume full-dose.   Goal of Therapy:  Therapeutic anticoagulation   Plan:  1. Increase apixaban to 5mg  PO BID 2. F/u renal fxn, S&S of bleeding  Salome Arnt, PharmD, BCPS Pager # [phone] 07/08/2013 8:08 AM

## 2013-07-08 NOTE — Progress Notes (Signed)
Patient ID: Keith Ellis  male  NWG:[id number]    DOB: [birth year]    DOA: 07/06/2013  PCP: [name], MD  Assessment/Plan: Principal Problem:  Syncope- likely secondary to orthostatic hypotension,and dehydration and hypovolemia  - Orthostatics still positive, continue IV fluids, continue to hold the torsemide, lisinopril  -Troponins neg - 2-D echo showed EF of 57-84%, grade 1 diastolic dysfunction, no critical aortic stenosis or regurgitation - Will check also level, B12 and folate  AKI /Acute kidney injury  -Likely prerenal, continue hydration and holding torsemide for now as above  - creatinine function improving  Hypertension  -holding lisinopril due to syncope and history of AKI , monitor and further treat accordingly   Probable UTI  -  urine culture showed 25,000 colonies of group B strep, DC Rocephin   Atrial flutter  -  rate controlled, Continue Apixaban   DVT Prophylaxis: Apixiban  Code Status: full code  Family Communication:  Disposition:Hopefully tomorrow   Consultants:  None   Procedures:  None   Antibiotics:  IV Rocephin discontinued    Subjective: Patient seen and examined, somewhat still dizzy   Objective: Weight change: -9.344 kg (-20 lb 9.6 oz)  Intake/Output Summary (Last 24 hours) at 07/08/13 1045 Last data filed at 07/08/13 0838  Gross per 24 hour  Intake    483 ml  Output   1650 ml  Net  -1167 ml   Blood pressure 154/33, pulse 48, temperature 97.5 F (36.4 C), temperature source Oral, resp. rate 16, height 5' 6.5" (1.689 m), weight 81.375 kg (179 lb 6.4 oz), SpO2 100.00%.  Physical Exam: General: Alert and awake, oriented NAD CVS: S1-S2 clear, no murmur rubs or gallops Chest: clear to auscultation bilaterally, no wheezing, rales or rhonchi Abdomen: soft nontender, nondistended, normal bowel sounds  Extremities: no cyanosis, clubbing or edema noted bilaterally Neuro: Cranial nerves II-XII intact, no focal neurological  deficits  Lab Results: Basic Metabolic Panel:  Recent Labs Lab 07/07/13 0410 07/08/13 0535  NA 139 138  K 3.4* 3.9  CL 96 99  CO2 27 24  GLUCOSE 104* 109*  BUN 67* 57*  CREATININE 1.83* 1.23  CALCIUM 9.1 9.1   Liver Function Tests:  Recent Labs Lab 07/07/13 0410  AST 24  ALT 19  ALKPHOS 81  BILITOT 1.6*  PROT 6.8  ALBUMIN 3.7   No results found for this basename: LIPASE, AMYLASE,  in the last 168 hours No results found for this basename: AMMONIA,  in the last 168 hours CBC:  Recent Labs Lab 07/06/13 1625 07/07/13 0410  WBC 9.1 7.8  NEUTROABS 5.8  --   HGB 16.9 16.4  HCT 49.0 48.4  MCV 87.7 87.4  PLT 177 153   Cardiac Enzymes:  Recent Labs Lab 07/06/13 1525 07/06/13 2155 07/07/13 1132  TROPONINI <0.30 <0.30 <0.30   BNP: No components found with this basename: POCBNP,  CBG: No results found for this basename: GLUCAP,  in the last 168 hours   Micro Results: Recent Results (from the past 240 hour(s))  URINE CULTURE     Status: None   Collection Time    07/06/13  4:38 PM      Result Value Ref Range Status   Specimen Description URINE, CLEAN CATCH   Final   Special Requests NONE   Final   Culture  Setup Time     Final   Value: 07/06/2013 17:00     Performed at [facility]  Final   Value: 25,000 COLONIES/ML     Performed at [facility]   Culture     Final   Value: GROUP B STREP(S.AGALACTIAE)ISOLATED     Note: TESTING AGAINST S. AGALACTIAE NOT ROUTINELY PERFORMED DUE TO PREDICTABILITY OF AMP/PEN/VAN SUSCEPTIBILITY.     Performed at [facility]   Report Status 07/07/2013 FINAL   Final    Studies/Results: Dg Chest 2 View  07/06/2013   CLINICAL DATA:  Two syncopal episodes in past 24 hr. Current history of aortic stenosis and coronary artery disease. Long-time smoker.  EXAM: CHEST  2 VIEW  COMPARISON:  01/08/2013, 12/31/2010.  FINDINGS: Cardiac silhouette upper normal in size, unchanged. Thoracic  aorta mildly atherosclerotic, unchanged. Hilar and mediastinal contours otherwise unremarkable. Lungs clear. Bronchovascular markings normal. Pulmonary vascularity normal. No visible pleural effusions. No pneumothorax. Degenerative changes involving the thoracic spine. No significant interval change.  IMPRESSION: No acute cardiopulmonary disease.  Stable examination.   Electronically Signed   By: Evangeline Dakin M.D.   On: 07/06/2013 16:12   Dg Lumbar Spine 2-3 Views  07/04/2013   CLINICAL DATA:  Intermittent low back pain for 3 months.  EXAM: LUMBAR SPINE - 2-3 VIEW  COMPARISON:  None.  FINDINGS: There are 5 non rib-bearing lumbar type vertebral bodies. Vertebral alignment is normal. Vertebral body heights are preserved without evidence of compression fracture. Moderate endplate osteophytosis is present throughout the lumbar spine. Intervertebral disc space heights are relatively well preserved, with mild disc space narrowing noted at L2-3. Lower lumbar facet arthrosis is noted. Diffuse atherosclerotic aortic calcification is noted.  IMPRESSION: No acute osseous abnormality identified. Moderate lumbar spondylosis.   Electronically Signed   By: Logan Bores   On: 07/04/2013 15:18   Ct Head Wo Contrast  07/06/2013   CLINICAL DATA:  Near syncope  EXAM: CT HEAD WITHOUT CONTRAST  TECHNIQUE: Contiguous axial images were obtained from the base of the skull through the vertex without intravenous contrast.  COMPARISON:  None.  FINDINGS: There is no evidence of mass effect, midline shift or extra-axial fluid collections. There is no evidence of a space-occupying lesion or intracranial hemorrhage. There is no evidence of a cortical-based area of acute infarction. There is generalized mild cerebral atrophy.  The ventricles and sulci are appropriate for the patient's age. The basal cisterns are patent.  Incidental note is made of a left eye prosthesis. The visualized portions of the paranasal sinuses and mastoid air  cells are unremarkable.  The osseous structures are unremarkable.  IMPRESSION: No acute intracranial pathology.   Electronically Signed   By: Kathreen Devoid   On: 07/06/2013 16:58    Medications: Scheduled Meds: . apixaban  5 mg Oral BID  . predniSONE  20 mg Oral Q breakfast  . sodium chloride  3 mL Intravenous Q12H      LOS: 2 days   Rivkah Wolz M.D. [facility] 07/08/2013, 10:45 AM Pager: [phone]  If 7PM-7AM, please contact night-coverage [email or web address] Password [email or web address]  **Disclaimer: This note was dictated with voice recognition software. Similar sounding words can inadvertently be transcribed and this note may contain transcription errors which may not have been corrected upon publication of note.**

## 2013-07-09 ENCOUNTER — Other Ambulatory Visit: Payer: Medicare HMO

## 2013-07-09 LAB — BASIC METABOLIC PANEL
Anion gap: 13 (ref 5–15)
BUN: 36 mg/dL — ABNORMAL HIGH (ref 6–23)
CO2: 24 mEq/L (ref 19–32)
Calcium: 8.8 mg/dL (ref 8.4–10.5)
Chloride: 105 mEq/L (ref 96–112)
Creatinine, Ser: 1 mg/dL (ref 0.50–1.35)
GFR, EST AFRICAN AMERICAN: 79 mL/min — AB (ref >90)
GFR, EST NON AFRICAN AMERICAN: 68 mL/min — AB (ref >90)
Glucose, Bld: 104 mg/dL — ABNORMAL HIGH (ref 70–99)
POTASSIUM: 3.9 meq/L (ref 3.7–5.3)
Sodium: 142 mEq/L (ref 137–147)

## 2013-07-09 MED ORDER — CIPROFLOXACIN HCL 250 MG PO TABS: 250.0000 mg | ORAL_TABLET | Freq: Two times a day (BID) | ORAL | Status: DC

## 2013-07-09 MED ORDER — HYDRALAZINE HCL 25 MG PO TABS
25.0000 mg | ORAL_TABLET | Freq: Two times a day (BID) | ORAL | Status: DC
  Administered 2013-07-09: 25 mg via ORAL
  Filled 2013-07-09 (×3): qty 1

## 2013-07-09 MED ORDER — CIPROFLOXACIN HCL 250 MG PO TABS
250.0000 mg | ORAL_TABLET | Freq: Two times a day (BID) | ORAL | Status: DC
  Administered 2013-07-09: 250 mg via ORAL
  Filled 2013-07-09 (×3): qty 1

## 2013-07-09 MED ORDER — HYDRALAZINE HCL 25 MG PO TABS: 25.0000 mg | ORAL_TABLET | Freq: Two times a day (BID) | ORAL | Status: DC

## 2013-07-09 NOTE — Discharge Instructions (Addendum)
Information on my medicine - ELIQUIS (apixaban)  This medication education was reviewed with me or my healthcare representative as part of my discharge preparation.   Why was Eliquis prescribed for you? Eliquis was prescribed for you to reduce the risk of a blood clot forming that can cause a stroke if you have a medical condition called atrial fibrillation (a type of irregular heartbeat).  What do You need to know about Eliquis ? Take your Eliquis TWICE DAILY - one tablet in the morning and one tablet in the evening with or without food. If you have difficulty swallowing the tablet whole please discuss with your pharmacist how to take the medication safely.  Take Eliquis exactly as prescribed by your doctor and DO NOT stop taking Eliquis without talking to the doctor who prescribed the medication.  Stopping may increase your risk of developing a stroke.  Refill your prescription before you run out.  After discharge, you should have regular check-up appointments with your healthcare provider that is prescribing your Eliquis.  In the future your dose may need to be changed if your kidney function or weight changes by a significant amount or as you get older.  What do you do if you miss a dose? If you miss a dose, take it as soon as you remember on the same day and resume taking twice daily.  Do not take more than one dose of ELIQUIS at the same time to make up a missed dose.  Important Safety Information A possible side effect of Eliquis is bleeding. You should call your healthcare provider right away if you experience any of the following:   Bleeding from an injury or your nose that does not stop.   Unusual colored urine (red or dark [place]) or unusual colored stools (red or black).   Unusual bruising for unknown reasons.   A serious fall or if you hit your head (even if there is no bleeding).  Some medicines may interact with Eliquis and might increase your risk of bleeding or  clotting while on Eliquis. To help avoid this, consult your healthcare provider or pharmacist prior to using any new prescription or non-prescription medications, including herbals, vitamins, non-steroidal anti-inflammatory drugs (NSAIDs) and supplements.  This website has more information on Eliquis (apixaban): [email or web address].no. Syncope Syncope is a medical term for fainting or passing out. This means you lose consciousness and drop to the ground. People are generally unconscious for less than 5 minutes. You may have some muscle twitches for up to 15 seconds before waking up and returning to normal. Syncope occurs more often in older adults, but it can happen to anyone. While most causes of syncope are not dangerous, syncope can be a sign of a serious medical problem. It is important to seek medical care.  CAUSES  Syncope is caused by a sudden drop in blood flow to the brain. The specific cause is often not determined. Factors that can bring on syncope include:  Taking medicines that lower blood pressure.  Sudden changes in posture, such as standing up quickly.  Taking more medicine than prescribed.  Standing in one place for too long.  Seizure disorders.  Dehydration and excessive exposure to heat.  Low blood sugar (hypoglycemia).  Straining to have a bowel movement.  Heart disease, irregular heartbeat, or other circulatory problems.  Fear, emotional distress, seeing blood, or severe pain. SYMPTOMS  Right before fainting, you may:  Feel dizzy or light-headed.  Feel nauseous.  See all white or all  black in your field of vision.  Have cold, clammy skin. DIAGNOSIS  Your health care provider will ask about your symptoms, perform a physical exam, and perform an electrocardiogram (ECG) to record the electrical activity of your heart. Your health care provider may also perform other heart or blood tests to determine the cause of your syncope which may include:  Transthoracic  echocardiogram (TTE). During echocardiography, sound waves are used to evaluate how blood flows through your heart.  Transesophageal echocardiogram (TEE).  Cardiac monitoring. This allows your health care provider to monitor your heart rate and rhythm in real time.  Holter monitor. This is a portable device that records your heartbeat and can help diagnose heart arrhythmias. It allows your health care provider to track your heart activity for several days, if needed.  Stress tests by exercise or by giving medicine that makes the heart beat faster. TREATMENT  In most cases, no treatment is needed. Depending on the cause of your syncope, your health care provider may recommend changing or stopping some of your medicines. HOME CARE INSTRUCTIONS  Have someone stay with you until you feel stable.  Do not drive, use machinery, or play sports until your health care provider says it is okay.  Keep all follow-up appointments as directed by your health care provider.  Lie down right away if you start feeling like you might faint. Breathe deeply and steadily. Wait until all the symptoms have passed.  Drink enough fluids to keep your urine clear or pale yellow.  If you are taking blood pressure or heart medicine, get up slowly and take several minutes to sit and then stand. This can reduce dizziness. SEEK IMMEDIATE MEDICAL CARE IF:   You have a severe headache.  You have unusual pain in the chest, abdomen, or back.  You are bleeding from your mouth or rectum, or you have black or tarry stool.  You have an irregular or very fast heartbeat.  You have pain with breathing.  You have repeated fainting or seizure-like jerking during an episode.  You faint when sitting or lying down.  You have confusion.  You have trouble walking.  You have severe weakness.  You have vision problems. If you fainted, call your local emergency services (911 in [place] Do not drive yourself to the hospital.    MAKE SURE YOU:  Understand these instructions.  Will watch your condition.  Will get help right away if you are not doing well or get worse. Document Released: 12/25/2004 Document Revised: 12/30/2012 Document Reviewed: 02/23/2011 [facility] Patient Information 2015 [place], [place]. This information is not intended to replace advice given to you by your health care provider. Make sure you discuss any questions you have with your health care provider.

## 2013-07-09 NOTE — Care Management Note (Signed)
    Page 1 of 1   07/09/2013     12:33:38 PM CARE MANAGEMENT NOTE 07/09/2013  Patient:  Keith Ellis, Keith Ellis   Account Number:  [id number]  Date Initiated:  07/09/2013  Documentation initiated by:  GRAVES-BIGELOW,Adar Rase  Subjective/Objective Assessment:   Pt admitted for weakness and +UTI. Plan for d/c home today.     Action/Plan:   Pt is refusing [place] services for PT/OT. CM did make him aware that if he needs services to contact his PCP.   Anticipated DC Date:  07/09/2013   Anticipated DC Plan:  [place]  CM consult  Patient refused services      Choice offered to / List presented to:             Status of service:  Completed, signed off Medicare Important Message given?  YES (If response is "NO", the following Medicare IM given date fields will be blank) Date Medicare IM given:  07/09/2013 Medicare IM given by:  GRAVES-BIGELOW,Fatou Dunnigan Date Additional Medicare IM given:   Additional Medicare IM given by:    Discharge Disposition:  HOME/SELF CARE  Per UR Regulation:  Reviewed for med. necessity/level of care/duration of stay  If discussed at [place] of Stay Meetings, dates discussed:    Comments:

## 2013-07-09 NOTE — Progress Notes (Signed)
Pt discharged home with wife.  Reviewed discharge instructions and education, all questions answered.  Assessment unchanged from this morning.

## 2013-07-09 NOTE — Progress Notes (Signed)
Physical Therapy Treatment Patient Details Name: Keith Ellis MRN: [id number] DOB: [birth year] Today's Date: 07/09/2013    History of Present Illness Patient is a 80 y/o male admitted with syncopal episodes x2 (on 6/28 resulting in fall, 6/29 no fall) positive for orthostatic hypotension during this morning's vitals with PMH positive for A-fib, aortic stenosis, peripheral neuropathy, HTN and L eye enculeation. CT (-), Troponins (-)    PT Comments    Patient's balance mildly improved from yesterday however pt continues to have balance deficits during ambulation secondary to peripheral neuropathy (which is premorbid). Education provided on safety techniques/strategies when symptoms of dizziness arise. Pt reluctant to use RW during ambulation. Explained importance of utilizing AD for community ambulation to prevent falls. Pt/family verbalized understanding. Pt would benefit from skilled therapy to improve balance so pt can safely return to PLOF. Pt reports son will be at home for 24/7 support.   Follow Up Recommendations  Home health PT;Supervision/Assistance - 24 hour     Equipment Recommendations  None recommended by PT    Recommendations for Other Services       Precautions / Restrictions Precautions Precautions: Fall Restrictions Weight Bearing Restrictions: No    Mobility  Bed Mobility Overal bed mobility: Independent             General bed mobility comments: HOB flat, no rails.  Transfers Overall transfer level: Needs assistance Equipment used: None Transfers: Sit to/from Stand Sit to Stand: Modified independent (Device/Increase time)         General transfer comment: Pt aware of need to wait before mobilizing after changing positions secondary to dizziness.  Ambulation/Gait Ambulation/Gait assistance: Min guard Ambulation Distance (Feet): 300 Feet (100; 100) Assistive device: None;Rolling walker (2 wheeled);Straight cane Gait Pattern/deviations:  Step-through pattern;Staggering left;Staggering right     General Gait Details: Balance improved from yesterday however staggering still noted at times (reports as baseline since being diagnosed with peripheral neuropathy); able to react appropriately to balance changes. Balance did not change with use of SPC vs RW. Use of RW may improve balance with continued gait training and familiarity (owns RW at home). (Pt reluctant to use RW for support.)   Stairs Stairs: Yes Stairs assistance: Supervision Stair Management: One rail Right Number of Stairs: 2    Wheelchair Mobility    Modified Rankin (Stroke Patients Only)       Balance Overall balance assessment: Needs assistance Sitting-balance support: No upper extremity supported Sitting balance-Leahy Scale: Good     Standing balance support: No upper extremity supported;During functional activity Standing balance-Leahy Scale: Fair                      Cognition Arousal/Alertness: Awake/alert Behavior During Therapy: WFL for tasks assessed/performed Overall Cognitive Status: Within Functional Limits for tasks assessed                      Exercises      General Comments        Pertinent Vitals/Pain Supine BP: 162/36, HR 52 bpm, Sp02 99% Sitting BP: 165/36, HR 56 bpm, Sp02 100% mild dizziness reported initially which resolved within 30 seconds. Standing BP: 150/30, HR 53 bpm Sp02 100%, no dizziness reported. 0/10 pain reported.    Home Living                      Prior Function            PT Goals (current  goals can now be found in the care plan section) Progress towards PT goals: Progressing toward goals    Frequency  Min 3X/week    PT Plan Current plan remains appropriate    Co-evaluation             End of Session Equipment Utilized During Treatment: Gait belt Activity Tolerance: Patient tolerated treatment well Patient left: in bed;with call bell/phone within reach;with  bed alarm set;with nursing/sitter in room;with family/visitor present     Time: 1610-9604 PT Time Calculation (min): 33 min  Charges:  $Gait Training: 8-22 mins $Therapeutic Activity: 8-22 mins                    G CodesCandy Sledge A 07-18-13, 10:32 AM Candy Sledge, PT, DPT [phone]

## 2013-07-09 NOTE — Discharge Summary (Signed)
Physician Discharge Summary  Patient ID: Keith Ellis MRN: [id number] DOB/AGE: 1929-03-29 80 y.o.  Admit date: 07/06/2013 Discharge date: 07/09/2013  Primary Care Physician:  [name], MD  Discharge Diagnoses:    . AKI (acute kidney injury) Orthostatic hypotension with dizziness  Urinary tract infection  . Atrial flutter . Aortic stenosis . Coronary artery disease . BPH (benign prostatic hypertrophy), intermittent hematuria    Consults: None   Recommendations for Outpatient Follow-up:  Patient was recommended home PT, OT  Currently lisinopril and torsemide has been held due to acute renal insufficiency and orthostatic hypotension   Allergies:   Allergies  Allergen Reactions  . Menthol Hives     Discharge Medications:   Medication List    STOP taking these medications       lisinopril 10 MG tablet  Commonly known as:  PRINIVIL,ZESTRIL     torsemide 20 MG tablet  Commonly known as:  DEMADEX      TAKE these medications       B-12 PO  Take 1 capsule by mouth daily.     ciprofloxacin 250 MG tablet  Commonly known as:  CIPRO  Take 1 tablet (250 mg total) by mouth 2 (two) times daily. X 1 week     Coconut Oil 1000 MG Caps  Take 1,000 mg by mouth 2 (two) times daily.     Cod Liver Oil Caps  Take 1 capsule by mouth daily.     ELDERBERRY PO  Take 1 capsule by mouth daily.     ELIQUIS 5 MG Tabs tablet  Generic drug:  apixaban  Take 5 mg by mouth 2 (two) times daily.     hydrALAZINE 25 MG tablet  Commonly known as:  APRESOLINE  Take 1 tablet (25 mg total) by mouth 2 (two) times daily.     multivitamin with minerals Tabs tablet  Take 1 tablet by mouth daily.     predniSONE 20 MG tablet  Commonly known as:  DELTASONE  Take 20 mg by mouth daily with breakfast. For 7 days. Began 07/04/13. Last dose will be 07/11/13         Brief H and P: For complete details please refer to admission H and P, but in brief the patient is 80 year old male with  Past medical history of atrial fibrillation, aortic stenosis, peripheral neuropathy, hypertension, GERD, left eye enucleation.  Patient presented with complaints of syncope. Patient reported that when he woke up in the morning one day prior to admission, he was not feeling good and was feeling dizzy when he was changing his position to standing. He went to church and came back and when he was in the restroom he initially felt dizzy and found himself on the floor. Denied any head injury or neck injury. No focal deficit no incontinence at that point. He was able to stand up on his own and felt occasional dizziness during the day. On the morning, when he went to the drugstore with his son when he came out of the car and was walking to the drugstore again felt dizzy and nearly passed out. No injury to head or neck again.  Otherwise patient denied any complaint of dizziness, vertigo, focal deficit, vision changes, chest pain, palpitation, shortness of breath, cough, nausea, vomiting, abdominal pain, diarrhea, constipation.  He recently has undergone TURP 2 months ago. Denies any burning and continues to have increasing urinary frequency.  Recently he was placed on lisinopril 2.5 mg daily for uncontrolled blood pressure.  2 days prior to admission he was placed on prednisone for continuous back pain.   Hospital Course:   Syncope- likely secondary to orthostatic hypotension,and dehydration and hypovolemia and peripheral neuropathy. Patient was admitted to telemetry and ruled out for acute arrhythmias and acute ACS. Orthostatic vitals were positive. Lisinopril and torsemide was held and patient was placed on IV fluid hydration, creatinine function has normalized from 2.28 to 1.0 at discharge. His symptoms have started improving and has ambulated with physical therapy. He still needs home PT OT to improve his endurance and strength, which was arranged by the case management.  2-D echo showed EF of 55-60%, grade 1  diastolic dysfunction, no critical aortic stenosis or regurgitation  Cortisol level, folate and vitamin B12 level were normal Patient was recommended TED hoses however he reports that he had no significant improvement in the past from the TED hoses  AKI /Acute kidney injury -possibly prerenal due to torsemide or lisinopril, both medications were held and patient was hydrated with IV fluids, creatinine function was 2.28 at the time of admission, now improved to 1.0.   Hypertension -holding lisinopril due to syncope and history of AKI, placed on hydralazine   Probable UTI With intermittent hematuria - urine culture showed 25,000 colonies of group B strep,  patient was initially placed on IV Rocephin. He has had to ciprofloxacin in the past and placed on Cipro for one week. He was admitted to followup with his urologist.   Atrial flutter  - rate controlled, Continue Apixaban Unless hematuria becomes an issue.      Day of Discharge BP 143/42  Pulse 50  Temp(Src) 97.8 F (36.6 C) (Oral)  Resp 16  Ht 5' 6.5" (1.689 m)  Wt 83.144 kg (183 lb 4.8 oz)  BMI 29.15 kg/m2  SpO2 100%  Physical Exam: General: Alert and awake oriented x3 not in any acute distress. HEENT: anicteric sclera, pupils reactive to light and accommodation CVS: S1-S2 clear no murmur rubs or gallops Chest: clear to auscultation bilaterally, no wheezing rales or rhonchi Abdomen: soft nontender, nondistended, normal bowel sounds Extremities: no cyanosis, clubbing or edema noted bilaterally Neuro: Cranial nerves II-XII intact, no focal neurological deficits   The results of significant diagnostics from this hospitalization (including imaging, microbiology, ancillary and laboratory) are listed below for reference.    LAB RESULTS: Basic Metabolic Panel:  Recent Labs Lab 07/08/13 0535 07/09/13 0312  NA 138 142  K 3.9 3.9  CL 99 105  CO2 24 24  GLUCOSE 109* 104*  BUN 57* 36*  CREATININE 1.23 1.00  CALCIUM 9.1 8.8    Liver Function Tests:  Recent Labs Lab 07/07/13 0410  AST 24  ALT 19  ALKPHOS 81  BILITOT 1.6*  PROT 6.8  ALBUMIN 3.7   No results found for this basename: LIPASE, AMYLASE,  in the last 168 hours No results found for this basename: AMMONIA,  in the last 168 hours CBC:  Recent Labs Lab 07/06/13 1625 07/07/13 0410  WBC 9.1 7.8  NEUTROABS 5.8  --   HGB 16.9 16.4  HCT 49.0 48.4  MCV 87.7 87.4  PLT 177 153   Cardiac Enzymes:  Recent Labs Lab 07/06/13 2155 07/07/13 1132  TROPONINI <0.30 <0.30   BNP: No components found with this basename: POCBNP,  CBG: No results found for this basename: GLUCAP,  in the last 168 hours  Significant Diagnostic Studies:  Dg Chest 2 View  07/06/2013   CLINICAL DATA:  Two syncopal episodes in past  24 hr. Current history of aortic stenosis and coronary artery disease. Long-time smoker.  EXAM: CHEST  2 VIEW  COMPARISON:  01/08/2013, 12/31/2010.  FINDINGS: Cardiac silhouette upper normal in size, unchanged. Thoracic aorta mildly atherosclerotic, unchanged. Hilar and mediastinal contours otherwise unremarkable. Lungs clear. Bronchovascular markings normal. Pulmonary vascularity normal. No visible pleural effusions. No pneumothorax. Degenerative changes involving the thoracic spine. No significant interval change.  IMPRESSION: No acute cardiopulmonary disease.  Stable examination.   Electronically Signed   By: Evangeline Dakin M.D.   On: 07/06/2013 16:12   Ct Head Wo Contrast  07/06/2013   CLINICAL DATA:  Near syncope  EXAM: CT HEAD WITHOUT CONTRAST  TECHNIQUE: Contiguous axial images were obtained from the base of the skull through the vertex without intravenous contrast.  COMPARISON:  None.  FINDINGS: There is no evidence of mass effect, midline shift or extra-axial fluid collections. There is no evidence of a space-occupying lesion or intracranial hemorrhage. There is no evidence of a cortical-based area of acute infarction. There is generalized  mild cerebral atrophy.  The ventricles and sulci are appropriate for the patient's age. The basal cisterns are patent.  Incidental note is made of a left eye prosthesis. The visualized portions of the paranasal sinuses and mastoid air cells are unremarkable.  The osseous structures are unremarkable.  IMPRESSION: No acute intracranial pathology.   Electronically Signed   By: Kathreen Devoid   On: 07/06/2013 16:58    2D ECHO: Study Conclusions  - Left ventricle: The cavity size was normal. There was mild focal basal hypertrophy of the septum. Systolic function was normal. The estimated ejection fraction was in the range of 55% to 60%. Wall motion was normal; there were no regional wall motion abnormalities. Doppler parameters are consistent with abnormal left ventricular relaxation (grade 1 diastolic dysfunction). Doppler parameters are consistent with elevated ventricular end-diastolic filling pressure. - Aortic valve: There was mild stenosis. - Mitral valve: Calcified annulus. Mildly thickened leaflets . - Left atrium: The atrium was mildly dilated. - Right ventricle: The cavity size was mildly dilated. Wall thickness was normal. Systolic function was normal. - Right atrium: The atrium was mildly dilated. - Tricuspid valve: There was mild regurgitation. - Pulmonic valve: There was no regurgitation. - Pulmonary arteries: Systolic pressure was mildly increased. PA peak pressure: 40 mm Hg (S).    Disposition and Follow-up: Discharge Instructions   Diet - low sodium heart healthy    Complete by:  As directed      Increase activity slowly    Complete by:  As directed             DISPOSITION: Home  DIET: Heart healthy diet  DISCHARGE FOLLOW-UP Follow-up Information   Follow up with Richardson Dopp, PA-C On 08/03/2013. (for hospital follow-up AT 10:10 AM)    Specialty:  Physician Assistant   Contact information:   [address] [place] [address] [phone]        Follow up with Lenor Coffin, MD. Schedule an appointment as soon as possible for a visit in 2 weeks. (for hospital follow-up)    Specialty:  Neurology   Contact information:   [address] [place] [address] [phone]       Follow up with Alexis Frock, MD. Schedule an appointment as soon as possible for a visit in 10 days. (for hospital follow-up)    Specialty:  Urology   Contact information:   [place] [address]  [phone]       Time spent on Discharge: 40 mins   Signed:   Sigifredo Pignato M.D. [facility] 07/09/2013, 11:28 AM Pager: [phone]   **Disclaimer: This note was dictated with voice recognition software. Similar sounding words can inadvertently be transcribed and this note may contain transcription errors which may not have been corrected upon publication of note.**

## 2013-07-10 ENCOUNTER — Other Ambulatory Visit: Payer: Self-pay | Admitting: Family Medicine

## 2013-07-10 ENCOUNTER — Other Ambulatory Visit: Payer: Self-pay | Admitting: Cardiology

## 2013-07-19 ENCOUNTER — Ambulatory Visit (INDEPENDENT_AMBULATORY_CARE_PROVIDER_SITE_OTHER): Payer: Medicare HMO | Admitting: Family Medicine

## 2013-07-19 VITALS — BP 158/64 | HR 48 | Temp 98.0°F | Resp 14 | Ht 66.5 in | Wt 194.6 lb

## 2013-07-19 DIAGNOSIS — Z5189 Encounter for other specified aftercare: Secondary | ICD-10-CM

## 2013-07-19 DIAGNOSIS — I951 Orthostatic hypotension: Secondary | ICD-10-CM

## 2013-07-19 DIAGNOSIS — T671XXD Heat syncope, subsequent encounter: Secondary | ICD-10-CM

## 2013-07-19 DIAGNOSIS — D509 Iron deficiency anemia, unspecified: Secondary | ICD-10-CM

## 2013-07-19 DIAGNOSIS — K219 Gastro-esophageal reflux disease without esophagitis: Secondary | ICD-10-CM

## 2013-07-19 DIAGNOSIS — I4892 Unspecified atrial flutter: Secondary | ICD-10-CM

## 2013-07-19 DIAGNOSIS — I359 Nonrheumatic aortic valve disorder, unspecified: Secondary | ICD-10-CM

## 2013-07-19 DIAGNOSIS — I350 Nonrheumatic aortic (valve) stenosis: Secondary | ICD-10-CM

## 2013-07-19 DIAGNOSIS — N390 Urinary tract infection, site not specified: Secondary | ICD-10-CM

## 2013-07-19 DIAGNOSIS — N179 Acute kidney failure, unspecified: Secondary | ICD-10-CM

## 2013-07-19 LAB — POCT URINALYSIS DIPSTICK
BILIRUBIN UA: NEGATIVE
Blood, UA: NEGATIVE
GLUCOSE UA: NEGATIVE
Ketones, UA: NEGATIVE
Nitrite, UA: NEGATIVE
Spec Grav, UA: 1.015
UROBILINOGEN UA: 0.2
pH, UA: 5

## 2013-07-19 LAB — CBC WITH DIFFERENTIAL/PLATELET
Basophils Absolute: 0 10*3/uL (ref 0.0–0.1)
Basophils Relative: 0 % (ref 0–1)
Eosinophils Absolute: 0.1 10*3/uL (ref 0.0–0.7)
Eosinophils Relative: 1 % (ref 0–5)
HCT: 39.8 % (ref 39.0–52.0)
Hemoglobin: 13.5 g/dL (ref 13.0–17.0)
LYMPHS ABS: 1.5 10*3/uL (ref 0.7–4.0)
LYMPHS PCT: 24 % (ref 12–46)
MCH: 29.4 pg (ref 26.0–34.0)
MCHC: 33.9 g/dL (ref 30.0–36.0)
MCV: 86.7 fL (ref 78.0–100.0)
Monocytes Absolute: 0.6 10*3/uL (ref 0.1–1.0)
Monocytes Relative: 9 % (ref 3–12)
NEUTROS PCT: 66 % (ref 43–77)
Neutro Abs: 4.2 10*3/uL (ref 1.7–7.7)
PLATELETS: 112 10*3/uL — AB (ref 150–400)
RBC: 4.59 MIL/uL (ref 4.22–5.81)
RDW: 14.5 % (ref 11.5–15.5)
WBC: 6.3 10*3/uL (ref 4.0–10.5)

## 2013-07-19 LAB — POCT UA - MICROSCOPIC ONLY
CASTS, UR, LPF, POC: NEGATIVE
Crystals, Ur, HPF, POC: NEGATIVE
Epithelial cells, urine per micros: NEGATIVE
Mucus, UA: POSITIVE
Yeast, UA: NEGATIVE

## 2013-07-19 LAB — BASIC METABOLIC PANEL
BUN: 16 mg/dL (ref 6–23)
CHLORIDE: 108 meq/L (ref 96–112)
CO2: 25 meq/L (ref 19–32)
Calcium: 8.6 mg/dL (ref 8.4–10.5)
Creat: 1.05 mg/dL (ref 0.50–1.35)
Glucose, Bld: 86 mg/dL (ref 70–99)
Potassium: 4.8 mEq/L (ref 3.5–5.3)
Sodium: 139 mEq/L (ref 135–145)

## 2013-07-19 MED ORDER — POTASSIUM CHLORIDE CRYS ER 20 MEQ PO TBCR: 20.0000 meq | EXTENDED_RELEASE_TABLET | Freq: Every day | ORAL | Status: DC

## 2013-07-19 MED ORDER — FUROSEMIDE 40 MG PO TABS: 40.0000 mg | ORAL_TABLET | Freq: Every day | ORAL | Status: DC

## 2013-07-19 NOTE — Patient Instructions (Addendum)
I will be in the office on Wednesday from 9-12 a.m. Or Thursday from 8-2 p.m.   Dr. Joseph Art will be in the office all day Friday, Saturday, and Sunday (remember we close at 4 p.m. On the weekends). I think you are retaining fluid as your weight has been gradually increasing so lets restart a lower dose if the diuretic. Do not spend time outside this week - it is TO HOT - YOU WILL END UP DEHYDRATED.   As long as you are still doing well when you come back and are stable on restarting the medications, I think it would be reasonable to let you drive again. Call the office for instruction if you gain > 3 lbs overnight or >5 lbs in 1 week. Continue taking your weights and blood pressure daily and bring them to your next visit again.  This is VERY VERY VERY helpful in helping [place] be proactive and keep you out of the hospital.  Orthostatic Hypotension Orthostatic hypotension is a sudden drop in blood pressure. It happens when you quickly stand up from a seated or lying position. You may feel dizzy or light-headed. This can last for just a few seconds or for up to a few minutes. It is usually not a serious problem. However, if this happens frequently or gets worse, it can be a sign of something more serious. CAUSES  Different things can cause orthostatic hypotension, including:   Loss of body fluids (dehydration).  Medicines that lower blood pressure.  Sudden changes in posture, such as standing up quickly after you have been sitting or lying down.  Taking too much of your medicine. SIGNS AND SYMPTOMS   Light-headedness or dizziness.   Fainting or near-fainting.   A fast heart rate.   Weakness.   Feeling tired (fatigue).  DIAGNOSIS  Your health care provider may do several things to help diagnose your condition and identify the cause. These may include:   Taking a medical history and doing a physical exam.  Checking your blood pressure. Your health care provider will check your  blood pressure when you are:  Lying down.  Sitting.  Standing.  Using tilt table testing. In this test, you lie down on a table that moves from a lying position to a standing position. You will be strapped onto the table. This test monitors your blood pressure and heart rate when you are in different positions. TREATMENT  Treatment will vary depending on the cause. Possible treatments include:   Changing the dosage of your medicines.  Wearing compression stockings on your lower legs.  Standing up slowly after sitting or lying down.  Eating more salt.  Eating frequent, small meals.  In some cases, getting IV fluids.  Taking medicine to enhance fluid retention. HOME CARE INSTRUCTIONS  Only take over-the-counter or prescription medicines as directed by your health care provider.  Follow your health care provider's instructions for changing the dosage of your current medicines.  Do not stop or adjust your medicine on your own.  Stand up slowly after sitting or lying down. This allows your body to adjust to the different position.  Wear compression stockings as directed.  Eat extra salt as directed.  Do not add extra salt to your diet unless directed to by your health care provider.  Eat frequent, small meals.  Avoid standing suddenly after eating.  Avoid hot showers or excessive heat as directed by your health care provider.  Keep all follow-up appointments. SEEK MEDICAL CARE IF:  You continue to feel dizzy or light-headed after standing.  You feel groggy or confused.  You feel cold, clammy, or sick to your stomach (nauseous).  You have blurred vision.  You feel short of breath. SEEK IMMEDIATE MEDICAL CARE IF:   You faint after standing.  You have chest pain.  You have difficulty breathing.   You lose feeling or movement in your arms or legs.   You have slurred speech or difficulty talking, or you are unable to talk.  MAKE SURE YOU:    Understand these instructions.  Will watch your condition.  Will get help right away if you are not doing well or get worse. Document Released: 12/15/2001 Document Revised: 12/30/2012 Document Reviewed: 10/17/2012 [facility] Patient Information 2015 [place], [place]. This information is not intended to replace advice given to you by your health care provider. Make sure you discuss any questions you have with your health care provider.

## 2013-07-19 NOTE — Progress Notes (Addendum)
Subjective:   This chart was scribed for Delman Cheadle MD by Forrestine Him, [facility] Scribe. This patient was seen in room 3 and the patient's care was started 11:21 AM.    Patient ID: Keith Ellis, male    DOB: [birth year], 80 y.o.   MRN: [id number]  Chief Complaint  Patient presents with  . Follow-up    hospital follow up  . Stool Color Change    black tarry stools x 3 days  . Back Pain    previous UTI     HPI  HPI Comments: Keith Ellis is a 80 y.o. Male with a PMHx of GERD, HTN, coronary artery disease, hyperlidemia who presents to [facility] for hospital follow-up today. Pt was hospitalized from 6/29-7/2 for acute renal failure and orthostatic hypotension due to UTI. Lisinopril and Torsemide were held and pt was discharge on Cipro 250 mg BID x 1 week as well as a Prednisone 20 mg daily x 7 days. Pt had undergone TURP 2 months prior and was very recently started on Lisinopril prior to admission. Also started on prednisone prior to admission for back pain. During pt hospitalization creatinine went from 2.28 to 1.0. Recommended to have at home PT and OT and recommended to wear compression stockings. However, pt declined compression stockings. Urine culture showed 25,000 colonies group B strep and initially treated with IV Rocephin but transitioned to PO Cipro upon discharge. Pt was advised to follow up with Urology and advised to follow up with cardiology 7/27.Pt was also advised to follow up with Neurology after discharge.  Pt admits to an enlarged prostate in which he has experienced 3 UTIs since onset of diagnosis. Pts weight typically fluctuates between 186-190 at baseline. He has not been on any Furosamide the last 2 weeks. He admits to worsening leg swelling bilaterally since onset of stopping his most recent fluid pill.  Pt is recently started on Hydralazine since hospital stay. Pt went off of Prilosec about 2 years ago. He is taking a  multivitamin at this time and started he was taking supplement prior to prostate issues. However, he is not taking any iron supplements. No medications taken daily for stomach issues.  He admits to dark colored stools onset 2-3 days.  Daughter in law states he has had some issues with neuropathy which he currently has a follow up appointment scheduled in the near future.    Pt states he is breathing ok without any difficulty and states he sleeps with 3 pillows underneath him at night time. Head is always elevated at night time. This has been consistent for the past 2 years.  Past Medical History  Diagnosis Date  . GERD (gastroesophageal reflux disease)   . Hypertension   . Blind     Right enucleation  . Hearing loss   . Coronary artery disease     Minimal LAD stenosis 2007  . BPH (benign prostatic hypertrophy)   . Hyperlipidemia   . Varicose vein   . Peripheral neuropathy   . Tremor, essential   . Aortic sclerosis     2008  . Aortic stenosis     Mild, echo, January, 2014  . Atrial flutter     December, 2013  . Chronic anticoagulation     Apixaban started December 27, 2011  . Edema     December, 2013  . CHF (congestive heart failure)   . Hearing difficulty of both ears  hearing aids  . Urine retention 01/08/2013  . Depression   . Urinary catheter in place   . Syncope 07/06/2013    Current Outpatient Prescriptions on File Prior to Visit  Medication Sig Dispense Refill  . Coconut Oil 1000 MG CAPS Take 1,000 mg by mouth 2 (two) times daily.      Marland Kitchen Cod Liver Oil CAPS Take 1 capsule by mouth daily.      . Cyanocobalamin (B-12 PO) Take 1 capsule by mouth daily.      Marland Kitchen ELDERBERRY PO Take 1 capsule by mouth daily.      Marland Kitchen ELIQUIS 5 MG TABS tablet TAKE ONE TABLET BY MOUTH TWICE DAILY  60 tablet  1  . hydrALAZINE (APRESOLINE) 25 MG tablet Take 1 tablet (25 mg total) by mouth 2 (two) times daily.  60 tablet  3  . Multiple Vitamin (MULTIVITAMIN WITH MINERALS) TABS tablet Take 1  tablet by mouth daily.      . ciprofloxacin (CIPRO) 250 MG tablet Take 1 tablet (250 mg total) by mouth 2 (two) times daily. X 1 week  14 tablet  0  . predniSONE (DELTASONE) 20 MG tablet Take 20 mg by mouth daily with breakfast. For 7 days. Began 07/04/13. Last dose will be 07/11/13       No current facility-administered medications on file prior to visit.    Allergies  Allergen Reactions  . Menthol Hives    Review of Systems  Constitutional: Negative for fever and chills.  Respiratory: Negative for apnea, cough, chest tightness, shortness of breath and stridor.   Cardiovascular: Positive for leg swelling. Negative for chest pain and palpitations.  Gastrointestinal: Positive for diarrhea. Negative for nausea, vomiting, abdominal pain, constipation, abdominal distention and anal bleeding.  Genitourinary: Positive for frequency and difficulty urinating. Negative for dysuria, urgency, hematuria and decreased urine volume.  Musculoskeletal: Positive for arthralgias.  Skin: Negative for rash.  Neurological: Positive for light-headedness. Negative for dizziness, seizures, syncope, facial asymmetry, speech difficulty, weakness, numbness and headaches.  Hematological: Negative for adenopathy.  Psychiatric/Behavioral: Negative for confusion and sleep disturbance.    Triage Vitals: BP 124/56  Pulse 46  Temp(Src) 98 F (36.7 C) (Oral)  Resp 14  Ht 5' 6.5" (1.689 m)  Wt 194 lb 9.6 oz (88.27 kg)  BMI 30.94 kg/m2  SpO2 98%   Objective:  Physical Exam  Nursing note and vitals reviewed. Constitutional: He is oriented to person, place, and time. He appears well-developed and well-nourished.  HENT:  Head: Normocephalic.  Eyes: EOM are normal.  Neck: Normal range of motion.  Cardiovascular: An irregular rhythm present. Bradycardia present.   Murmur heard. 3/6 blowing systolic murmur  Pulmonary/Chest: Effort normal and breath sounds normal. He has no rales.  Good air movement  Abdominal: He  exhibits no distension.  Musculoskeletal: Normal range of motion.  1 plus pitting edema on R lower extremity  2 plus pitting edema on L lower extremity  Neurological: He is alert and oriented to person, place, and time.  Skin:  Stasis dermatitis with swelling to lower extremities  Psychiatric: He has a normal mood and affect.   Results for orders placed in visit on 07/19/13  POCT URINALYSIS DIPSTICK      Result Value Ref Range   Color, UA yellow     Clarity, UA clear     Glucose, UA neg     Bilirubin, UA neg     Ketones, UA neg     Spec Grav, UA 1.015  Blood, UA neg     pH, UA 5.0     Protein, UA trace     Urobilinogen, UA 0.2     Nitrite, UA neg     Leukocytes, UA Trace    POCT UA - MICROSCOPIC ONLY      Result Value Ref Range   WBC, Ur, HPF, POC 10-11     RBC, urine, microscopic 9-14     Bacteria, U Microscopic 1+     Mucus, UA pos     Epithelial cells, urine per micros neg     Crystals, Ur, HPF, POC neg     Casts, Ur, LPF, POC neg     Yeast, UA neg        Assessment & Plan:  Orthostatic syncope - Plan: CBC with Differential, Basic metabolic panel - negative orthostatics today on exam - it seems clear that his pre-syncopal episode was related to orthostatic hypotension caused by dehydration so I am inclined to give persmission to pt to resume driving at next f/u OV if orthostatics are still negative after resuming diuretic. Advised pt to refrain from participating in extraneous activities outdoors such as mowing the lawn.   Urinary tract infection, site not specified - Plan: Urine culture, POCT urinalysis dipstick, POCT UA - Microscopic Only - 25,000 CFU of GBS on voided specimen in hospital so I'm disinclined to think that this was a true cystitis.  Pt has completed full course of cipro, has f/u w/ urology. Recheck UClx today.  Iron deficiency anemia - Plan: IFOBT POC (occult bld, rslt in office), IFOBT POC (occult bld, rslt in office), IFOBT POC (occult bld, rslt in  office) - history of IDA per chart, recent hgb and MCV nml and pt is UTD on colonoscopy per pt.  I suspect his "black, tarry stools" are due to his recent peptobismol use but will check stat cbc to ensure hgb stable and have pt due home HOC - bring back into clinic at time of recheck in 4d.  Heat syncope, subsequent encounter  Gastroesophageal reflux disease, esophagitis presence not specified - off of ppi, ok to cont prn peptobismol for now  Aortic stenosis - echo in hosp showed EF 55-60% w/ grade 1 diastolic dysfunction. However, pt has been maintained on high dose diuretics due to lower ext edema (was on torsemide qd and metolazone qwk prior to hosp). His weight increased by 3 lbs o/n 2d ago and is now up a total of 4 lbs so we will restart a loop diuretic and have pt cont to monitor daily weights - bring to f/u in 4d and recheck bmp at that time as well.  Atrial flutter, unspecified - rate controlled  Acute renal failure, unspecified acute renal failure type - returned to baseline of 1.1 on hosp discharge - was prerenal due to dehydation and meds.  Restart loop diuretic due to increasing weights and lower ext edema - recheck bmp at f/u and consider restarting low dose lisinopril when stable  HTN - on hydralazine 25 bid while lisinorpil and torsemide where d/c'd after hosp. - start lasix and cont to check bmp bid outside office record and bring to f/u - if >120/80, cut hydralazine in 1/2 - will hopefully be able to go off hydralazine and resume diuretic and lisinopril in future. Reviewed w/ pt and family dangers of low BP - we would rather have mild/mod elevation in pt than hypotension so goal BP [phone] systolic at lowest in pt.  Meds ordered this  encounter  Medications  . furosemide (LASIX) 40 MG tablet    Sig: Take 1 tablet (40 mg total) by mouth daily.    Dispense:  30 tablet    Refill:  0  . potassium chloride SA (K-DUR,KLOR-CON) 20 MEQ tablet    Sig: Take 1 tablet (20 mEq total) by  mouth daily. Along with lasix    Dispense:  30 tablet    Refill:  0    I personally performed the services described in this documentation, which was scribed in my presence. The recorded information has been reviewed and considered, and addended by me as needed.  Delman Cheadle, MD MPH  Results for orders placed in visit on 07/19/13  CBC WITH DIFFERENTIAL      Result Value Ref Range   WBC 6.3  4.0 - 10.5 K/uL   RBC 4.59  4.22 - 5.81 MIL/uL   Hemoglobin 13.5  13.0 - 17.0 g/dL   HCT 39.8  39.0 - 52.0 %   MCV 86.7  78.0 - 100.0 fL   MCH 29.4  26.0 - 34.0 pg   MCHC 33.9  30.0 - 36.0 g/dL   RDW 14.5  11.5 - 15.5 %   Platelets 112 (*) 150 - 400 K/uL   Neutrophils Relative % 66  43 - 77 %   Neutro Abs 4.2  1.7 - 7.7 K/uL   Lymphocytes Relative 24  12 - 46 %   Lymphs Abs 1.5  0.7 - 4.0 K/uL   Monocytes Relative 9  3 - 12 %   Monocytes Absolute 0.6  0.1 - 1.0 K/uL   Eosinophils Relative 1  0 - 5 %   Eosinophils Absolute 0.1  0.0 - 0.7 K/uL   Basophils Relative 0  0 - 1 %   Basophils Absolute 0.0  0.0 - 0.1 K/uL   Smear Review Criteria for review not met    BASIC METABOLIC PANEL      Result Value Ref Range   Sodium 139  135 - 145 mEq/L   Potassium 4.8  3.5 - 5.3 mEq/L   Chloride 108  96 - 112 mEq/L   CO2 25  19 - 32 mEq/L   Glucose, Bld 86  70 - 99 mg/dL   BUN 16  6 - 23 mg/dL   Creat 1.05  0.50 - 1.35 mg/dL   Calcium 8.6  8.4 - 10.5 mg/dL

## 2013-07-21 MED ORDER — SULFAMETHOXAZOLE-TMP DS 800-160 MG PO TABS: 1.0000 | ORAL_TABLET | Freq: Two times a day (BID) | ORAL | Status: DC

## 2013-07-21 NOTE — Addendum Note (Signed)
Addended by: Delman Cheadle on: 07/21/2013 11:57 AM   Modules accepted: Orders

## 2013-07-22 ENCOUNTER — Ambulatory Visit (INDEPENDENT_AMBULATORY_CARE_PROVIDER_SITE_OTHER): Payer: Medicare HMO | Admitting: Family Medicine

## 2013-07-22 VITALS — BP 142/44 | HR 47 | Temp 97.6°F | Resp 16 | Ht 66.5 in | Wt 199.6 lb

## 2013-07-22 DIAGNOSIS — R609 Edema, unspecified: Secondary | ICD-10-CM

## 2013-07-22 DIAGNOSIS — D696 Thrombocytopenia, unspecified: Secondary | ICD-10-CM

## 2013-07-22 DIAGNOSIS — M545 Low back pain, unspecified: Secondary | ICD-10-CM

## 2013-07-22 DIAGNOSIS — Z79899 Other long term (current) drug therapy: Secondary | ICD-10-CM

## 2013-07-22 DIAGNOSIS — Z7901 Long term (current) use of anticoagulants: Secondary | ICD-10-CM

## 2013-07-22 DIAGNOSIS — I10 Essential (primary) hypertension: Secondary | ICD-10-CM

## 2013-07-22 DIAGNOSIS — N390 Urinary tract infection, site not specified: Secondary | ICD-10-CM

## 2013-07-22 LAB — BASIC METABOLIC PANEL
BUN: 21 mg/dL (ref 6–23)
CALCIUM: 8.5 mg/dL (ref 8.4–10.5)
CO2: 27 mEq/L (ref 19–32)
Chloride: 109 mEq/L (ref 96–112)
Creat: 0.97 mg/dL (ref 0.50–1.35)
Glucose, Bld: 82 mg/dL (ref 70–99)
Potassium: 4.6 mEq/L (ref 3.5–5.3)
Sodium: 141 mEq/L (ref 135–145)

## 2013-07-22 LAB — POCT UA - MICROSCOPIC ONLY
CASTS, UR, LPF, POC: NEGATIVE
CRYSTALS, UR, HPF, POC: NEGATIVE
Yeast, UA: NEGATIVE

## 2013-07-22 LAB — CBC WITH DIFFERENTIAL/PLATELET
BASOS ABS: 0 10*3/uL (ref 0.0–0.1)
BASOS PCT: 0 % (ref 0–1)
EOS PCT: 1 % (ref 0–5)
Eosinophils Absolute: 0.1 10*3/uL (ref 0.0–0.7)
HCT: 38.8 % — ABNORMAL LOW (ref 39.0–52.0)
Hemoglobin: 13.1 g/dL (ref 13.0–17.0)
LYMPHS PCT: 23 % (ref 12–46)
Lymphs Abs: 1.2 10*3/uL (ref 0.7–4.0)
MCH: 29.6 pg (ref 26.0–34.0)
MCHC: 33.8 g/dL (ref 30.0–36.0)
MCV: 87.6 fL (ref 78.0–100.0)
Monocytes Absolute: 0.4 10*3/uL (ref 0.1–1.0)
Monocytes Relative: 8 % (ref 3–12)
NEUTROS ABS: 3.5 10*3/uL (ref 1.7–7.7)
Neutrophils Relative %: 68 % (ref 43–77)
PLATELETS: 94 10*3/uL — AB (ref 150–400)
RBC: 4.43 MIL/uL (ref 4.22–5.81)
RDW: 14.5 % (ref 11.5–15.5)
WBC: 5.2 10*3/uL (ref 4.0–10.5)

## 2013-07-22 LAB — IFOBT (OCCULT BLOOD)
IMMUNOLOGICAL FECAL OCCULT BLOOD TEST: NEGATIVE
IMMUNOLOGICAL FECAL OCCULT BLOOD TEST: NEGATIVE
IMMUNOLOGICAL FECAL OCCULT BLOOD TEST: NEGATIVE

## 2013-07-22 LAB — POCT URINALYSIS DIPSTICK
Bilirubin, UA: NEGATIVE
Glucose, UA: NEGATIVE
Nitrite, UA: NEGATIVE
PH UA: 5
SPEC GRAV UA: 1.02
UROBILINOGEN UA: 2

## 2013-07-22 LAB — URINE CULTURE: Colony Count: 25000

## 2013-07-22 MED ORDER — TORSEMIDE 20 MG PO TABS: 20.0000 mg | ORAL_TABLET | Freq: Two times a day (BID) | ORAL | Status: DC

## 2013-07-22 NOTE — Patient Instructions (Addendum)
Stop your hydralazine. Stop your furosemide (lasix). Restart your prior torsemide (demadex) twice a day.  A refill was sent to your pharmacy but you can use what you have at home as well. Continue taking your potassium pill once a day. Continue checking your blood pressure and your weight at least once a day but twice a day is great.  If your weight continues to increase (3 lbs overnight or 5 lbs in 1 wk) call us.  We want to get your weight back to 185-186 lbs and if it is not getting there, we may need to restart your prior zaroxolyn (metolazone) once a week.  Lets check back in 1-2 weeks to review your weights and blood pressure. Come back to see me on Tuesday 9/21 from 9-2 or Thursday 9/23 from 9-2. Do pick up the Bactrim antibiotic to start twice a day for your urine as there does still seem to be bacteria in your urine. Make an appointment with your urologist about recurrent urine infections.  YOU CAN DRIVE - YOU ARE NOT SHOWING ANY SIGNS OF DEHYDRATION OR RISK OF PASSING OUT.  HOWEVER, I AM CONCERNED THAT AT SOME POINT IN THE FUTURE YOU WILL BE NO LONGER ABLE TO DRIVE DUE TO YOUR NEUROPATHY OR OTHER MEDICAL PROBLEMS SO CONSIDER PREPARING FOR THIS.
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
## 2013-07-23 ENCOUNTER — Encounter: Payer: Self-pay | Admitting: Family Medicine

## 2013-07-23 LAB — URINE CULTURE
Colony Count: NO GROWTH
ORGANISM ID, BACTERIA: NO GROWTH

## 2013-07-23 MED ORDER — NITROFURANTOIN MONOHYD MACRO 100 MG PO CAPS: 100.0000 mg | ORAL_CAPSULE | Freq: Two times a day (BID) | ORAL | Status: DC

## 2013-07-27 ENCOUNTER — Encounter: Payer: Self-pay | Admitting: Adult Health

## 2013-07-27 ENCOUNTER — Ambulatory Visit (INDEPENDENT_AMBULATORY_CARE_PROVIDER_SITE_OTHER): Payer: Medicare HMO | Admitting: Adult Health

## 2013-07-27 VITALS — BP 137/48 | HR 51 | Ht 65.5 in | Wt 189.5 lb

## 2013-07-27 DIAGNOSIS — G609 Hereditary and idiopathic neuropathy, unspecified: Secondary | ICD-10-CM

## 2013-07-27 DIAGNOSIS — G629 Polyneuropathy, unspecified: Secondary | ICD-10-CM

## 2013-07-27 DIAGNOSIS — G250 Essential tremor: Secondary | ICD-10-CM

## 2013-07-27 DIAGNOSIS — G252 Other specified forms of tremor: Secondary | ICD-10-CM

## 2013-07-27 NOTE — Patient Instructions (Signed)

## 2013-07-27 NOTE — Progress Notes (Signed)
I have read the note, and I agree with the clinical assessment and plan.  WILLIS,CHARLES KEITH   
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
## 2013-07-30 ENCOUNTER — Ambulatory Visit (INDEPENDENT_AMBULATORY_CARE_PROVIDER_SITE_OTHER): Payer: Medicare HMO | Admitting: Family Medicine

## 2013-07-30 VITALS — BP 138/48 | HR 48 | Temp 97.6°F | Resp 16 | Ht 67.5 in | Wt 188.4 lb

## 2013-07-30 DIAGNOSIS — Z7901 Long term (current) use of anticoagulants: Secondary | ICD-10-CM

## 2013-07-30 DIAGNOSIS — I350 Nonrheumatic aortic (valve) stenosis: Secondary | ICD-10-CM

## 2013-07-30 DIAGNOSIS — I4892 Unspecified atrial flutter: Secondary | ICD-10-CM

## 2013-07-30 DIAGNOSIS — I10 Essential (primary) hypertension: Secondary | ICD-10-CM

## 2013-07-30 DIAGNOSIS — R609 Edema, unspecified: Secondary | ICD-10-CM

## 2013-07-30 DIAGNOSIS — I359 Nonrheumatic aortic valve disorder, unspecified: Secondary | ICD-10-CM

## 2013-07-30 DIAGNOSIS — N179 Acute kidney failure, unspecified: Secondary | ICD-10-CM

## 2013-07-30 DIAGNOSIS — K219 Gastro-esophageal reflux disease without esophagitis: Secondary | ICD-10-CM

## 2013-07-30 DIAGNOSIS — D696 Thrombocytopenia, unspecified: Secondary | ICD-10-CM

## 2013-07-30 LAB — POCT CBC
Granulocyte percent: 57.1 %G (ref 37–80)
HEMATOCRIT: 45.4 % (ref 43.5–53.7)
HEMOGLOBIN: 14.9 g/dL (ref 14.1–18.1)
LYMPH, POC: 1.8 (ref 0.6–3.4)
MCH: 29.6 pg (ref 27–31.2)
MCHC: 32.8 g/dL (ref 31.8–35.4)
MCV: 90.3 fL (ref 80–97)
MID (cbc): 0.4 (ref 0–0.9)
MPV: 8.1 fL (ref 0–99.8)
POC Granulocyte: 3 (ref 2–6.9)
POC LYMPH PERCENT: 34.6 %L (ref 10–50)
POC MID %: 8.3 % (ref 0–12)
Platelet Count, POC: 139 10*3/uL — AB (ref 142–424)
RBC: 5.03 M/uL (ref 4.69–6.13)
RDW, POC: 14.7 %
WBC: 5.2 10*3/uL (ref 4.6–10.2)

## 2013-07-30 NOTE — Patient Instructions (Signed)
Take all of the medicines you are currently taking with you to your cardiologists.  They may want to restart a very low dose of lisinopril but at this point I don't really want to lower your blood pressure any further and you need the torsemide for your swelling so for now do not take (or restart) your lisinopril. Per my records - you have not been taking it since your hospitalization so do NOT restart now (unless otherwise instructed by cardiologists.

## 2013-07-30 NOTE — Progress Notes (Addendum)
Subjective:  This chart was scribed for Keith Cheadle, MD by Ladene Artist, ED Scribe. The patient was seen in room 2. Patient's care was started at 1:12 PM.   Patient ID: Keith Ellis, male    DOB: Aug 25, 1929, 80 y.o.   MRN: [id number]  Chief Complaint  Patient presents with  . Follow-up    for visit on 07/22/13   HPI HPI Comments: Keith Ellis is a 80 y.o. male, with a h/o peripheral neuropathy, who presents to the [facility] for follow-up for visit on 07/22/13. Pt has been seen by Dr. Brigitte Pulse since hospitalization 1 month previously for orthostatic syncopy secondary to dehydration. Pt reported increaseing weight gain and edema; started back dieretics as acute renal failure had resolved. He has been monitoring BP and weight regularly x 2 daily as we adjust his dieretic dose. Also has had several possible UTIs but suspects contaminations. Last urine culture was finally negative 1 week prior. Pt was taken off all antibiotics at that point. Concerned pt was having melanotic stools but seems to be due to Pepto-Bismol only. Pt has been complaining of increasing back pain; instructed to take Tylenol. Pt has peripheral neuropathy, saw neurology 3 days ago; pt does not want medications for this. Cardiology visit scheduled for 08/03/13.  Pt's weight was 180 last night, 181 this am. Average BP readings of 110s-130s/40-60s. Pt states that light-headedness has not changed much but has improved at minium. Pt discusses restarting Prilosec due to heartburn after eating. Pt denies change in urination. Pt was given probiotic by Dr. Joseph Art but states that he did not have a chance to get the prescription filled. He has not yet made an appointment with Dr. Tammi Klippel, urologist. Pt still reports unchanged back pain as well as intermittent constipation. Pt states that his constipation is resolved with prune juice, sauerkraut and a little bit of sharp cheese.  Pt states that he followed up with  neurologist who felt that pt was doing fine. Pt states that he was offered medication if the pain in his feet worsened.   Past Medical History  Diagnosis Date  . GERD (gastroesophageal reflux disease)   . Hypertension   . Blind     Right enucleation  . Hearing loss   . Coronary artery disease     Minimal LAD stenosis 2007  . BPH (benign prostatic hypertrophy)   . Hyperlipidemia   . Varicose vein   . Peripheral neuropathy   . Tremor, essential   . Aortic sclerosis     2008  . Aortic stenosis     Mild, echo, January, 2014  . Atrial flutter     December, 2013  . Chronic anticoagulation     Apixaban started December 27, 2011  . Edema     December, 2013  . CHF (congestive heart failure)   . Hearing difficulty of both ears     hearing aids  . Urine retention 01/08/2013  . Depression   . Urinary catheter in place   . Syncope 07/06/2013   Current Outpatient Prescriptions on File Prior to Visit  Medication Sig Dispense Refill  . ciprofloxacin (CIPRO) 250 MG tablet       . Coconut Oil 1000 MG CAPS Take 1,000 mg by mouth 2 (two) times daily.      Marland Kitchen Cod Liver Oil CAPS Take 1 capsule by mouth daily.      . Cyanocobalamin (B-12 PO) Take 1 capsule by mouth daily.      Marland Kitchen  ELDERBERRY PO Take 1 capsule by mouth daily.      Marland Kitchen ELIQUIS 5 MG TABS tablet TAKE ONE TABLET BY MOUTH TWICE DAILY  60 tablet  1  . lisinopril (PRINIVIL,ZESTRIL) 10 MG tablet       . Multiple Vitamin (MULTIVITAMIN WITH MINERALS) TABS tablet Take 1 tablet by mouth daily.      . nitrofurantoin, macrocrystal-monohydrate, (MACROBID) 100 MG capsule Take 1 capsule (100 mg total) by mouth 2 (two) times daily.  20 capsule  0  . potassium chloride SA (K-DUR,KLOR-CON) 20 MEQ tablet Take 1 tablet (20 mEq total) by mouth daily. Along with lasix  30 tablet  0  . torsemide (DEMADEX) 20 MG tablet Take 1 tablet (20 mg total) by mouth 2 (two) times daily.  60 tablet  0   No current facility-administered medications on file prior to  visit.   Allergies  Allergen Reactions  . Menthol Hives   Review of Systems  Constitutional: Negative for fever, chills, diaphoresis, activity change, appetite change, fatigue and unexpected weight change.  Cardiovascular: Positive for leg swelling. Negative for chest pain and palpitations.  Gastrointestinal: Positive for constipation (intermittent).  Genitourinary: Positive for frequency. Negative for dysuria, hematuria, flank pain and decreased urine volume.  Musculoskeletal: Positive for arthralgias and back pain (unchanged). Negative for gait problem.  Skin: Positive for color change.  Neurological: Positive for dizziness, tremors, light-headedness (unchanged) and numbness. Negative for seizures, syncope, speech difficulty and headaches.  Hematological: Bruises/bleeds easily.   Triage Vitals: BP 138/48  Pulse 48  Temp(Src) 97.6 F (36.4 C) (Oral)  Resp 16  Ht 5' 7.5" (1.715 m)  Wt 188 lb 6.4 oz (85.458 kg)  BMI 29.06 kg/m2  SpO2 99%    Objective:   Physical Exam  Nursing note and vitals reviewed. Constitutional: He is oriented to person, place, and time. He appears well-developed and well-nourished.  HENT:  Head: Normocephalic and atraumatic.  Eyes: Conjunctivae and EOM are normal.  Neck: Neck supple.  Cardiovascular: Normal rate and regular rhythm.   Murmur heard.  Systolic murmur is present with a grade of 3/6  L lower sternal border  Pulmonary/Chest: Effort normal and breath sounds normal.  Musculoskeletal: Normal range of motion. He exhibits edema.       Right lower leg: He exhibits edema.       Left lower leg: He exhibits edema.  1+ pitting edema bilaterally with venous stasis hyperpigmentation bilaterally  Neurological: He is alert and oriented to person, place, and time.  Skin: Skin is warm and dry.  Psychiatric: He has a normal mood and affect. His behavior is normal.  Negative orthostatics     Results for orders placed in visit on 12/45/80  BASIC  METABOLIC PANEL      Result Value Ref Range   Sodium 140  135 - 145 mEq/L   Potassium 4.5  3.5 - 5.3 mEq/L   Chloride 104  96 - 112 mEq/L   CO2 26  19 - 32 mEq/L   Glucose, Bld 87  70 - 99 mg/dL   BUN 26 (*) 6 - 23 mg/dL   Creat 1.44 (*) 0.50 - 1.35 mg/dL   Calcium 8.8  8.4 - 10.5 mg/dL  POCT CBC      Result Value Ref Range   WBC 5.2  4.6 - 10.2 K/uL   Lymph, poc 1.8  0.6 - 3.4   POC LYMPH PERCENT 34.6  10 - 50 %L   MID (cbc) 0.4  0 -  0.9   POC MID % 8.3  0 - 12 %M   POC Granulocyte 3.0  2 - 6.9   Granulocyte percent 57.1  37 - 80 %G   RBC 5.03  4.69 - 6.13 M/uL   Hemoglobin 14.9  14.1 - 18.1 g/dL   HCT, POC 45.4  43.5 - 53.7 %   MCV 90.3  80 - 97 fL   MCH, POC 29.6  27 - 31.2 pg   MCHC 32.8  31.8 - 35.4 g/dL   RDW, POC 14.7     Platelet Count, POC 139 (*) 142 - 424 K/uL   MPV 8.1  0 - 99.8 fL    Assessment & Plan:   Thrombocytopenia, unspecified - Plan: POCT CBC, Basic metabolic panel - mild, chronic, and at baseline, no further w/u needed currently  Gastroesophageal reflux disease, esophagitis presence not specified - ok to restart ppi  Essential hypertension - Pt BPs are great but definitely do NOT want any lower considering recent syncopal episode so hold off on restart lisinopril. If needed due to cardiac hx of CAD per cardiology, would rec restarting at 2.5mg  qd only.  Acute renal failure, unspecified acute renal failure type  Aortic stenosis  Atrial flutter, unspecified - on eliquis - f/u w/ cards  Chronic anticoagulation  Edema - Ok to refill Torsemide and Potassium for 3 months if patient needs a refill from [place] in August.  Then will need repeat bmp and bp check for additional refills. I am concerned that pt's renal function has bumped from his baseline slightly - pre-renal again from overuse of diuretics.  Will have pt f/u w/ cardiology in 4d so did not change meds today but if this persists would consider having pt increase water intake some or decrease  torsemide to qd rather than bid.  Cont to check bid weights and call/RTC if wt increases 3 lbs o/n or 5 lbs in 1 wk - right now pt is down to 180-181 from the initial starting weight of 186  Meds ordered this encounter  Medications  . Acetaminophen (TYLENOL ARTHRITIS PAIN PO)    Sig: Take 650 tablets by mouth at bedtime.    I personally performed the services described in this documentation, which was scribed in my presence. The recorded information has been reviewed and considered, and addended by me as needed.  Keith Cheadle, MD MPH

## 2013-07-31 ENCOUNTER — Encounter: Payer: Self-pay | Admitting: Family Medicine

## 2013-07-31 LAB — BASIC METABOLIC PANEL
BUN: 26 mg/dL — ABNORMAL HIGH (ref 6–23)
CHLORIDE: 104 meq/L (ref 96–112)
CO2: 26 meq/L (ref 19–32)
Calcium: 8.8 mg/dL (ref 8.4–10.5)
Creat: 1.44 mg/dL — ABNORMAL HIGH (ref 0.50–1.35)
GLUCOSE: 87 mg/dL (ref 70–99)
Potassium: 4.5 mEq/L (ref 3.5–5.3)
Sodium: 140 mEq/L (ref 135–145)

## 2013-07-31 MED ORDER — OMEPRAZOLE 40 MG PO CPDR: 40.0000 mg | DELAYED_RELEASE_CAPSULE | Freq: Every day | ORAL | Status: AC

## 2013-08-03 ENCOUNTER — Encounter: Payer: Self-pay | Admitting: Physician Assistant

## 2013-08-03 ENCOUNTER — Ambulatory Visit (INDEPENDENT_AMBULATORY_CARE_PROVIDER_SITE_OTHER): Payer: Medicare HMO | Admitting: Physician Assistant

## 2013-08-03 VITALS — BP 156/50 | HR 47 | Ht 67.5 in | Wt 191.1 lb

## 2013-08-03 DIAGNOSIS — N179 Acute kidney failure, unspecified: Secondary | ICD-10-CM

## 2013-08-03 DIAGNOSIS — I350 Nonrheumatic aortic (valve) stenosis: Secondary | ICD-10-CM

## 2013-08-03 DIAGNOSIS — I10 Essential (primary) hypertension: Secondary | ICD-10-CM

## 2013-08-03 DIAGNOSIS — R55 Syncope and collapse: Secondary | ICD-10-CM

## 2013-08-03 DIAGNOSIS — I2510 Atherosclerotic heart disease of native coronary artery without angina pectoris: Secondary | ICD-10-CM

## 2013-08-03 DIAGNOSIS — I359 Nonrheumatic aortic valve disorder, unspecified: Secondary | ICD-10-CM

## 2013-08-03 DIAGNOSIS — I5032 Chronic diastolic (congestive) heart failure: Secondary | ICD-10-CM

## 2013-08-03 DIAGNOSIS — I4892 Unspecified atrial flutter: Secondary | ICD-10-CM

## 2013-08-03 LAB — BASIC METABOLIC PANEL
BUN: 25 mg/dL — AB (ref 6–23)
CALCIUM: 8.6 mg/dL (ref 8.4–10.5)
CO2: 29 meq/L (ref 19–32)
CREATININE: 1.3 mg/dL (ref 0.4–1.5)
Chloride: 106 mEq/L (ref 96–112)
GFR: 56.7 mL/min — ABNORMAL LOW (ref >60.00)
Glucose, Bld: 102 mg/dL — ABNORMAL HIGH (ref 70–99)
Potassium: 3.9 mEq/L (ref 3.5–5.1)
Sodium: 142 mEq/L (ref 135–145)

## 2013-08-03 NOTE — Patient Instructions (Signed)
Your physician recommends that you continue on your current medications as directed. Please refer to the Current Medication list given to you today.  Your physician recommends that you go to the lab today for a BMET  Your physician has recommended that you wear an event monitor. Event monitors are medical devices that record the heart's electrical activity. Doctors most often [place] these monitors to diagnose arrhythmias. Arrhythmias are problems with the speed or rhythm of the heartbeat. The monitor is a small, portable device. You can wear one while you do your normal daily activities. This is usually used to diagnose what is causing palpitations/syncope (passing out).  We suggest that you do not drive until you see Dr Percival Spanish in 4-6 weeks.  Your physician recommends that you schedule a follow-up appointment in: 4-6 Weeks with Dr Percival Spanish

## 2013-08-03 NOTE — Progress Notes (Signed)
Cardiology Office Note    Date:  08/03/2013   ID:  Keith Ellis, DOB 1929-11-12, MRN [id number]  PCP:  [name], MD  Cardiologist:  Dr. Minus Breeding      History of Present Illness: Keith Ellis is a 80 y.o. male history of diastolic CHF, no significant CAD by cardiac catheterization in 2007, HTN, HL, mild aortic stenosis, paroxysmal atrial flutter on anticoagulation with Eliquis.  Last seen by Dr. Percival Spanish 06/26/13. Lisinopril was added given uncontrolled hypertension.  Patient was admitted 6/29-7/2 with syncope in the setting of acute kidney injury. Discharge notes indicate that his orthostatic vital signs were positive. Echocardiogram demonstrated normal LV function. Aortic stenosis remained mild. Creatinine was 2.28 and normalized to 1.0 at the time of discharge.  ACE inhibitor and diuretics were placed on hold. He was also treated with antibiotics for probable UTI. He has been seen back in follow up by primary care.  Diuretics were resumed.  Recent creatinine 7/23 was again elevated at 1.44.  He returns for cardiology follow up.    He is doing well.  Denies chest pain or significant dyspnea.  He is NYHA 2-2b.  Denies orthopnea, PND.  LE edema improved since resuming diuretics.  Weights have been stable.  BPs at home are optimal.  Denies syncope.  He has chronic dizziness that he attributes to peripheral neuropathy that has not changed.  He tells me that he had facial flushing and dizziness prior to both syncopal episodes.  One occurred during urination.  The other occurred while walking out of a gas station.  He denies any significant injuries.  No loss of bowel or bladder function.  No post ictal state.   Studies:  - LHC (2/07):  No significant CAD - Ostial left main 25%, OM luminal irregularities  - Echo (07/07/13):  Mild focal basal hypertrophy of the septum, EF 55-60%, normal wall motion, grade 1 diastolic dysfunction, mild AS (mean 9 mm Hg), MAC, mild LAE, RV, normal RV  function, mild RAE, mild TR, PASP 40 mm Hg   Recent Labs: 07/07/2013: ALT 19  07/30/2013: Creatinine 1.44*; Hemoglobin 14.9; Potassium 4.5   Wt Readings from Last 3 Encounters:  08/03/13 191 lb 1.9 oz (86.691 kg)  07/30/13 188 lb 6.4 oz (85.458 kg)  07/27/13 189 lb 8 oz (85.957 kg)     Past Medical History  Diagnosis Date  . GERD (gastroesophageal reflux disease)   . Hypertension   . Blind     Right enucleation  . Hearing loss   . Coronary artery disease     Minimal LAD stenosis 2007  . BPH (benign prostatic hypertrophy)   . Hyperlipidemia   . Varicose vein   . Peripheral neuropathy   . Tremor, essential   . Aortic sclerosis     2008  . Aortic stenosis     Mild, echo, January, 2014  . Atrial flutter     December, 2013  . Chronic anticoagulation     Apixaban started December 27, 2011  . Edema     December, 2013  . CHF (congestive heart failure)   . Hearing difficulty of both ears     hearing aids  . Urine retention 01/08/2013  . Depression   . Urinary catheter in place   . Syncope 07/06/2013    Current Outpatient Prescriptions  Medication Sig Dispense Refill  . Acetaminophen (TYLENOL ARTHRITIS PAIN PO) Take 650 tablets by mouth at bedtime.      . Coconut Oil 1000  MG CAPS Take 1,000 mg by mouth 2 (two) times daily.      Marland Kitchen Cod Liver Oil CAPS Take 1 capsule by mouth daily.      . Cyanocobalamin (B-12 PO) Take 1 capsule by mouth daily.      Marland Kitchen ELDERBERRY PO Take 1 capsule by mouth daily.      Marland Kitchen ELIQUIS 5 MG TABS tablet TAKE ONE TABLET BY MOUTH TWICE DAILY  60 tablet  1  . Multiple Vitamin (MULTIVITAMIN WITH MINERALS) TABS tablet Take 1 tablet by mouth daily.      Marland Kitchen omeprazole (PRILOSEC) 40 MG capsule Take 1 capsule (40 mg total) by mouth daily.  30 capsule  5  . potassium chloride SA (K-DUR,KLOR-CON) 20 MEQ tablet Take 1 tablet (20 mEq total) by mouth daily. Along with lasix  30 tablet  0  . torsemide (DEMADEX) 20 MG tablet Take 1 tablet (20 mg total) by mouth 2  (two) times daily.  60 tablet  0   No current facility-administered medications for this visit.    Allergies:   Menthol   Social History:  The patient  reports that he quit smoking about 38 years ago. His smoking use included Cigarettes. He smoked 0.00 packs per day. He has never used smokeless tobacco. He reports that he does not drink alcohol or use illicit drugs.   Family History:  The patient's family history includes Cancer in his father, mother, and sister; Rectal cancer in his father. There is no history of Stomach cancer, Esophageal cancer, or Colon cancer.   ROS:  Please see the history of present illness.   He had recent black stools.  These have resolved.  Recent Hgb's normal.   All other systems reviewed and negative.   PHYSICAL EXAM: VS:  BP 156/50  Pulse 47  Ht 5' 7.5" (1.715 m)  Wt 191 lb 1.9 oz (86.691 kg)  BMI 29.47 kg/m2 Well nourished, well developed, in no acute distress HEENT: normal Neck: no JVD Cardiac:  normal S1, S2; RRR; 1/6 systolic murmurRUSB Lungs:  clear to auscultation bilaterally, no wheezing, rhonchi or rales Abd: soft, nontender, no hepatomegaly Ext: 1+ bilateral LE edema Skin: warm and dry Neuro:  CNs 2-12 intact, no focal abnormalities noted  EKG:  Sinus brady, HR, 47, 1st degree AVB (PR 322), RBBB     ASSESSMENT AND PLAN:  Syncope, unspecified syncope type:  Episodes of syncope seem most consistent with postural blood pressure drop. However, he does have significant evidence of conduction system disease on his ECG. He has a very long first degree AV block as well as right bundle branch block. His heart rate is 47. He does not take any AV nodal blocking agents.  I have suggested placing him on an event monitor to rule out significant bradyarrhythmia. I have suggested that he not drive until seen in follow up.  Chronic diastolic heart failure:  Volume appears stable. Check followup basic metabolic panel today.  Coronary artery disease:  No  angina. He is not on aspirin as he is on Eliquis.  Essential hypertension:  Blood pressures at home have been optimal.  Atrial flutter, unspecified:  Maintaining NSR. He is tolerating Eliquis. Weight is greater than 60 kg and creatinine has traditionally been less than 1.5. Continue current dose of 5 mg twice a day.  Aortic stenosis:  Mild by recent echocardiogram.  AKI (acute kidney injury):  Previously resolved. Recent creatinine elevated. Repeat basic metabolic panel today. Adjust diuretics as necessary.  Disposition:  F/u with Dr. Minus Breeding in 4-6 weeks after his monitor.    Signed, Versie Starks, MHS 08/03/2013 10:25 AM    [facility] [address], [place], [place]  [address] Phone: [phone]; Fax: [phone]

## 2013-08-04 ENCOUNTER — Telehealth: Payer: Self-pay | Admitting: *Deleted

## 2013-08-04 NOTE — Telephone Encounter (Signed)
pt notified about lab results with verbal understanding, verified his appt for 7/29 for monitor as well.

## 2013-08-05 ENCOUNTER — Encounter: Payer: Self-pay | Admitting: Radiology

## 2013-08-05 ENCOUNTER — Encounter (INDEPENDENT_AMBULATORY_CARE_PROVIDER_SITE_OTHER): Payer: Medicare HMO

## 2013-08-05 DIAGNOSIS — R55 Syncope and collapse: Secondary | ICD-10-CM

## 2013-08-05 NOTE — Progress Notes (Signed)
Patient ID: Keith Ellis, male   DOB: 1929/09/15, 80 y.o.   MRN: [id number] E Cardio 30 day monitor applied. EOS 09-04-13

## 2013-08-27 ENCOUNTER — Encounter: Payer: Self-pay | Admitting: Cardiology

## 2013-08-27 ENCOUNTER — Ambulatory Visit (INDEPENDENT_AMBULATORY_CARE_PROVIDER_SITE_OTHER): Payer: Medicare HMO | Admitting: Cardiology

## 2013-08-27 VITALS — BP 174/60 | HR 48 | Ht 67.0 in | Wt 196.2 lb

## 2013-08-27 DIAGNOSIS — I10 Essential (primary) hypertension: Secondary | ICD-10-CM

## 2013-08-27 NOTE — Progress Notes (Signed)
HPI The patient presents for followup of diastolic HF and HTN     At the last appointment I did  At an ACE inhibitor because of persistent hypertension. However, soon after that he had some dehydration and presented with renal insufficiency. He has had left-sided was thought to be related to dehydration. His creatinine has come to baseline. He is off ACE inhibitor but is back on low-dose diuretic.  He did come back and see Richardson Dopp and had an event monitor placed because of the syncope and bradycardia arrhythmias.   He did appear to have one episode of atrial flutter was somewhat conduction. He otherwise has sinus bradycardia with first-degree block. There were no significant pauses. I did review of these strips today.   Allergies  Allergen Reactions  . Menthol Hives    Current Outpatient Prescriptions  Medication Sig Dispense Refill  . Coconut Oil 1000 MG CAPS Take 1,000 mg by mouth 2 (two) times daily.      Marland Kitchen Cod Liver Oil CAPS Take 1 capsule by mouth daily.      . Cyanocobalamin (B-12 PO) Take 1 capsule by mouth daily.      Marland Kitchen ELDERBERRY PO Take 1 capsule by mouth daily.      Marland Kitchen ELIQUIS 5 MG TABS tablet TAKE ONE TABLET BY MOUTH TWICE DAILY  60 tablet  1  . Multiple Vitamin (MULTIVITAMIN WITH MINERALS) TABS tablet Take 1 tablet by mouth daily.      Marland Kitchen omeprazole (PRILOSEC) 40 MG capsule Take 1 capsule (40 mg total) by mouth daily.  30 capsule  5  . potassium chloride SA (K-DUR,KLOR-CON) 20 MEQ tablet Take 1 tablet (20 mEq total) by mouth daily. Along with lasix  30 tablet  0  . torsemide (DEMADEX) 20 MG tablet Take 1 tablet (20 mg total) by mouth 2 (two) times daily.  60 tablet  0   No current facility-administered medications for this visit.    Past Medical History  Diagnosis Date  . GERD (gastroesophageal reflux disease)   . Hypertension   . Blind     Right enucleation  . Hearing loss   . Coronary artery disease     Minimal LAD stenosis 2007  . BPH (benign prostatic  hypertrophy)   . Hyperlipidemia   . Varicose vein   . Peripheral neuropathy   . Tremor, essential   . Aortic sclerosis     2008  . Aortic stenosis     Mild, echo, January, 2014  . Atrial flutter     December, 2013  . Chronic anticoagulation     Apixaban started December 27, 2011  . Edema     December, 2013  . CHF (congestive heart failure)   . Hearing difficulty of both ears     hearing aids  . Urine retention 01/08/2013  . Depression   . Urinary catheter in place   . Syncope 07/06/2013    Past Surgical History  Procedure Laterality Date  . Lung surgery      Benign lump removed  . Tumor excision      behindleft eye,abpve left eye,left foot  . Esophagogastroduodenoscopy N/A 06/23/2012    Procedure: ESOPHAGOGASTRODUODENOSCOPY (EGD);  Surgeon: Ladene Artist, MD;  Location: [facility];  Service: Endoscopy;  Laterality: N/A;  . Upper gastrointestinal endoscopy    . Cataract extraction Right   . Eye surgery Left     removal eyeball  . Transurethral resection of prostate N/A 03/11/2013    Procedure: TRANSURETHRAL  RESECTION OF THE PROSTATE WITH GYRUS INSTRUMENTS;  Surgeon: Alexis Frock, MD;  Location: [facility];  Service: Urology;  Laterality: N/A;   ROS:  As stated in the HPI and negative for all other systems.  PHYSICAL EXAM BP 174/60  Pulse 48  Ht 5\' 7"  (1.702 m)  Wt 196 lb 3.2 oz (88.996 kg)  BMI 30.72 kg/m2 GENERAL:  Well appearing NECK:  Jugular venous distention to the jaw at 45, waveform within normal limits, carotid upstroke brisk and symmetric, no bruits, no thyromegaly LYMPHATICS:  No cervical, inguinal adenopathy LUNGS:  Clear to auscultation bilaterally BACK:  No CVA tenderness CHEST:  Unremarkable HEART:  PMI not displaced or sustained,S1 and S2 within normal limits, no S3, no S4,  no clicks, no rubs, harsh systolic murmur radiating out the outflow tract and mid to late peaking, no diastolic murmurs ABD:  Flat, positive bowel sounds normal in frequency in  pitch, positive abdominal bruits, no rebound, no guarding, positive midline pulsatile mass, positive  hepatomegaly, no splenomegaly, obese EXT:  2 plus pulses throughout,  Momoderate bilateral edema, no cyanosis no clubbing, venous stasis changes SKIN:  No rashes no nodules, mild bruising  EKG:  Sinus bradycardia, rate 49, first degree AV block, right bundle branch block, no acute ST-T wave changes. 08/27/2013  ASSESSMENT AND PLAN  CKD - His creatinine has returned to baseline.  He did not tolerate ACE inhibitors and I will avoid this and ARB  Syncope - He has had no further episodes.   He can take up event monitor today.  Atrial flutter - He is in sinus rhythm now. He's tolerating anticoagulation. He will remain on the meds as listed  Aortic sclerosis -  He has very mild aortic stenosis. No change in therapy is indicated.  Hypertension -  His blood pressure is elevated today and I reviewed his blood pressure diary and it was excellent. Therefore, no change in therapy is indicated.    Coronary artery disease -  He has no active symptoms. No change in therapy is indicated.  Pulmonary hypertension - The pulmonary hypertension is mild to moderate on the recent echo. . No change in therapy is indicated.

## 2013-08-27 NOTE — Patient Instructions (Signed)
Your physician recommends that you schedule a follow-up appointment in:  4 months with Dr. Percival Spanish

## 2013-09-14 ENCOUNTER — Ambulatory Visit (INDEPENDENT_AMBULATORY_CARE_PROVIDER_SITE_OTHER): Payer: Medicare HMO | Admitting: Family Medicine

## 2013-09-14 ENCOUNTER — Ambulatory Visit (INDEPENDENT_AMBULATORY_CARE_PROVIDER_SITE_OTHER): Payer: Medicare HMO

## 2013-09-14 VITALS — BP 148/40 | HR 52 | Temp 97.9°F | Resp 16 | Ht 67.5 in | Wt 197.2 lb

## 2013-09-14 DIAGNOSIS — M109 Gout, unspecified: Secondary | ICD-10-CM

## 2013-09-14 DIAGNOSIS — M25532 Pain in left wrist: Secondary | ICD-10-CM

## 2013-09-14 DIAGNOSIS — M25539 Pain in unspecified wrist: Secondary | ICD-10-CM

## 2013-09-14 LAB — COMPREHENSIVE METABOLIC PANEL
ALT: <8 U/L (ref 0–53)
AST: 13 U/L (ref 0–37)
Albumin: 3.9 g/dL (ref 3.5–5.2)
Alkaline Phosphatase: 60 U/L (ref 39–117)
BUN: 21 mg/dL (ref 6–23)
CO2: 31 mEq/L (ref 19–32)
Calcium: 9 mg/dL (ref 8.4–10.5)
Chloride: 102 mEq/L (ref 96–112)
Creat: 1.27 mg/dL (ref 0.50–1.35)
Glucose, Bld: 108 mg/dL — ABNORMAL HIGH (ref 70–99)
Potassium: 3.9 mEq/L (ref 3.5–5.3)
Sodium: 140 mEq/L (ref 135–145)
Total Bilirubin: 3.8 mg/dL — ABNORMAL HIGH (ref 0.2–1.2)
Total Protein: 6.3 g/dL (ref 6.0–8.3)

## 2013-09-14 LAB — POCT CBC
Granulocyte percent: 73.1 %G (ref 37–80)
HCT, POC: 42.5 % — AB (ref 43.5–53.7)
Hemoglobin: 14 g/dL — AB (ref 14.1–18.1)
Lymph, poc: 1.6 (ref 0.6–3.4)
MCH, POC: 30.2 pg (ref 27–31.2)
MCHC: 33 g/dL (ref 31.8–35.4)
MCV: 91.5 fL (ref 80–97)
MID (cbc): 0.8 (ref 0–0.9)
MPV: 8.3 fL (ref 0–99.8)
POC Granulocyte: 6.5 (ref 2–6.9)
POC LYMPH PERCENT: 17.5 %L (ref 10–50)
POC MID %: 9.4 %M (ref 0–12)
Platelet Count, POC: 115 10*3/uL — AB (ref 142–424)
RBC: 4.65 M/uL — AB (ref 4.69–6.13)
RDW, POC: 15.1 %
WBC: 8.9 10*3/uL (ref 4.6–10.2)

## 2013-09-14 LAB — POCT SEDIMENTATION RATE: POCT SED RATE: 24 mm/hr — AB (ref 0–22)

## 2013-09-14 LAB — URIC ACID: Uric Acid, Serum: 10.9 mg/dL — ABNORMAL HIGH (ref 4.0–7.8)

## 2013-09-14 MED ORDER — HYDROCODONE-ACETAMINOPHEN 5-325 MG PO TABS: 1.0000 | ORAL_TABLET | Freq: Four times a day (QID) | ORAL | Status: DC | PRN

## 2013-09-14 MED ORDER — PREDNISONE 20 MG PO TABS: 40.0000 mg | ORAL_TABLET | Freq: Every day | ORAL | Status: DC

## 2013-09-14 NOTE — Patient Instructions (Signed)

## 2013-09-14 NOTE — Progress Notes (Signed)
This is an 80 year old man who comes in with left wrist pain and swelling of the last 3 days. It kept him up at night last night. He has a history of gout.  Patient has had problems with syncope in the past but after hospitalization and careful monitoring, many medicines were elimination it and he has not had syncope since. He's been followed by Dr. Genella Mech for this.  Patient's blood pressure and weight have been stable over the last month. These were reviewed during the visit.  Patient's denies chest pain or shortness of breath. His edema is less continues to have runny postphlebitic changes in both legs. He continues to take blood thinners and diuretics.  Objective: No acute distress, patient blind in his left eye and uses hearing aids. He is accompanied by his wife today.  HEENT: No significant change from last visit. Neck: Supple no adenopathy or JVD Chest: Clear Heart: Irregularly irregular with a once over 6 systolic murmur Extremities: Brawny induration of both legs with no skin breaks. His left wrist is moderately swollen and tender with warmth and erythema over the joint line diffusely. His fingers are mildly swollen as well.  [facility] reading (PRIMARY) by  Dr. Joseph Art:  Left wrist-->. Several hypodense cystlike areas in the lunate, navicular, greater multangular, and lesser multangular. No cortical or articular erosions.   Past uric acid was elevated at 8.2  Results for orders placed in visit on 09/14/13  POCT CBC      Result Value Ref Range   WBC 8.9  4.6 - 10.2 K/uL   Lymph, poc 1.6  0.6 - 3.4   POC LYMPH PERCENT 17.5  10 - 50 %L   MID (cbc) 0.8  0 - 0.9   POC MID % 9.4  0 - 12 %M   POC Granulocyte 6.5  2 - 6.9   Granulocyte percent 73.1  37 - 80 %G   RBC 4.65 (*) 4.69 - 6.13 M/uL   Hemoglobin 14.0 (*) 14.1 - 18.1 g/dL   HCT, POC 42.5 (*) 43.5 - 53.7 %   MCV 91.5  80 - 97 fL   MCH, POC 30.2  27 - 31.2 pg   MCHC 33.0  31.8 - 35.4 g/dL   RDW, POC 15.1     Platelet  Count, POC 115 (*) 142 - 424 K/uL   MPV 8.3  0 - 99.8 fL   Assessment: Most likely explanation for the left wrist pain is gout.  Plan:  Wrist pain, acute, left - Plan: Comprehensive metabolic panel, POCT CBC, Uric Acid, Sedimentation rate, Sedimentation rate, DG Wrist Complete Left, HYDROcodone-acetaminophen (NORCO) 5-325 MG per tablet, predniSONE (DELTASONE) 20 MG tablet  Acute gout of left wrist, unspecified cause - Plan: predniSONE (DELTASONE) 20 MG tablet  [name], MD

## 2013-09-14 NOTE — Addendum Note (Signed)
Addended by: Kem Boroughs D on: 09/14/2013 02:10 PM   Modules accepted: Orders

## 2013-10-16 ENCOUNTER — Telehealth: Payer: Self-pay | Admitting: *Deleted

## 2013-10-16 NOTE — Telephone Encounter (Signed)
Entered in error

## 2013-10-29 ENCOUNTER — Other Ambulatory Visit: Payer: Self-pay | Admitting: Family Medicine

## 2013-10-29 NOTE — Telephone Encounter (Signed)
At 07/22/13 OV your note inst's to restart his demadex. Do you want to give pt RFs or RTC?

## 2013-11-11 ENCOUNTER — Telehealth: Payer: Self-pay | Admitting: Cardiology

## 2013-11-11 NOTE — Telephone Encounter (Signed)
The patient has a history of a-flutter. Will forward to Dr. Percival Spanish for recommendations.

## 2013-11-11 NOTE — Telephone Encounter (Signed)
Pt need to have his teeth extracted. He need to know what tto do about his Eliquis.

## 2013-11-12 NOTE — Telephone Encounter (Signed)
Spoke with patient and he is aware of Dr [employer] recomendations

## 2013-11-12 NOTE — Telephone Encounter (Signed)
He can hold his Eliquis for 2 days prior to procedure and then resume when okay with an oral surgeon

## 2013-12-15 ENCOUNTER — Telehealth: Payer: Self-pay | Admitting: Family Medicine

## 2013-12-15 NOTE — Telephone Encounter (Signed)
Left a message on the machine for patient to come into clinic or call us if he has had his flu shot.

## 2013-12-21 ENCOUNTER — Ambulatory Visit: Payer: Medicare HMO | Admitting: Adult Health

## 2013-12-24 ENCOUNTER — Encounter: Payer: Self-pay | Admitting: Family Medicine

## 2013-12-24 ENCOUNTER — Ambulatory Visit (INDEPENDENT_AMBULATORY_CARE_PROVIDER_SITE_OTHER): Payer: Medicare HMO | Admitting: Family Medicine

## 2013-12-24 VITALS — BP 146/66 | HR 54 | Temp 97.9°F | Resp 16 | Ht 66.5 in | Wt 200.0 lb

## 2013-12-24 DIAGNOSIS — M10032 Idiopathic gout, left wrist: Secondary | ICD-10-CM

## 2013-12-24 DIAGNOSIS — M109 Gout, unspecified: Secondary | ICD-10-CM

## 2013-12-24 DIAGNOSIS — M25532 Pain in left wrist: Secondary | ICD-10-CM

## 2013-12-24 LAB — SEDIMENTATION RATE: Sed Rate: 5 mm/hr (ref 0–16)

## 2013-12-24 LAB — URIC ACID: Uric Acid, Serum: 12 mg/dL — ABNORMAL HIGH (ref 4.0–7.8)

## 2013-12-24 MED ORDER — ACETAMINOPHEN-CODEINE #4 300-60 MG PO TABS: 1.0000 | ORAL_TABLET | ORAL | Status: DC | PRN

## 2013-12-24 MED ORDER — ALLOPURINOL 100 MG PO TABS: 100.0000 mg | ORAL_TABLET | Freq: Every day | ORAL | Status: DC

## 2013-12-24 MED ORDER — PREDNISONE 20 MG PO TABS: 40.0000 mg | ORAL_TABLET | Freq: Every day | ORAL | Status: DC

## 2013-12-24 NOTE — Progress Notes (Signed)
80 yo man with recurrent wrist pain on right and swollen painful digits on that side.  Also the foot is painful on the right  He has been thought to have had gout in September, and did well for a week or so with prednisone.  The pain continues to worsen over the past two months and he is unable to button clothes or even grasp toothbrush  Objective: tender, swollen right wrist and all digits on right hand Skin erythematous overlying joints.  This chart was scribed in my presence and reviewed by me personally.    ICD-9-CM ICD-10-CM   1. Wrist pain, acute, left 719.43 M25.532 predniSONE (DELTASONE) 20 MG tablet     acetaminophen-codeine (TYLENOL #4) 300-60 MG per tablet  2. Acute gout of left wrist, unspecified cause 274.01 M10.032 predniSONE (DELTASONE) 20 MG tablet     allopurinol (ZYLOPRIM) 100 MG tablet     Uric Acid     Sedimentation Rate     Signed, [name], MD

## 2013-12-24 NOTE — Patient Instructions (Signed)

## 2013-12-28 ENCOUNTER — Ambulatory Visit (INDEPENDENT_AMBULATORY_CARE_PROVIDER_SITE_OTHER): Payer: Medicare HMO | Admitting: Cardiology

## 2013-12-28 ENCOUNTER — Encounter: Payer: Self-pay | Admitting: Cardiology

## 2013-12-28 VITALS — BP 120/60 | HR 53 | Ht 66.0 in | Wt 200.0 lb

## 2013-12-28 DIAGNOSIS — I2510 Atherosclerotic heart disease of native coronary artery without angina pectoris: Secondary | ICD-10-CM

## 2013-12-28 DIAGNOSIS — I4892 Unspecified atrial flutter: Secondary | ICD-10-CM

## 2013-12-28 NOTE — Progress Notes (Signed)
HPI The patient presents for followup of diastolic HF, atrial flutter, syncope,  pulmonary HTN and HTN    Since he was last seen he has had no new complaints. He does get some dyspnea with exertion but this seems to be baseline. He's had some rare palpitations. However, he's not having any PND or orthopnea. He's not having any presyncope or syncope. His weight has crept up a little bit. However, his edema is at baseline. The patient denies any new symptoms such as chest discomfort, neck or arm discomfort.    Allergies  Allergen Reactions  . Menthol Hives    Current Outpatient Prescriptions  Medication Sig Dispense Refill  . acetaminophen-codeine (TYLENOL #4) 300-60 MG per tablet Take 1 tablet by mouth every 4 (four) hours as needed for moderate pain. 1/2 to 1 every 6 hours as needed for pain 30 tablet 0  . allopurinol (ZYLOPRIM) 100 MG tablet Take 1 tablet (100 mg total) by mouth daily. 30 tablet 6  . Coconut Oil 1000 MG CAPS Take 1,000 mg by mouth 2 (two) times daily.    Marland Kitchen Cod Liver Oil CAPS Take 1 capsule by mouth daily.    . Cyanocobalamin (B-12 PO) Take 1 capsule by mouth daily.    Marland Kitchen ELDERBERRY PO Take 1 capsule by mouth daily.    Marland Kitchen ELIQUIS 5 MG TABS tablet TAKE ONE TABLET BY MOUTH TWICE DAILY 60 tablet 1  . Multiple Vitamin (MULTIVITAMIN WITH MINERALS) TABS tablet Take 1 tablet by mouth daily.    Marland Kitchen omeprazole (PRILOSEC) 40 MG capsule Take 1 capsule (40 mg total) by mouth daily. 30 capsule 5  . potassium chloride SA (K-DUR,KLOR-CON) 20 MEQ tablet Take 1 tablet (20 mEq total) by mouth daily. Along with lasix 30 tablet 0  . predniSONE (DELTASONE) 20 MG tablet Take 2 tablets (40 mg total) by mouth daily. 10 tablet 3  . torsemide (DEMADEX) 20 MG tablet TAKE ONE TABLET BY MOUTH TWICE DAILY 60 tablet 1   No current facility-administered medications for this visit.    Past Medical History  Diagnosis Date  . GERD (gastroesophageal reflux disease)   . Hypertension   . Blind     Right  enucleation  . Hearing loss   . Coronary artery disease     Minimal LAD stenosis 2007  . BPH (benign prostatic hypertrophy)   . Hyperlipidemia   . Varicose vein   . Peripheral neuropathy   . Tremor, essential   . Aortic sclerosis     2008  . Aortic stenosis     Mild, echo, January, 2014  . Atrial flutter     December, 2013  . Chronic anticoagulation     Apixaban started December 27, 2011  . Edema     December, 2013  . CHF (congestive heart failure)   . Hearing difficulty of both ears     hearing aids  . Urine retention 01/08/2013  . Depression   . Urinary catheter in place   . Syncope 07/06/2013    Past Surgical History  Procedure Laterality Date  . Lung surgery      Benign lump removed  . Tumor excision      behindleft eye,abpve left eye,left foot  . Esophagogastroduodenoscopy N/A 06/23/2012    Procedure: ESOPHAGOGASTRODUODENOSCOPY (EGD);  Surgeon: Ladene Artist, MD;  Location: [facility];  Service: Endoscopy;  Laterality: N/A;  . Upper gastrointestinal endoscopy    . Cataract extraction Right   . Eye surgery Left  removal eyeball  . Transurethral resection of prostate N/A 03/11/2013    Procedure: TRANSURETHRAL RESECTION OF THE PROSTATE WITH GYRUS INSTRUMENTS;  Surgeon: Alexis Frock, MD;  Location: [facility];  Service: Urology;  Laterality: N/A;   ROS:  As stated in the HPI and negative for all other systems.  PHYSICAL EXAM BP 120/60 mmHg  Pulse 53  Ht 5\' 6"  (1.676 m)  Wt 200 lb (90.719 kg)  BMI 32.30 kg/m2 GENERAL:  Well appearing NECK:  Jugular venous distention to the jaw at 45, waveform within normal limits, carotid upstroke brisk and symmetric, no bruits, no thyromegaly LYMPHATICS:  No cervical, inguinal adenopathy LUNGS:  Clear to auscultation bilaterally BACK:  No CVA tenderness CHEST:  Unremarkable HEART:  PMI not displaced or sustained,S1 and S2 within normal limits, no S3, no S4,  no clicks, no rubs, harsh systolic murmur radiating out the  outflow tract and mid to late peaking, no diastolic murmurs ABD:  Flat, positive bowel sounds normal in frequency in pitch, positive abdominal bruits, no rebound, no guarding, positive midline pulsatile mass, positive  hepatomegaly, no splenomegaly, obese EXT:  2 plus pulses throughout,  Moderate bilateral edema, no cyanosis no clubbing, venous stasis changes SKIN:  No rashes no nodules.    EKG:  Sinus bradycardia, rate 53, first degree AV block, right bundle branch block, no acute ST-T wave changes. 12/28/2013  ASSESSMENT AND PLAN  CKD - His creatinine was 1.27 at the last blood draw.  He did not tolerate ACE inhibitors and I will avoid this and ARB  Syncope - He has had no further episodes.     Atrial flutter - He is in sinus rhythm. He's tolerating anticoagulation. He will remain on the meds as listed  Aortic sclerosis -  He has very mild aortic stenosis. No change in therapy is indicated.  Hypertension -  The blood pressure is at target. No change in medications is indicated. We will continue with therapeutic lifestyle changes (TLC).  Coronary artery disease -  He has no active symptoms. No change in therapy is indicated.  Pulmonary hypertension - The pulmonary hypertension is mild to moderate on the recent echo.  He has dyspnea that is unchanged and his exam is unchanged.  No change in therapy is indicated.  I do plan to repeat an EKG in June of next year.  We talked about salt and fluid restriction.

## 2013-12-28 NOTE — Patient Instructions (Addendum)
Your physician recommends that you schedule a follow-up appointment in: June 2016 and we get an Echo when come in June

## 2014-01-02 ENCOUNTER — Other Ambulatory Visit: Payer: Self-pay | Admitting: Family Medicine

## 2014-01-04 ENCOUNTER — Telehealth (HOSPITAL_COMMUNITY): Payer: Self-pay | Admitting: *Deleted

## 2014-01-13 ENCOUNTER — Telehealth: Payer: Self-pay

## 2014-01-13 DIAGNOSIS — M1000 Idiopathic gout, unspecified site: Secondary | ICD-10-CM

## 2014-01-13 NOTE — Telephone Encounter (Signed)
Pt is needing to talk with someone about the hairloss  Side effect of one of his medications

## 2014-01-14 MED ORDER — FEBUXOSTAT 40 MG PO TABS: 40.0000 mg | ORAL_TABLET | Freq: Every day | ORAL | Status: DC

## 2014-01-14 NOTE — Telephone Encounter (Signed)
Left VM to call back 

## 2014-01-14 NOTE — Telephone Encounter (Signed)
Stop the allopurinol.  I will change the medicine to Uloric 40 mg daily and patient can get coupon online.

## 2014-01-14 NOTE — Telephone Encounter (Signed)
Spoke to pt. Told him about the medication switch and that he can get a coupon online. He will have his daughter in law help with the coupon.

## 2014-01-14 NOTE — Telephone Encounter (Signed)
Pt reports that the allopurinol Rx that he started taking for gout after 12/24/13 OV seems to be causing him a lot of hair loss. He has really noticed a problem with it a week or two ago and is getting very concerned. He stated that the packaging advised that this could be a SE. Pt reports that his gout is doing better now, but does not want to continue losing hair.

## 2014-01-15 ENCOUNTER — Telehealth: Payer: Self-pay

## 2014-01-15 NOTE — Telephone Encounter (Signed)
PA needed for Uloric. Pt was taken off of allopurinol d/t SE of hair loss. Completed covermymed. Pending.

## 2014-01-18 ENCOUNTER — Telehealth: Payer: Self-pay | Admitting: *Deleted

## 2014-01-18 NOTE — Telephone Encounter (Signed)
Per patient he does not get the flu shot.

## 2014-01-19 NOTE — Telephone Encounter (Signed)
Patient had side effects with allopurinol and colchicine.

## 2014-01-19 NOTE — Telephone Encounter (Signed)
[employer] faxed add'l question about coverage for Uloric. They want to know if any of the following preferred alternatives be effective for pt, or be likely to have adverse effects for pt: Colcrys, Probenecid/colchicine, or colchicine tablet or capsule. Dr L, please advise.

## 2014-01-19 NOTE — Telephone Encounter (Signed)
Faxed in the info below to ins as req'd. PA Pending.

## 2014-01-21 NOTE — Telephone Encounter (Signed)
PA approved. Pharm notified.

## 2014-01-23 ENCOUNTER — Ambulatory Visit (INDEPENDENT_AMBULATORY_CARE_PROVIDER_SITE_OTHER): Payer: Medicare HMO | Admitting: Family Medicine

## 2014-01-23 ENCOUNTER — Telehealth: Payer: Self-pay

## 2014-01-23 VITALS — BP 168/52 | HR 51 | Temp 98.2°F | Resp 20 | Ht 66.0 in | Wt 199.1 lb

## 2014-01-23 DIAGNOSIS — G4700 Insomnia, unspecified: Secondary | ICD-10-CM

## 2014-01-23 DIAGNOSIS — L570 Actinic keratosis: Secondary | ICD-10-CM

## 2014-01-23 DIAGNOSIS — M1000 Idiopathic gout, unspecified site: Secondary | ICD-10-CM

## 2014-01-23 MED ORDER — LORAZEPAM 1 MG PO TABS: 1.0000 mg | ORAL_TABLET | Freq: Every day | ORAL | Status: DC

## 2014-01-23 NOTE — Telephone Encounter (Signed)
Patient took his xanax about two hours ago, and wants to know if he can take another one now.  His nerves are pretty bad.   [phone]

## 2014-01-23 NOTE — Patient Instructions (Signed)
Please return in 6 weeks to recheck the skin

## 2014-01-23 NOTE — Progress Notes (Signed)
° °  Subjective:    Patient ID: Keith Ellis, male    DOB: 09-01-1929, 81 y.o.   MRN: [id number] This chart was scribed for Keith Haber, MD by Marti Sleigh, Medical Scribe. This patient was seen in Room 11 and the patient's care was started a 4:01 PM.  Chief Complaint  Patient presents with   Medication Refill    Alprazolam    HPI HPI Comments: Keith Ellis is a 81 y.o. male with a hx of HTN, CAD, and GERD who presents to [facility] complaining of reporting for a medication refill.      Review of Systems  Constitutional: Negative for fever and chills.  Musculoskeletal: Positive for joint swelling and arthralgias.  Skin: Positive for rash.       Objective:   Physical Exam  Constitutional: He is oriented to person, place, and time. He appears well-developed and well-nourished.  HENT:  Head: Normocephalic and atraumatic.  Eyes: Pupils are equal, round, and reactive to light.  Neck: Neck supple.  Cardiovascular: Normal rate and regular rhythm.   Pulmonary/Chest: Effort normal and breath sounds normal. No respiratory distress.  Abdominal: Soft.  Neurological: He is alert and oriented to person, place, and time.  Skin: Skin is warm and dry.  Psychiatric: He has a normal mood and affect. His behavior is normal.  Nursing note and vitals reviewed.  Right wrist minimally tender at the ulnar styloid Patient has a patchy area of actinic keratosis behind the right ear. The right cheek looks markedly improved with 4 mm black eschar still adherent.  We discussed insomnia    Assessment & Plan:   This chart was scribed in my presence and reviewed by me personally.    ICD-9-CM ICD-10-CM   1. Insomnia 780.52 G47.00 LORazepam (ATIVAN) 1 MG tablet  2. Actinic keratosis 702.0 L57.0   3. Idiopathic gout, unspecified chronicity, unspecified site 274.9 M10.00      Signed, Keith Haber, MD

## 2014-01-23 NOTE — Telephone Encounter (Signed)
We haven't prescribed this in 2 yrs. He was taking that medicine from 2 years ago. Advised that it was probably expired and to not take anymore, that he should RTC to see Dr. Carlean Jews. He will be here tomorrow.

## 2014-01-27 ENCOUNTER — Ambulatory Visit: Payer: Medicare HMO | Admitting: Adult Health

## 2014-01-27 ENCOUNTER — Telehealth: Payer: Self-pay | Admitting: Adult Health

## 2014-01-27 NOTE — Telephone Encounter (Signed)
Patient no showed for a revisit appointment.  

## 2014-02-02 ENCOUNTER — Encounter (HOSPITAL_COMMUNITY): Payer: Self-pay | Admitting: *Deleted

## 2014-02-02 ENCOUNTER — Emergency Department (HOSPITAL_COMMUNITY)
Admission: EM | Admit: 2014-02-02 | Discharge: 2014-02-03 | Disposition: A | Discharge status: Home / Self Care | Payer: Medicare HMO | Attending: Emergency Medicine | Admitting: Emergency Medicine

## 2014-02-02 ENCOUNTER — Emergency Department (HOSPITAL_COMMUNITY): Payer: Medicare HMO

## 2014-02-02 DIAGNOSIS — I2510 Atherosclerotic heart disease of native coronary artery without angina pectoris: Secondary | ICD-10-CM | POA: Insufficient documentation

## 2014-02-02 DIAGNOSIS — H9193 Unspecified hearing loss, bilateral: Secondary | ICD-10-CM | POA: Diagnosis not present

## 2014-02-02 DIAGNOSIS — Z87891 Personal history of nicotine dependence: Secondary | ICD-10-CM | POA: Insufficient documentation

## 2014-02-02 DIAGNOSIS — Z7952 Long term (current) use of systemic steroids: Secondary | ICD-10-CM | POA: Diagnosis not present

## 2014-02-02 DIAGNOSIS — H540 Blindness, both eyes: Secondary | ICD-10-CM | POA: Diagnosis not present

## 2014-02-02 DIAGNOSIS — I10 Essential (primary) hypertension: Secondary | ICD-10-CM | POA: Diagnosis not present

## 2014-02-02 DIAGNOSIS — Z87438 Personal history of other diseases of male genital organs: Secondary | ICD-10-CM | POA: Diagnosis not present

## 2014-02-02 DIAGNOSIS — Z7901 Long term (current) use of anticoagulants: Secondary | ICD-10-CM | POA: Insufficient documentation

## 2014-02-02 DIAGNOSIS — Z79899 Other long term (current) drug therapy: Secondary | ICD-10-CM | POA: Diagnosis not present

## 2014-02-02 DIAGNOSIS — I509 Heart failure, unspecified: Secondary | ICD-10-CM | POA: Insufficient documentation

## 2014-02-02 DIAGNOSIS — F419 Anxiety disorder, unspecified: Secondary | ICD-10-CM | POA: Diagnosis not present

## 2014-02-02 DIAGNOSIS — K219 Gastro-esophageal reflux disease without esophagitis: Secondary | ICD-10-CM | POA: Insufficient documentation

## 2014-02-02 DIAGNOSIS — R4182 Altered mental status, unspecified: Secondary | ICD-10-CM

## 2014-02-02 DIAGNOSIS — Z8639 Personal history of other endocrine, nutritional and metabolic disease: Secondary | ICD-10-CM | POA: Diagnosis not present

## 2014-02-02 DIAGNOSIS — R42 Dizziness and giddiness: Secondary | ICD-10-CM | POA: Diagnosis present

## 2014-02-02 LAB — DIFFERENTIAL
BASOS ABS: 0 10*3/uL (ref 0.0–0.1)
BASOS PCT: 0 % (ref 0–1)
EOS ABS: 0.1 10*3/uL (ref 0.0–0.7)
EOS PCT: 2 % (ref 0–5)
LYMPHS PCT: 24 % (ref 12–46)
Lymphs Abs: 1.2 10*3/uL (ref 0.7–4.0)
MONO ABS: 0.4 10*3/uL (ref 0.1–1.0)
MONOS PCT: 7 % (ref 3–12)
Neutro Abs: 3.5 10*3/uL (ref 1.7–7.7)
Neutrophils Relative %: 68 % (ref 43–77)

## 2014-02-02 LAB — CBC
HCT: 41.1 % (ref 39.0–52.0)
HEMOGLOBIN: 13.6 g/dL (ref 13.0–17.0)
MCH: 28.2 pg (ref 26.0–34.0)
MCHC: 33.1 g/dL (ref 30.0–36.0)
MCV: 85.1 fL (ref 78.0–100.0)
PLATELETS: 103 10*3/uL — AB (ref 150–400)
RBC: 4.83 MIL/uL (ref 4.22–5.81)
RDW: 15.7 % — AB (ref 11.5–15.5)
WBC: 5.2 10*3/uL (ref 4.0–10.5)

## 2014-02-02 LAB — URINALYSIS, ROUTINE W REFLEX MICROSCOPIC
BILIRUBIN URINE: NEGATIVE
Glucose, UA: NEGATIVE mg/dL
Hgb urine dipstick: NEGATIVE
KETONES UR: NEGATIVE mg/dL
Leukocytes, UA: NEGATIVE
NITRITE: NEGATIVE
Protein, ur: NEGATIVE mg/dL
Specific Gravity, Urine: 1.016 (ref 1.005–1.030)
UROBILINOGEN UA: 1 mg/dL (ref 0.0–1.0)
pH: 5 (ref 5.0–8.0)

## 2014-02-02 LAB — COMPREHENSIVE METABOLIC PANEL
ALT: 11 U/L (ref 0–53)
AST: 22 U/L (ref 0–37)
Albumin: 3.8 g/dL (ref 3.5–5.2)
Alkaline Phosphatase: 97 U/L (ref 39–117)
Anion gap: 11 (ref 5–15)
BUN: 24 mg/dL — AB (ref 6–23)
CO2: 25 mmol/L (ref 19–32)
CREATININE: 1.24 mg/dL (ref 0.50–1.35)
Calcium: 9 mg/dL (ref 8.4–10.5)
Chloride: 105 mmol/L (ref 96–112)
GFR calc Af Amer: 61 mL/min — ABNORMAL LOW (ref >90)
GFR calc non Af Amer: 52 mL/min — ABNORMAL LOW (ref >90)
Glucose, Bld: 114 mg/dL — ABNORMAL HIGH (ref 70–99)
POTASSIUM: 4.3 mmol/L (ref 3.5–5.1)
Sodium: 141 mmol/L (ref 135–145)
Total Bilirubin: 1.9 mg/dL — ABNORMAL HIGH (ref 0.3–1.2)
Total Protein: 6.8 g/dL (ref 6.0–8.3)

## 2014-02-02 LAB — I-STAT TROPONIN, ED
Troponin i, poc: 0.01 ng/mL (ref 0.00–0.08)
Troponin i, poc: 0.02 ng/mL (ref 0.00–0.08)

## 2014-02-02 MED ORDER — LORAZEPAM 1 MG PO TABS
0.5000 mg | ORAL_TABLET | Freq: Once | ORAL | Status: AC
  Administered 2014-02-02: 0.5 mg via ORAL
  Filled 2014-02-02: qty 1

## 2014-02-02 MED ORDER — LORAZEPAM 1 MG PO TABS: 1.0000 mg | ORAL_TABLET | Freq: Once | ORAL | Status: DC

## 2014-02-02 NOTE — ED Provider Notes (Signed)
CSN: [id number]     Arrival date & time 02/02/14  1800 History   First MD Initiated Contact with Patient 02/02/14 2051     Chief Complaint  Patient presents with  . Dizziness   (Consider location/radiation/quality/duration/timing/severity/associated sxs/prior Treatment) HPI Keith Ellis is a 81 yo male presenting with report of nervousness today and shortness of breath.  He states he often has panic attacks when he gets into arguments with his son or watches something violent on TV.  He states he began to feel nervous and anxious last night and then again tonight.  He called 911 because the feeling of nervousness became more bothersome.  He denies any pain during this time.  He reports the feeling has subsided since arriving in the ED.  A second son is at the bedside who states he went to visit the pt yesterday and noticed he seemed more confused.  He noticed his pills were disorganized in the container and is concerned he may have been mixing up his meds.  He denies fever, chills, chest pain, focal weakness, nausea, or vomiting.   Past Medical History  Diagnosis Date  . GERD (gastroesophageal reflux disease)   . Hypertension   . Blind     Right enucleation  . Hearing loss   . Coronary artery disease     Minimal LAD stenosis 2007  . BPH (benign prostatic hypertrophy)   . Hyperlipidemia   . Varicose vein   . Peripheral neuropathy   . Tremor, essential   . Aortic sclerosis     2008  . Aortic stenosis     Mild, echo, January, 2014  . Atrial flutter     December, 2013  . Chronic anticoagulation     Apixaban started December 27, 2011  . Edema     December, 2013  . CHF (congestive heart failure)   . Hearing difficulty of both ears     hearing aids  . Urine retention 01/08/2013  . Depression   . Urinary catheter in place   . Syncope 07/06/2013   Past Surgical History  Procedure Laterality Date  . Lung surgery      Benign lump removed  . Tumor excision      behindleft  eye,abpve left eye,left foot  . Esophagogastroduodenoscopy N/A 06/23/2012    Procedure: ESOPHAGOGASTRODUODENOSCOPY (EGD);  Surgeon: Ladene Artist, MD;  Location: [facility];  Service: Endoscopy;  Laterality: N/A;  . Upper gastrointestinal endoscopy    . Cataract extraction Right   . Eye surgery Left     removal eyeball  . Transurethral resection of prostate N/A 03/11/2013    Procedure: TRANSURETHRAL RESECTION OF THE PROSTATE WITH GYRUS INSTRUMENTS;  Surgeon: Alexis Frock, MD;  Location: [facility];  Service: Urology;  Laterality: N/A;   Family History  Problem Relation Age of Onset  . Cancer Father     Rectal  . Rectal cancer Father   . Cancer Mother     Breast  . Stomach cancer Neg Hx   . Esophageal cancer Neg Hx   . Colon cancer Neg Hx   . Cancer Sister    History  Substance Use Topics  . Smoking status: Former Smoker    Types: Cigarettes    Quit date: 07/20/1975  . Smokeless tobacco: Never Used  . Alcohol Use: No    Review of Systems  Constitutional: Negative for fever and chills.  HENT: Negative for sore throat.   Eyes: Negative for visual disturbance.  Respiratory: Negative for  cough and shortness of breath.   Cardiovascular: Negative for chest pain and leg swelling.  Gastrointestinal: Negative for nausea, vomiting and diarrhea.  Genitourinary: Negative for dysuria.  Musculoskeletal: Negative for myalgias.  Skin: Negative for rash.  Neurological: Negative for weakness, numbness and headaches.  Psychiatric/Behavioral: The patient is nervous/anxious.     Allergies  Menthol  Home Medications   Prior to Admission medications   Medication Sig Start Date End Date Taking? Authorizing Provider  acetaminophen-codeine (TYLENOL #4) 300-60 MG per tablet Take 1 tablet by mouth every 4 (four) hours as needed for moderate pain. 1/2 to 1 every 6 hours as needed for pain 12/24/13   [name], MD  Coconut Oil 1000 MG CAPS Take 1,000 mg by mouth 2 (two) times daily.     Historical Provider, MD  [employer] Liver Oil CAPS Take 1 capsule by mouth daily.    Historical Provider, MD  Cyanocobalamin (B-12 PO) Take 1 capsule by mouth daily.    Historical Provider, MD  ELDERBERRY PO Take 1 capsule by mouth daily.    Historical Provider, MD  ELIQUIS 5 MG TABS tablet TAKE ONE TABLET BY MOUTH TWICE DAILY    Minus Breeding, MD  LORazepam (ATIVAN) 1 MG tablet Take 1 tablet (1 mg total) by mouth at bedtime. 01/23/14   [name], MD  Multiple Vitamin (MULTIVITAMIN WITH MINERALS) TABS tablet Take 1 tablet by mouth daily.    Historical Provider, MD  omeprazole (PRILOSEC) 40 MG capsule Take 1 capsule (40 mg total) by mouth daily. 07/31/13   Shawnee Knapp, MD  potassium chloride SA (K-DUR,KLOR-CON) 20 MEQ tablet Take 1 tablet (20 mEq total) by mouth daily. Along with lasix 07/19/13   Shawnee Knapp, MD  predniSONE (DELTASONE) 20 MG tablet Take 2 tablets (40 mg total) by mouth daily. 12/24/13   [name], MD  torsemide (DEMADEX) 20 MG tablet TAKE 1 TABLET BY MOUTH TWICE DAILY 01/04/14   [name], MD   BP 184/61 mmHg  Pulse 62  Temp(Src) 97.6 F (36.4 C) (Oral)  Resp 24  SpO2 100% Physical Exam  Constitutional: He is oriented to person, place, and time. He appears well-developed and well-nourished. No distress.  HENT:  Head: Normocephalic and atraumatic.  Mouth/Throat: Oropharynx is clear and moist. No oropharyngeal exudate.  Eyes:  Right pupil is reactive and not injected. Left eye is prosthetic  Neck: Neck supple. No thyromegaly present.  Cardiovascular: Normal rate, regular rhythm and intact distal pulses.   Pitting edema bilat lower extremities  Pulmonary/Chest: Effort normal and breath sounds normal. No respiratory distress. He has no wheezes. He has no rales. He exhibits no tenderness.  Abdominal: Soft. There is no tenderness.  Musculoskeletal: He exhibits no tenderness.  Lymphadenopathy:    He has no cervical adenopathy.  Neurological: He is alert and oriented  to person, place, and time. He has normal strength. No cranial nerve deficit or sensory deficit.  Cranial nerves 2-12  Skin: Skin is warm and dry. No rash noted. He is not diaphoretic.  Psychiatric: He has a normal mood and affect.  Nursing note and vitals reviewed.   ED Course  Procedures (including critical care time) Labs Review Labs Reviewed  CBC - Abnormal; Notable for the following:    RDW 15.7 (*)    Platelets 103 (*)    All other components within normal limits  COMPREHENSIVE METABOLIC PANEL - Abnormal; Notable for the following:    Glucose, Bld 114 (*)    BUN 24 (*)  Total Bilirubin 1.9 (*)    GFR calc non Af Amer 52 (*)    GFR calc Af Amer 61 (*)    All other components within normal limits  DIFFERENTIAL  I-STAT TROPOININ, ED    Imaging Review No results found.   EKG Interpretation None         Final diagnoses:  Altered mental status  Anxiety   81 yo with repeat occurrences of anxious feeling causing him to call EMS.  He has a history of panic attacks and has a prescription of ativan to take at bedtime. He endorses dizziness at times but this is clarified to mean the uneasiness he feels with walking due to his peripheral neuropathy. He has shortness breath during these episodes that resolves after a few minutes when the episode subsides. He denies any chest pain at anytime.  His son reports he seems to be acting not quite himself lately and was concerned he may be mixing up his meds or exposed to illicit drugs because of the pt's other son.  CBC, CMP, UA, UDS, CT head.  No significant abnormality noted on labs or CT.  Pt had 2 episodes of increased anxiety while in the department and vital signs remained at pt's baseline throughout.  He was treated with ativan and symptoms resolved.  Discussed results with pt and his family. He is well-appearing, in no acute distress and vital signs reviewed and not-concerning. He appears safe to be discharged.  Discharge include  follow-up with their PCP.  Return precautions provided.  Pt and family aware of plan and in agreement.   Filed Vitals:   02/02/14 2230 02/02/14 2245 02/02/14 2338 02/03/14 0015  BP: 184/46 160/69 187/42 161/44  Pulse: 52 60  57  Temp:    97.5 F (36.4 C)  TempSrc:    Oral  Resp: 24 14 19 23   SpO2:    97%   Meds given in ED:  Medications  LORazepam (ATIVAN) tablet 0.5 mg (0.5 mg Oral Given 02/02/14 2234)  LORazepam (ATIVAN) tablet 0.5 mg (0.5 mg Oral Given 02/02/14 2338)    Discharge Medication List as of 02/03/2014 12:32 AM         Britt Bottom, NP 02/03/14 7209  Pamella Pert, MD 02/04/14 1222

## 2014-02-02 NOTE — ED Notes (Signed)
Pt arrived by [facility] for dizziness that has been occuring for extended amount of time but more severe x 3-4 days. Pt reports that it increases with movement and turning his head. Pt has neuropathy and gout, causing pain in feet and difficulty ambulating. No acute distress noted at triage.

## 2014-02-03 LAB — RAPID URINE DRUG SCREEN, HOSP PERFORMED
AMPHETAMINES: NOT DETECTED
Barbiturates: NOT DETECTED
Benzodiazepines: POSITIVE — AB
Cocaine: NOT DETECTED
OPIATES: POSITIVE — AB
TETRAHYDROCANNABINOL: NOT DETECTED

## 2014-02-03 NOTE — Discharge Instructions (Signed)
Please follow the directions provided.  Be sure to follow-up with Dr. Joseph Art to ensure you are getting better.  You may take your prescribed ativan as directed for your nervous feeling.  Please take all your medicines as prescribed. Please try to avoid unneeded stress. Don't hesitate to return for any new, worsening or concerning symptoms.    SEEK IMMEDIATE MEDICAL CARE IF:  You have a rapid heartbeat or have chest pain.  You have difficulty breathing.  You have a fever.  You have a headache with a stiff neck.  You cough up blood.  You have blood in your urine or stool.  You have severe agitation or confusion.   Emergency Department Resource Guide 1) Find a Doctor and Pay Out of Pocket Although you won't have to find out who is covered by your insurance plan, it is a good idea to ask around and get recommendations. You will then need to call the office and see if the doctor you have chosen will accept you as a new patient and what types of options they offer for patients who are self-pay. Some doctors offer discounts or will set up payment plans for their patients who do not have insurance, but you will need to ask so you aren't surprised when you get to your appointment.  2) Contact Your Local Health Department Not all health departments have doctors that can see patients for sick visits, but many do, so it is worth a call to see if yours does. If you don't know where your local health department is, you can check in your phone book. The CDC also has a tool to help you locate your state's health department, and many state websites also have listings of all of their local health departments.  3) Find a [facility] If your illness is not likely to be very severe or complicated, you may want to try a walk in clinic. These are popping up all over the country in pharmacies, drugstores, and shopping centers. They're usually staffed by nurse practitioners or physician assistants that have been  trained to treat common illnesses and complaints. They're usually fairly quick and inexpensive. However, if you have serious medical issues or chronic medical problems, these are probably not your best option.  No Primary Care Doctor: - Call Health Connect at  [phone] - they can help you locate a primary care doctor that  accepts your insurance, provides certain services, etc. - Physician Referral Service- [phone]  Chronic Pain Problems: Organization         Address  Phone   Notes  [facility]  [phone] Patients need to be referred by their primary care doctor.   Medication Assistance: Organization         Address  Phone   Notes  [facility] Medication [facility] [address] [place], [place] [address] [phone] --Must be a resident of [facility] -- Must have NO insurance coverage whatsoever (no Medicaid/ Medicare, etc.) -- The pt. MUST have a primary care doctor that directs their care regularly and follows them in the community   MedAssist  [phone]   [employer]  [phone]    Agencies that provide inexpensive medical care: Organization         Address  Phone   Notes  [place]  [phone]   [facility]    [phone]   [facility]  [place], [place] [address] [phone]   [place] [address] [place] [phone]   Planned Parenthood    [phone]   [facility]    [phone]   [place] and [place] [address], [place] Phone:  [phone], Fax:  [phone] Hours of Operation:  9 am - 6 pm, M-F.  Also accepts Medicaid/Medicare and self-pay.  [facility] for [place] [address], [address], [place] Phone: [phone], Fax: [phone]. Hours of Operation:  8:30 am - 5:30 pm, M-F.  Also accepts Medicaid and self-pay.   [facility] [address] [place] Phone: [phone]   [place], [place], [place] [phone], Ext. [phone] Mondays & Thursdays: 7-9 AM.  First 15 patients are seen on a first come, first serve basis.    [place] Providers:  Organization         Address  Phone   Notes  [facility] [address], [place] [phone] Also accepts self-pay patients.  [facility] [address], [place]  [phone]   [place], [address], [place] [phone]   [facility] [address] [place] [phone]   Lucianne Lei [address], [place]   [phone] Only accepts [place] Access [place] patients after they have their name applied to their card.   Self-Pay (no insurance) in [facility]:  Organization         Address  Phone   Notes  Sickle Cell Patients, [facility] [place] [phone]   [facility] [place] [phone]   [facility] [place]  [place], [place], [place] [phone]   [facility]/Dr. Osei-Bonsu  [address], [place] or [address], [place] [phone] Phone number for both [place] and [place] locations is the same.  Urgent Medical and [facility] [address], [place] [phone]   [facility] [address] [place] or [address] [phone]   [facility] [address], [place] [phone], phone; [phone], fax Sees patients 1st and 3rd Saturday of every month.  Must not qualify for public or private insurance (i.e. Medicaid, Medicare, [place] Health Choice, Veterans' Benefits)  Household income should be no  more than 200% of the poverty level The clinic cannot treat you if you are pregnant or think you are pregnant  Sexually transmitted diseases are not treated at the clinic.    Dental Care: Organization         Address  Phone  Notes  [facility] Department of [facility] [place] [phone] Accepts children up to age 66 who are enrolled in [place] or [place]; pregnant women with a Medicaid card; and children who have applied for Medicaid or [place] Health Choice, but were declined, whose parents can pay a reduced fee at time of service.  [facility] Department of [facility]  [address], [place] [phone] Accepts children up to age 2 who are enrolled in [place] or [place]  Health Choice; pregnant women with a Medicaid card; and children who have applied for Medicaid or [place] Health Choice, but were declined, whose parents can pay a reduced fee at time of service.  [place] Adult [facility] Access PROGRAM  [place] [phone] Patients are seen by appointment only. Walk-ins are not accepted. [place] will see patients 17 years of age and older. Monday - Tuesday (8am-5pm) Most Wednesdays (8:30-5pm) $30 per visit, cash only  [place] [facility] Access [facility]  [address], [facility] [phone] Patients are seen by appointment only. Walk-ins are not accepted. [place] will see patients 16 years of age and older. One Wednesday Evening (Monthly: Volunteer Based).  $30 per visit, cash only  [place]  [phone] for adults; Children under age 29, call Graduate Pediatric Dentistry at [phone]. Children aged 10-14, please call [phone] to request a pediatric application.  Dental services are provided in all areas of dental care including fillings, crowns and bridges, complete and partial dentures, implants, gum treatment, root canals,  and extractions. Preventive care is also provided. Treatment is provided to both adults and children. Patients are selected via a lottery and there is often a waiting list.   [facility] [address] [place]  [phone] [email or web address]   [facility] [address] [place], [place] [phone], Ext. [phone] Second and Fourth Thursday of each month, opens at 6:30 AM; Clinic ends at 9 AM.  Patients are seen on a first-come first-served basis, and a limited number are seen during each clinic.   [facility]  [address] [place], [place] [phone]   Eligibility Requirements You must have lived in [place], [place], or [place] counties for at least the last three months.   You cannot be eligible for state or federal sponsored [employer], including [employer], [place], or [employer].   You generally cannot be eligible for healthcare insurance through your employer.    How to apply: Eligibility screenings are held every Tuesday and Wednesday afternoon from 1:00 pm until 4:00 pm. You do not need an appointment for the interview!  [facility] [address] [place], [place]   [place]  [place] Department  [place]  [phone]    Behavioral Health Resources in the Community: Intensive Outpatient Programs Organization         Address  Phone  Notes  [place]. [address] [place], [place] [phone]   [facility] [address] [place], [place]   ADS: Alcohol & Drug Svcs [address], [place], [place]   [place] [address]  [place], [place] or [phone]   Substance Abuse Resources Organization         Address  Phone  Notes  Alcohol and Drug Services  [phone]    [place]  [phone]   [facility] [place]   Chinita Pester  [phone]   Residential & Outpatient Substance Abuse Program  [phone]   Psychological Services Organization         Address  Phone  Notes  [facility] [place]  [place]  [phone]   [place] [address] [place] or [phone]  Mobile Crisis Teams Organization         Address  Phone  Notes  [facility], Mobile Crisis Care Unit  [phone]   [facility]  [address] [place], [place]   [place] Levels [address] [place] [phone]    Self-Help/Support Groups Organization         Address  Phone             Notes  [place]. of [place] - variety of support groups  [place] Call for more information  Narcotics Anonymous (NA), [facility] [address], [employer] [place]  2 meetings at this location   Special educational needs teacher         Address  Phone  Notes  [facility]idential Treatment [place],    [place]  [phone]   [facility]  [address] [place] [address], [place], [place]   [place], [place] [phone] Admissions: 8am-3pm M-F  Incentives Substance [place] [address],    [place], [place] [phone]   [facility] [address] [place], [place], [place]   [facility] [address]  [place], [place]   Insight Programs - Intensive Outpatient [address], [place], [place]   [facility] ([place].) [place].,  [place], [place] [phone] or [phone]   [facility]idential Treatment Services (RTS) [address] [place], [place] Accepts Medicaid  [facility] [place] [address]    [place] [phone] Substance Abuse/Addiction Treatment   [facility] ([facility]) Organization         Address  Phone  Notes  [facility]  [phone]   Domenic Schwab, PhD [address] Arlis [place], [place]   [phone] or [phone]   [place], [place] [phone]   [facility] [address], [place], [place] [phone] Insurance/Medicaid/sponsorship through [facility] and Families [address] [place]                                    [place], [place] [phone] [place] [address], [place] [phone]    Dr. Adele Schilder  [phone]   [facility] Dept. 1) [address] [place] 2) [place] 3)  [place] [address], [place] [phone]  [phone]   [place] [phone] or [phone] (After Hours)

## 2014-02-03 NOTE — ED Notes (Signed)
Pt. Left with all belongings 

## 2014-02-04 ENCOUNTER — Ambulatory Visit (INDEPENDENT_AMBULATORY_CARE_PROVIDER_SITE_OTHER): Payer: Medicare HMO | Admitting: Family Medicine

## 2014-02-04 ENCOUNTER — Encounter: Payer: Self-pay | Admitting: Family Medicine

## 2014-02-04 VITALS — BP 199/63 | HR 58 | Temp 97.1°F | Resp 16 | Ht 66.5 in | Wt 200.0 lb

## 2014-02-04 DIAGNOSIS — F410 Panic disorder [episodic paroxysmal anxiety] without agoraphobia: Secondary | ICD-10-CM

## 2014-02-04 MED ORDER — CLONAZEPAM 0.5 MG PO TABS: 0.5000 mg | ORAL_TABLET | Freq: Two times a day (BID) | ORAL | Status: DC | PRN

## 2014-02-04 MED ORDER — ESCITALOPRAM OXALATE 20 MG PO TABS: 20.0000 mg | ORAL_TABLET | Freq: Every day | ORAL | Status: DC

## 2014-02-04 NOTE — Progress Notes (Signed)
Patient ID: Keith Ellis, male   DOB: 1929-09-07, 81 y.o.   MRN: [id number]  This chart was scribed for [name], MD by Ladene Artist, ED Scribe. The patient was seen in room 22. Patient's care was started at 12:18 PM.  Patient ID: Keith Ellis MRN: [id number], DOB: [birth year], 81 y.o. Date of Encounter: 02/04/2014, 12:18 PM  Primary Physician: [name], MD  Chief Complaint  Patient presents with   Hospitalization Follow-up   Anxiety   HPI: 81 y.o. year old male with history below presents for a hospitalization follow-up. Pt's daughter-in-law reports an increase in anxiety, confusion, abnormal gait, chest palpations, chronic dizziness that has worsened over the past week. Pt's daughter-in-law also states that pt has been forgetting to take his medications and gets confused on the uses of his medications. Pt also reports difficultly sleeping, nausea, SOB with lying down, neck pain prior to hospitalization that has resolved. CAT scan was obtained; normal. He denies vomiting, any pain at this time. Pt has been treating with Ativan with relief.  Patient's daughter-in-law notes that his caretaker role with wife is causing marked changes in anxiety.  Past Medical History  Diagnosis Date   GERD (gastroesophageal reflux disease)    Hypertension    Blind     Right enucleation   Hearing loss    Coronary artery disease     Minimal LAD stenosis 2007   BPH (benign prostatic hypertrophy)    Hyperlipidemia    Varicose vein    Peripheral neuropathy    Tremor, essential    Aortic sclerosis     2008   Aortic stenosis     Mild, echo, January, 2014   Atrial flutter     December, 2013   Chronic anticoagulation     Apixaban started December 27, 2011   Edema     December, 2013   CHF (congestive heart failure)    Hearing difficulty of both ears     hearing aids   Urine retention 01/08/2013   Depression    Urinary catheter in place    Syncope 07/06/2013     Home Meds: Prior to Admission medications   Medication Sig Start Date End Date Taking? Authorizing Provider  acetaminophen-codeine (TYLENOL #4) 300-60 MG per tablet Take 1 tablet by mouth every 4 (four) hours as needed for moderate pain. 1/2 to 1 every 6 hours as needed for pain 12/24/13   [name], MD  Coconut Oil 1000 MG CAPS Take 1,000 mg by mouth 2 (two) times daily.    Historical Provider, MD  [facility] Liver Oil CAPS Take 1 capsule by mouth daily.    Historical Provider, MD  Cyanocobalamin (B-12 PO) Take 1 capsule by mouth daily.    Historical Provider, MD  ELDERBERRY PO Take 1 capsule by mouth daily.    Historical Provider, MD  ELIQUIS 5 MG TABS tablet TAKE ONE TABLET BY MOUTH TWICE DAILY    Minus Breeding, MD  LORazepam (ATIVAN) 1 MG tablet Take 1 tablet (1 mg total) by mouth at bedtime. 01/23/14   [name], MD  Multiple Vitamin (MULTIVITAMIN WITH MINERALS) TABS tablet Take 1 tablet by mouth daily.    Historical Provider, MD  omeprazole (PRILOSEC) 40 MG capsule Take 1 capsule (40 mg total) by mouth daily. 07/31/13   Shawnee Knapp, MD  potassium chloride SA (K-DUR,KLOR-CON) 20 MEQ tablet Take 1 tablet (20 mEq total) by mouth daily. Along with lasix 07/19/13   Shawnee Knapp, MD  predniSONE ([place])  20 MG tablet Take 2 tablets (40 mg total) by mouth daily. Patient taking differently: Take 40 mg by mouth daily as needed (gout).  12/24/13   [name], MD  torsemide (DEMADEX) 20 MG tablet TAKE 1 TABLET BY MOUTH TWICE DAILY 01/04/14   [name], MD    Allergies:  Allergies  Allergen Reactions   Menthol Hives    History   Social History   Marital Status: Married    Spouse Name: Loran Senters"    Number of Children: 3   Years of Education: 8th grade   Occupational History   [employer]     Retired   Social History Main Topics   Smoking status: Former Smoker    Types: Cigarettes    Quit date: 07/20/1975   Smokeless tobacco: Never Used   Alcohol Use:  No   Drug Use: No   Sexual Activity: Not on file   Other Topics Concern   Not on file   Social History Narrative   Patient has hostile neighbor with multiple police reports.  Patient is happily married, but son is at home and is not happy which creates anxious and frustrating relationships.    Caffeine Use: 1 cup daily     Review of Systems: Constitutional: negative for chills, fever, night sweats, weight changes, or fatigue  HEENT: negative for vision changes, hearing loss, congestion, rhinorrhea, ST, epistaxis, or sinus pressure Cardiovascular: negative for chest pain, +palpitations  Respiratory: negative for hemoptysis, wheezing, or cough, +shortness of breath Abdominal: negative for abdominal pain, vomiting, diarrhea, or constipation, +nausea  Dermatological: negative for rash Neurologic: negative for headache, or syncope, +anxiety, +dizziness, +sleep disturbances, +confusion All other systems reviewed and are otherwise negative with the exception to those above and in the HPI.   Physical Exam: Blood pressure 199/63, pulse 58, temperature 97.1 F (36.2 C), resp. rate 16, height 5' 6.5" (1.689 m), weight 200 lb (90.719 kg), SpO2 98 %., Body mass index is 31.8 kg/(m^2). BP during examination: 154/50 General: Well developed, well nourished, in no acute distress. Head: Normocephalic, atraumatic, eyes without discharge, sclera non-icteric, nares are without discharge. Bilateral auditory canals clear, TM's are without perforation, pearly grey and translucent with reflective cone of light bilaterally. Oral cavity moist, posterior pharynx without exudate, erythema, peritonsillar abscess, or post nasal drip.  Neck: Supple. No thyromegaly. Full ROM. No lymphadenopathy. Lungs: Clear bilaterally to auscultation without wheezes, rales, or rhonchi. Breathing is unlabored. Heart: RRR with S1 S2. No murmurs, rubs, or gallops appreciated. Abdomen: Soft, non-tender, non-distended with  normoactive bowel sounds. No hepatomegaly. No rebound/guarding. No obvious abdominal masses. Msk:  Strength and tone normal for age. Extremities/Skin: Warm and dry. No clubbing or cyanosis. No edema. No rashes or suspicious lesions. Neuro: Alert and oriented X 3. Moves all extremities spontaneously. Gait is normal. CNII-XII grossly in tact. Psych:  Responds to questions appropriately with a normal affect.  I spent 30 minutes with patient, his wife, and daughter (hospice nurse) to review symptoms   ASSESSMENT AND PLAN:  81 y.o. year old male with  Panic disorder - Plan: escitalopram (LEXAPRO) 20 MG tablet, clonazePAM (KLONOPIN) 0.5 MG tablet This chart was scribed in my presence and reviewed by me personally.  Signed, [name], MD 02/04/2014 12:18 PM

## 2014-02-05 ENCOUNTER — Emergency Department (HOSPITAL_COMMUNITY)
Admission: EM | Admit: 2014-02-05 | Discharge: 2014-02-05 | Disposition: A | Discharge status: Home / Self Care | Payer: Medicare HMO | Attending: Emergency Medicine | Admitting: Emergency Medicine

## 2014-02-05 ENCOUNTER — Encounter (HOSPITAL_COMMUNITY): Payer: Self-pay | Admitting: Emergency Medicine

## 2014-02-05 DIAGNOSIS — Z7901 Long term (current) use of anticoagulants: Secondary | ICD-10-CM | POA: Diagnosis not present

## 2014-02-05 DIAGNOSIS — I2510 Atherosclerotic heart disease of native coronary artery without angina pectoris: Secondary | ICD-10-CM | POA: Diagnosis not present

## 2014-02-05 DIAGNOSIS — H9193 Unspecified hearing loss, bilateral: Secondary | ICD-10-CM | POA: Insufficient documentation

## 2014-02-05 DIAGNOSIS — Z87891 Personal history of nicotine dependence: Secondary | ICD-10-CM | POA: Diagnosis not present

## 2014-02-05 DIAGNOSIS — Z7902 Long term (current) use of antithrombotics/antiplatelets: Secondary | ICD-10-CM | POA: Insufficient documentation

## 2014-02-05 DIAGNOSIS — F419 Anxiety disorder, unspecified: Secondary | ICD-10-CM | POA: Diagnosis not present

## 2014-02-05 DIAGNOSIS — I10 Essential (primary) hypertension: Secondary | ICD-10-CM | POA: Diagnosis not present

## 2014-02-05 DIAGNOSIS — K219 Gastro-esophageal reflux disease without esophagitis: Secondary | ICD-10-CM | POA: Diagnosis not present

## 2014-02-05 DIAGNOSIS — Z8639 Personal history of other endocrine, nutritional and metabolic disease: Secondary | ICD-10-CM | POA: Diagnosis not present

## 2014-02-05 DIAGNOSIS — I509 Heart failure, unspecified: Secondary | ICD-10-CM | POA: Diagnosis not present

## 2014-02-05 DIAGNOSIS — Z87438 Personal history of other diseases of male genital organs: Secondary | ICD-10-CM | POA: Diagnosis not present

## 2014-02-05 NOTE — ED Provider Notes (Addendum)
CSN: [id number]     Arrival date & time 02/05/14  2059 History   First MD Initiated Contact with Patient 02/05/14 2214     Chief Complaint  Patient presents with  . Anxiety     (Consider location/radiation/quality/duration/timing/severity/associated sxs/prior Treatment) Patient is a 81 y.o. male presenting with anxiety. The history is provided by the patient and a relative.  Anxiety  Keith Ellis is a 81 y.o. male who presents by EMS for "anxiety".  Patient states he needs something for his anxiety.  He saw his PCP, yesterday and was started on clonazepam 0.5 mg twice a day and Lexapro, daily.  He took all of these medicines, today.  Tonight at about 8:30 PM his son saw him, and felt he was at his baseline, then went home.  Shortly after that, the patient called ambulance to bring him here.  Patient was in the ED 2 days ago for evaluation of a similar process, and treated symptomatically, at the time.  Patient's son feels like he is under stress because his mother's dementia.  There've been no other reported problems or illnesses.  There are no other known modifying factors.  Level V caveat- poor historian   Past Medical History  Diagnosis Date  . GERD (gastroesophageal reflux disease)   . Hypertension   . Blind     Right enucleation  . Hearing loss   . Coronary artery disease     Minimal LAD stenosis 2007  . BPH (benign prostatic hypertrophy)   . Hyperlipidemia   . Varicose vein   . Peripheral neuropathy   . Tremor, essential   . Aortic sclerosis     2008  . Aortic stenosis     Mild, echo, January, 2014  . Atrial flutter     December, 2013  . Chronic anticoagulation     Apixaban started December 27, 2011  . Edema     December, 2013  . CHF (congestive heart failure)   . Hearing difficulty of both ears     hearing aids  . Urine retention 01/08/2013  . Depression   . Urinary catheter in place   . Syncope 07/06/2013   Past Surgical History  Procedure Laterality Date   . Lung surgery      Benign lump removed  . Tumor excision      behindleft eye,abpve left eye,left foot  . Esophagogastroduodenoscopy N/A 06/23/2012    Procedure: ESOPHAGOGASTRODUODENOSCOPY (EGD);  Surgeon: Ladene Artist, MD;  Location: [facility];  Service: Endoscopy;  Laterality: N/A;  . Upper gastrointestinal endoscopy    . Cataract extraction Right   . Eye surgery Left     removal eyeball  . Transurethral resection of prostate N/A 03/11/2013    Procedure: TRANSURETHRAL RESECTION OF THE PROSTATE WITH GYRUS INSTRUMENTS;  Surgeon: Alexis Frock, MD;  Location: [facility];  Service: Urology;  Laterality: N/A;   Family History  Problem Relation Age of Onset  . Cancer Father     Rectal  . Rectal cancer Father   . Cancer Mother     Breast  . Stomach cancer Neg Hx   . Esophageal cancer Neg Hx   . Colon cancer Neg Hx   . Cancer Sister    History  Substance Use Topics  . Smoking status: Former Smoker    Types: Cigarettes    Quit date: 07/20/1975  . Smokeless tobacco: Former Systems developer  . Alcohol Use: No    Review of Systems  Unable to perform ROS  Allergies  Menthol  Home Medications   Prior to Admission medications   Medication Sig Start Date End Date Taking? Authorizing Provider  acetaminophen-codeine (TYLENOL #4) 300-60 MG per tablet Take 1 tablet by mouth every 4 (four) hours as needed for moderate pain. 1/2 to 1 every 6 hours as needed for pain 12/24/13   [name], MD  clonazePAM (KLONOPIN) 0.5 MG tablet Take 1 tablet (0.5 mg total) by mouth 2 (two) times daily as needed for anxiety. 02/04/14   [name], MD  Coconut Oil 1000 MG CAPS Take 1,000 mg by mouth 2 (two) times daily.    Historical Provider, MD  [facility] Liver Oil CAPS Take 1 capsule by mouth daily.    Historical Provider, MD  Cyanocobalamin (B-12 PO) Take 1 capsule by mouth daily.    Historical Provider, MD  ELDERBERRY PO Take 1 capsule by mouth daily.    Historical Provider, MD  ELIQUIS 5 MG TABS  tablet TAKE ONE TABLET BY MOUTH TWICE DAILY    Minus Breeding, MD  escitalopram (LEXAPRO) 20 MG tablet Take 1 tablet (20 mg total) by mouth daily. 02/04/14   [name], MD  Multiple Vitamin (MULTIVITAMIN WITH MINERALS) TABS tablet Take 1 tablet by mouth daily.    Historical Provider, MD  omeprazole (PRILOSEC) 40 MG capsule Take 1 capsule (40 mg total) by mouth daily. 07/31/13   Shawnee Knapp, MD  potassium chloride SA (K-DUR,KLOR-CON) 20 MEQ tablet Take 1 tablet (20 mEq total) by mouth daily. Along with lasix 07/19/13   Shawnee Knapp, MD  torsemide (DEMADEX) 20 MG tablet TAKE 1 TABLET BY MOUTH TWICE DAILY 01/04/14   [name], MD   BP 165/49 mmHg  Pulse 53  Temp(Src) 98 F (36.7 C) (Oral)  Resp 17  Ht 5\' 7"  (1.702 m)  Wt 190 lb (86.183 kg)  BMI 29.75 kg/m2  SpO2 94% Physical Exam  Constitutional: He appears well-developed.  Elderly, frail  HENT:  Head: Normocephalic and atraumatic.  Right Ear: External ear normal.  Left Ear: External ear normal.  Eyes: Conjunctivae and EOM are normal. Pupils are equal, round, and reactive to light.  Neck: Normal range of motion and phonation normal. Neck supple.  Cardiovascular: Normal rate, regular rhythm and normal heart sounds.   Pulmonary/Chest: Effort normal and breath sounds normal. He exhibits no bony tenderness.  Abdominal: Soft. There is no tenderness.  Musculoskeletal: Normal range of motion.  Neurological: He is alert. No cranial nerve deficit or sensory deficit. He exhibits normal muscle tone. Coordination normal.  Alert, cooperative and responsive.  Poor historian  Skin: Skin is warm, dry and intact.  Psychiatric: He has a normal mood and affect. His behavior is normal.  Nursing note and vitals reviewed.   ED Course  Procedures (including critical care time)  Findings discussed with patient's son, all questions answered.  The patient's son and his wife, are primary caregivers for the patient and the patient's wife.  Labs  Review Labs Reviewed - No data to display  Imaging Review No results found.   EKG Interpretation None      MDM   Final diagnoses:  Anxiety     Ongoing anxiety, currently under treatment, now with a new medication regimen.  There is no indication for changing medication now or further evaluation in ED setting.   Nursing Notes Reviewed/ Care Coordinated Applicable Imaging Reviewed Interpretation of Laboratory Data incorporated into ED treatment  The patient appears reasonably screened and/or stabilized for discharge and I  doubt any other medical condition or other [facility] requiring further screening, evaluation, or treatment in the ED at this time prior to discharge.  Plan: Home Medications- usual; Home Treatments- rest; return here if the recommended treatment, does not improve the symptoms; Recommended follow up- PCP as scheduled and as needed.    Richarda Blade, MD 02/05/14 [id number]     Richarda Blade, MD 02/05/14 [id number]

## 2014-02-05 NOTE — ED Notes (Signed)
Pt brought to ED by [facility] from home for c/o increases anxiety, pt states he was seen by PCP last Monday for the same reason. PCP increases his Klonopin dose, but he didn't took his meds until today, pt still feeling anxious. Pt AO x4, VSS, denies any n/v/d, no fevers or chills, wife at the bedside.

## 2014-02-05 NOTE — ED Notes (Signed)
Pt made aware to return if symptoms worsen or if any life threatening symptoms occur.   

## 2014-02-05 NOTE — Discharge Instructions (Signed)
Panic Attacks °Panic attacks are sudden, short feelings of great fear or discomfort. You may have them for no reason when you are relaxed, when you are uneasy (anxious), or when you are sleeping.  °HOME CARE °· Take all your medicines as told. °· Check with your doctor before starting new medicines. °· Keep all doctor visits. °GET HELP IF: °· You are not able to take your medicines as told. °· Your symptoms do not get better. °· Your symptoms get worse. °GET HELP RIGHT AWAY IF: °· Your attacks seem different than your normal attacks. °· You have thoughts about hurting yourself or others. °· You take panic attack medicine and you have a side effect. °MAKE SURE YOU: °· Understand these instructions. °· Will watch your condition. °· Will get help right away if you are not doing well or get worse. °Document Released: 01/27/2010 Document Revised: 10/15/2012 Document Reviewed: 08/08/2012 °ExitCare® Patient Information ©2015 [employer]. This information is not intended to replace advice given to you by your health care provider. Make sure you discuss any questions you have with your health care provider. ° °

## 2014-02-11 ENCOUNTER — Encounter: Payer: Self-pay | Admitting: Family Medicine

## 2014-02-11 ENCOUNTER — Ambulatory Visit (INDEPENDENT_AMBULATORY_CARE_PROVIDER_SITE_OTHER): Payer: Medicare HMO | Admitting: Family Medicine

## 2014-02-11 VITALS — BP 150/50 | HR 45 | Temp 98.4°F | Resp 16 | Ht 67.0 in | Wt 194.8 lb

## 2014-02-11 DIAGNOSIS — F4323 Adjustment disorder with mixed anxiety and depressed mood: Secondary | ICD-10-CM

## 2014-02-11 DIAGNOSIS — F410 Panic disorder [episodic paroxysmal anxiety] without agoraphobia: Secondary | ICD-10-CM

## 2014-02-11 MED ORDER — LAMOTRIGINE 25 MG PO TABS
25.0000 mg | ORAL_TABLET | Freq: Every day | ORAL | Status: DC
Start: 2014-02-11 — End: 2014-03-05
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
## 2014-02-15 ENCOUNTER — Ambulatory Visit (INDEPENDENT_AMBULATORY_CARE_PROVIDER_SITE_OTHER): Payer: Medicare HMO | Admitting: Adult Health

## 2014-02-15 ENCOUNTER — Encounter: Payer: Self-pay | Admitting: Adult Health

## 2014-02-15 VITALS — BP 183/62 | HR 51 | Ht 67.0 in | Wt 195.0 lb

## 2014-02-15 DIAGNOSIS — G609 Hereditary and idiopathic neuropathy, unspecified: Secondary | ICD-10-CM

## 2014-02-15 DIAGNOSIS — F411 Generalized anxiety disorder: Secondary | ICD-10-CM

## 2014-02-15 DIAGNOSIS — G250 Essential tremor: Secondary | ICD-10-CM

## 2014-02-15 NOTE — Patient Instructions (Signed)
If this discomfort from your peripheral neuropathy increases please let us know.

## 2014-02-15 NOTE — Progress Notes (Signed)
I have read the note, and I agree with the clinical assessment and plan.  WILLIS,CHARLES KEITH   
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
## 2014-02-21 ENCOUNTER — Telehealth: Payer: Self-pay

## 2014-02-21 NOTE — Telephone Encounter (Signed)
Pt was prescribed lamictal and thinks this is preventing him from sleeping at night. Pt's daughter wants to discuss discontinuing this rx.

## 2014-02-23 NOTE — Telephone Encounter (Signed)
Spoke with pt, he states he is not sleeping well at night and it has seemed to be happening after he started the Lamictal. She states she he seems to be ok with his anxiety and maybe only needed this medication for a short term. She wants to see if he can stop taking this. Please advise.

## 2014-02-23 NOTE — Telephone Encounter (Signed)
Please call Itzae Miralles daughter to discuss patients medication    [phone]

## 2014-02-25 NOTE — Telephone Encounter (Signed)
Ok to stop Lamictal.  Call if symptoms return

## 2014-02-25 NOTE — Telephone Encounter (Signed)
Spoke with pt's daughter [name], advised message from Dr. Joseph Art. She will have her dad stop the Lamictal.

## 2014-03-05 ENCOUNTER — Ambulatory Visit (INDEPENDENT_AMBULATORY_CARE_PROVIDER_SITE_OTHER): Payer: Medicare HMO | Admitting: Family Medicine

## 2014-03-05 VITALS — BP 144/48 | HR 49 | Temp 97.9°F | Resp 16 | Ht 67.0 in | Wt 197.0 lb

## 2014-03-05 DIAGNOSIS — F411 Generalized anxiety disorder: Secondary | ICD-10-CM

## 2014-03-05 NOTE — Patient Instructions (Signed)
I am pleased to hear that you're sleeping better and that your nerves are not bothering her as much. I would think you should continue the clonazepam for the next couple months at the current dose. This is a low-dose and if you have a night where you can't fall sleep, I want you to take an extra dose just that night.  I like to see you in a month.

## 2014-03-05 NOTE — Progress Notes (Signed)
   Subjective:    Patient ID: Keith Ellis, male    DOB: 1929-02-13, 81 y.o.   MRN: [id number]  This chart was scribed for [name], MD, by Stephania Fragmin, ED Scribe. This patient was seen in room 4 and the patient's care was started at 11:43 AM.   HPI   HPI Comments: Keith Ellis is a 81 y.o. male who presents to the [facility] for a follow-up for anxiety and panic attacks. Patient reports he is sleeping better and has not been having as many problems with his anxiety and panic attacks. Patient is currently only taking clonazepam at bedtime, and, per Julie's note presented by patient's eldest son, is hoping to get off that and only use as needed.  He will sometimes use a low dose of lorazepam.   He reports occasional SOB when he bends over to his side to lift something up. He denies abdominal pain.    Patient has not been to the ED in the past month.   Review of Systems  Respiratory: Positive for shortness of breath.   Gastrointestinal: Negative for abdominal pain.  Psychiatric/Behavioral: Positive for sleep disturbance. The patient is nervous/anxious.        Objective:   Physical Exam  Constitutional: He is oriented to person, place, and time. He appears well-developed and well-nourished. No distress.  HENT:  Head: Normocephalic and atraumatic.  Eyes: Conjunctivae and EOM are normal.  Neck: Neck supple. No tracheal deviation present.  Cardiovascular: Normal rate and regular rhythm.   Murmur heard. Low pitched systolic murmur which radiates into the right carotid  Pulmonary/Chest: Effort normal. No respiratory distress.  Musculoskeletal: Normal range of motion.  Neurological: He is alert and oriented to person, place, and time.  Skin: Skin is warm and dry.  Psychiatric: He has a normal mood and affect. His behavior is normal.  Nursing note and vitals reviewed.      Assessment & Plan:      This chart was scribed in my presence and reviewed by me  personally.    ICD-9-CM ICD-10-CM   1. Generalized anxiety disorder 300.02 F41.1    Continue current medications and recheck in a month. Overall patient seems to be getting better although is not to wear he needs to be in terms of anxiety at this point.  Signed, [name], MD

## 2014-03-13 ENCOUNTER — Other Ambulatory Visit: Payer: Self-pay | Admitting: Family Medicine

## 2014-03-24 ENCOUNTER — Telehealth: Payer: Self-pay

## 2014-03-24 NOTE — Telephone Encounter (Signed)
Patient says he has gained 8 lbs in two days. He says Dr L told him to call if this happens

## 2014-03-24 NOTE — Telephone Encounter (Signed)
I need to see patient and exam his heart and lungs to figure this out

## 2014-03-25 NOTE — Telephone Encounter (Signed)
Spoke with pt, advised message from Dr. Joseph Art. He will come in tomorrow to see him.

## 2014-03-26 ENCOUNTER — Inpatient Hospital Stay (HOSPITAL_COMMUNITY)
Admission: EM | Admit: 2014-03-26 | Discharge: 2014-04-01 | DRG: 242 | Disposition: A | Discharge status: Home / Self Care | Payer: Medicare HMO | Attending: Cardiovascular Disease | Admitting: Cardiovascular Disease

## 2014-03-26 ENCOUNTER — Encounter (HOSPITAL_COMMUNITY): Payer: Self-pay | Admitting: *Deleted

## 2014-03-26 ENCOUNTER — Ambulatory Visit (INDEPENDENT_AMBULATORY_CARE_PROVIDER_SITE_OTHER): Payer: Medicare HMO

## 2014-03-26 ENCOUNTER — Ambulatory Visit (INDEPENDENT_AMBULATORY_CARE_PROVIDER_SITE_OTHER): Payer: Medicare HMO | Admitting: Family Medicine

## 2014-03-26 ENCOUNTER — Other Ambulatory Visit (HOSPITAL_COMMUNITY): Payer: Self-pay

## 2014-03-26 VITALS — BP 130/58 | HR 46 | Temp 97.4°F | Resp 18 | Wt 207.0 lb

## 2014-03-26 DIAGNOSIS — Z7901 Long term (current) use of anticoagulants: Secondary | ICD-10-CM | POA: Diagnosis not present

## 2014-03-26 DIAGNOSIS — R0601 Orthopnea: Secondary | ICD-10-CM | POA: Diagnosis not present

## 2014-03-26 DIAGNOSIS — I517 Cardiomegaly: Secondary | ICD-10-CM

## 2014-03-26 DIAGNOSIS — R600 Localized edema: Secondary | ICD-10-CM

## 2014-03-26 DIAGNOSIS — I495 Sick sinus syndrome: Secondary | ICD-10-CM | POA: Diagnosis not present

## 2014-03-26 DIAGNOSIS — Z959 Presence of cardiac and vascular implant and graft, unspecified: Secondary | ICD-10-CM

## 2014-03-26 DIAGNOSIS — Z9001 Acquired absence of eye: Secondary | ICD-10-CM | POA: Diagnosis present

## 2014-03-26 DIAGNOSIS — R339 Retention of urine, unspecified: Secondary | ICD-10-CM | POA: Diagnosis present

## 2014-03-26 DIAGNOSIS — F329 Major depressive disorder, single episode, unspecified: Secondary | ICD-10-CM | POA: Diagnosis present

## 2014-03-26 DIAGNOSIS — H9190 Unspecified hearing loss, unspecified ear: Secondary | ICD-10-CM | POA: Diagnosis present

## 2014-03-26 DIAGNOSIS — I4892 Unspecified atrial flutter: Secondary | ICD-10-CM | POA: Diagnosis present

## 2014-03-26 DIAGNOSIS — G629 Polyneuropathy, unspecified: Secondary | ICD-10-CM | POA: Diagnosis present

## 2014-03-26 DIAGNOSIS — H5441 Blindness, right eye, normal vision left eye: Secondary | ICD-10-CM | POA: Diagnosis present

## 2014-03-26 DIAGNOSIS — Z9841 Cataract extraction status, right eye: Secondary | ICD-10-CM | POA: Diagnosis not present

## 2014-03-26 DIAGNOSIS — I509 Heart failure, unspecified: Secondary | ICD-10-CM | POA: Diagnosis not present

## 2014-03-26 DIAGNOSIS — I480 Paroxysmal atrial fibrillation: Secondary | ICD-10-CM | POA: Diagnosis present

## 2014-03-26 DIAGNOSIS — N183 Chronic kidney disease, stage 3 unspecified: Secondary | ICD-10-CM | POA: Diagnosis present

## 2014-03-26 DIAGNOSIS — R55 Syncope and collapse: Secondary | ICD-10-CM | POA: Diagnosis not present

## 2014-03-26 DIAGNOSIS — E785 Hyperlipidemia, unspecified: Secondary | ICD-10-CM | POA: Diagnosis present

## 2014-03-26 DIAGNOSIS — R079 Chest pain, unspecified: Secondary | ICD-10-CM

## 2014-03-26 DIAGNOSIS — I129 Hypertensive chronic kidney disease with stage 1 through stage 4 chronic kidney disease, or unspecified chronic kidney disease: Secondary | ICD-10-CM | POA: Diagnosis present

## 2014-03-26 DIAGNOSIS — I2510 Atherosclerotic heart disease of native coronary artery without angina pectoris: Secondary | ICD-10-CM | POA: Diagnosis present

## 2014-03-26 DIAGNOSIS — R001 Bradycardia, unspecified: Secondary | ICD-10-CM | POA: Diagnosis not present

## 2014-03-26 DIAGNOSIS — K219 Gastro-esophageal reflux disease without esophagitis: Secondary | ICD-10-CM | POA: Diagnosis present

## 2014-03-26 DIAGNOSIS — I319 Disease of pericardium, unspecified: Secondary | ICD-10-CM | POA: Diagnosis not present

## 2014-03-26 DIAGNOSIS — I5033 Acute on chronic diastolic (congestive) heart failure: Secondary | ICD-10-CM | POA: Diagnosis present

## 2014-03-26 DIAGNOSIS — I472 Ventricular tachycardia: Secondary | ICD-10-CM | POA: Diagnosis present

## 2014-03-26 DIAGNOSIS — I10 Essential (primary) hypertension: Secondary | ICD-10-CM | POA: Diagnosis present

## 2014-03-26 DIAGNOSIS — Z888 Allergy status to other drugs, medicaments and biological substances status: Secondary | ICD-10-CM

## 2014-03-26 DIAGNOSIS — Z87891 Personal history of nicotine dependence: Secondary | ICD-10-CM

## 2014-03-26 DIAGNOSIS — Z950 Presence of cardiac pacemaker: Secondary | ICD-10-CM

## 2014-03-26 DIAGNOSIS — I4891 Unspecified atrial fibrillation: Secondary | ICD-10-CM

## 2014-03-26 DIAGNOSIS — N401 Enlarged prostate with lower urinary tract symptoms: Secondary | ICD-10-CM | POA: Diagnosis present

## 2014-03-26 DIAGNOSIS — R338 Other retention of urine: Secondary | ICD-10-CM

## 2014-03-26 DIAGNOSIS — I441 Atrioventricular block, second degree: Principal | ICD-10-CM | POA: Diagnosis present

## 2014-03-26 DIAGNOSIS — R0602 Shortness of breath: Secondary | ICD-10-CM | POA: Diagnosis present

## 2014-03-26 DIAGNOSIS — I350 Nonrheumatic aortic (valve) stenosis: Secondary | ICD-10-CM

## 2014-03-26 DIAGNOSIS — I4510 Unspecified right bundle-branch block: Secondary | ICD-10-CM | POA: Diagnosis present

## 2014-03-26 LAB — CBC WITH DIFFERENTIAL/PLATELET
Basophils Absolute: 0 10*3/uL (ref 0.0–0.1)
Basophils Relative: 1 % (ref 0–1)
Eosinophils Absolute: 0.1 10*3/uL (ref 0.0–0.7)
Eosinophils Relative: 1 % (ref 0–5)
HEMATOCRIT: 43.2 % (ref 39.0–52.0)
HEMOGLOBIN: 14.6 g/dL (ref 13.0–17.0)
LYMPHS ABS: 1.3 10*3/uL (ref 0.7–4.0)
LYMPHS PCT: 26 % (ref 12–46)
MCH: 30 pg (ref 26.0–34.0)
MCHC: 33.8 g/dL (ref 30.0–36.0)
MCV: 88.9 fL (ref 78.0–100.0)
MONO ABS: 0.4 10*3/uL (ref 0.1–1.0)
MONOS PCT: 7 % (ref 3–12)
NEUTROS PCT: 65 % (ref 43–77)
Neutro Abs: 3.2 10*3/uL (ref 1.7–7.7)
Platelets: 103 10*3/uL — ABNORMAL LOW (ref 150–400)
RBC: 4.86 MIL/uL (ref 4.22–5.81)
RDW: 16.6 % — ABNORMAL HIGH (ref 11.5–15.5)
WBC: 4.9 10*3/uL (ref 4.0–10.5)

## 2014-03-26 LAB — BASIC METABOLIC PANEL
Anion gap: 11 (ref 5–15)
BUN: 20 mg/dL (ref 6–23)
CHLORIDE: 106 mmol/L (ref 96–112)
CO2: 26 mmol/L (ref 19–32)
Calcium: 9.2 mg/dL (ref 8.4–10.5)
Creatinine, Ser: 1.47 mg/dL — ABNORMAL HIGH (ref 0.50–1.35)
GFR calc Af Amer: 49 mL/min — ABNORMAL LOW (ref >90)
GFR calc non Af Amer: 43 mL/min — ABNORMAL LOW (ref >90)
GLUCOSE: 95 mg/dL (ref 70–99)
POTASSIUM: 4.3 mmol/L (ref 3.5–5.1)
Sodium: 143 mmol/L (ref 135–145)

## 2014-03-26 LAB — TROPONIN I: Troponin I: <0.03 ng/mL (ref <0.031)

## 2014-03-26 LAB — BRAIN NATRIURETIC PEPTIDE: B NATRIURETIC PEPTIDE 5: 635.3 pg/mL — AB (ref 0.0–100.0)

## 2014-03-26 MED ORDER — FUROSEMIDE 10 MG/ML IJ SOLN
40.0000 mg | Freq: Once | INTRAMUSCULAR | Status: AC
  Administered 2014-03-26: 40 mg via INTRAVENOUS
  Filled 2014-03-26: qty 4

## 2014-03-26 NOTE — ED Notes (Signed)
Report attempted, RN to call back. 

## 2014-03-26 NOTE — ED Provider Notes (Signed)
CSN: [id number]     Arrival date & time 03/26/14  1721 History   First MD Initiated Contact with Patient 03/26/14 1839     Chief Complaint  Patient presents with  . Congestive Heart Failure      HPI  She presents via [facility] showed worsening shortness of breath and increased lower extremity edema. Has history of congestive heart failure.  Patient has history of pulmonary hypertension. Is torsemide for dependent edema. Has never been in a frank left sided congestive heart failure. Has baseline EF of 55-60. Has gained 21 pounds over the last 2 weeks and noticed edema and has marked decrease in his exertional capacity. Does not have frank PND or orthopnea. Has not had chest pain. No productive cough or sputum. No fevers chills. Cardiologist, Dr. Percival Spanish.  Past Medical History  Diagnosis Date  . GERD (gastroesophageal reflux disease)   . Hypertension   . Blind     Right enucleation  . Hearing loss   . Coronary artery disease     Minimal LAD stenosis 2007  . BPH (benign prostatic hypertrophy)   . Hyperlipidemia   . Varicose vein   . Peripheral neuropathy   . Tremor, essential   . Aortic sclerosis     2008  . Aortic stenosis     Mild, echo, January, 2014  . Atrial flutter     December, 2013  . Chronic anticoagulation     Apixaban started December 27, 2011  . Edema     December, 2013  . CHF (congestive heart failure)   . Hearing difficulty of both ears     hearing aids  . Urine retention 01/08/2013  . Depression   . Urinary catheter in place   . Syncope 07/06/2013   Past Surgical History  Procedure Laterality Date  . Lung surgery      Benign lump removed  . Tumor excision      behindleft eye,abpve left eye,left foot  . Esophagogastroduodenoscopy N/A 06/23/2012    Procedure: ESOPHAGOGASTRODUODENOSCOPY (EGD);  Surgeon: Ladene Artist, MD;  Location: [facility];  Service: Endoscopy;  Laterality: N/A;  . Upper gastrointestinal endoscopy    . Cataract extraction Right   .  Eye surgery Left     removal eyeball  . Transurethral resection of prostate N/A 03/11/2013    Procedure: TRANSURETHRAL RESECTION OF THE PROSTATE WITH GYRUS INSTRUMENTS;  Surgeon: Alexis Frock, MD;  Location: [facility];  Service: Urology;  Laterality: N/A;   Family History  Problem Relation Age of Onset  . Cancer Father     Rectal  . Rectal cancer Father   . Cancer Mother     Breast  . Stomach cancer Neg Hx   . Esophageal cancer Neg Hx   . Colon cancer Neg Hx   . Cancer Sister    History  Substance Use Topics  . Smoking status: Former Smoker    Types: Cigarettes    Quit date: 07/20/1975  . Smokeless tobacco: Former Systems developer  . Alcohol Use: No    Review of Systems  Constitutional: Positive for fatigue. Negative for fever, chills, diaphoresis and appetite change.  HENT: Negative for mouth sores, sore throat and trouble swallowing.   Eyes: Negative for visual disturbance.  Respiratory: Positive for shortness of breath. Negative for cough, chest tightness and wheezing.   Cardiovascular: Positive for leg swelling. Negative for chest pain.  Gastrointestinal: Negative for nausea, vomiting, abdominal pain, diarrhea and abdominal distention.  Endocrine: Negative for polydipsia, polyphagia and polyuria.  Genitourinary: Negative for dysuria, frequency and hematuria.  Musculoskeletal: Negative for gait problem.  Skin: Negative for color change, pallor and rash.  Neurological: Negative for dizziness, syncope, light-headedness and headaches.  Hematological: Does not bruise/bleed easily.  Psychiatric/Behavioral: Negative for behavioral problems and confusion.      Allergies  Menthol  Home Medications   Prior to Admission medications   Medication Sig Start Date End Date Taking? Authorizing Provider  acetaminophen-codeine (TYLENOL #4) 300-60 MG per tablet Take 1 tablet by mouth every 4 (four) hours as needed for moderate pain. 1/2 to 1 every 6 hours as needed for pain 12/24/13   [name], MD  clonazePAM (KLONOPIN) 0.5 MG tablet Take 1 tablet (0.5 mg total) by mouth 2 (two) times daily as needed for anxiety. Patient taking differently: Take 0.5 mg by mouth daily.  02/04/14   [name], MD  Coconut Oil 1000 MG CAPS Take 1,000 mg by mouth 2 (two) times daily.    Historical Provider, MD  [facility] Liver Oil CAPS Take 1 capsule by mouth daily.    Historical Provider, MD  Cyanocobalamin (B-12 PO) Take 1 capsule by mouth daily.    Historical Provider, MD  ELDERBERRY PO Take 1 capsule by mouth daily.    Historical Provider, MD  ELIQUIS 5 MG TABS tablet TAKE ONE TABLET BY MOUTH TWICE DAILY    Minus Breeding, MD  escitalopram (LEXAPRO) 20 MG tablet Take 1 tablet (20 mg total) by mouth daily. 02/04/14   [name], MD  Multiple Vitamin (MULTIVITAMIN WITH MINERALS) TABS tablet Take 1 tablet by mouth daily.    Historical Provider, MD  omeprazole (PRILOSEC) 40 MG capsule Take 1 capsule (40 mg total) by mouth daily. 07/31/13   Shawnee Knapp, MD  potassium chloride SA (K-DUR,KLOR-CON) 20 MEQ tablet Take 1 tablet (20 mEq total) by mouth daily. Along with lasix 07/19/13   Shawnee Knapp, MD  torsemide (DEMADEX) 20 MG tablet TAKE ONE TABLET BY MOUTH TWICE DAILY 03/14/14   Chelle S Jeffery, PA-C   BP 167/44 mmHg  Pulse 54  Temp(Src) 97.5 F (36.4 C) (Oral)  Resp 16  Ht 5\' 7"  (1.702 m)  Wt 216 lb 1 oz (98.005 kg)  BMI 33.83 kg/m2  SpO2 96% Physical Exam  Constitutional: He is oriented to person, place, and time. He appears well-developed and well-nourished. No distress.  HENT:  Head: Normocephalic.  Eyes: Conjunctivae are normal. Pupils are equal, round, and reactive to light. No scleral icterus.  Neck: Normal range of motion. Neck supple. No thyromegaly present.  Cardiovascular: Normal rate and regular rhythm.  Exam reveals no gallop and no friction rub.   No murmur heard. 2+ bilateral lower extremity edema. No JVD. No muffled heart tones. No small voltage or signs clinically of  Tamponade.  Pulmonary/Chest: Effort normal and breath sounds normal. No respiratory distress. He has no wheezes. He has no rales.  Abdominal: Soft. Bowel sounds are normal. He exhibits no distension. There is no tenderness. There is no rebound.  Musculoskeletal: Normal range of motion.  Neurological: He is alert and oriented to person, place, and time.  Skin: Skin is warm and dry. No rash noted.  Psychiatric: He has a normal mood and affect. His behavior is normal.    ED Course  Procedures (including critical care time) Labs Review Labs Reviewed  BASIC METABOLIC PANEL - Abnormal; Notable for the following:    Creatinine, Ser 1.47 (*)    GFR calc non Af Amer 43 (*)  GFR calc Af Amer 49 (*)    All other components within normal limits  BRAIN NATRIURETIC PEPTIDE - Abnormal; Notable for the following:    B Natriuretic Peptide 635.3 (*)    All other components within normal limits  CBC WITH DIFFERENTIAL/PLATELET - Abnormal; Notable for the following:    RDW 16.6 (*)    Platelets 103 (*)    All other components within normal limits  TROPONIN I    Imaging Review Dg Chest 2 View  03/26/2014   CLINICAL DATA:  Chest pain. Congestive heart failure. Edema and orthopnea.  EXAM: CHEST - 2 VIEW  COMPARISON:  Two-view chest 07/06/2013.  FINDINGS: The cardiopericardial silhouette has enlarged since prior exam. Atherosclerotic calcifications are again noted at the aorta. Mild chronic interstitial coarsening is present. A new right pleural effusion is present. Right basilar airspace disease likely reflects atelectasis. The left lung base is clear.  IMPRESSION: 1. Interval enlargement of the cardiopericardial shadow. A pericardial effusion the knee is considered. Echocardiography may be useful further evaluation. 2. Atherosclerosis. 3. New right pleural effusion. Associated basilar airspace disease likely reflects atelectasis. This could be related to congestive heart failure although there is no  significant edema. Infection is also considered.  These results will be called to the ordering clinician or representative by the Radiologist Assistant, and communication documented in the PACS or zVision Dashboard.   Electronically Signed   By: San Morelle M.D.   On: 03/26/2014 16:34     EKG Interpretation None       Echo 07-07-2013 EF 55-60% Study Conclusions  - Left ventricle: The cavity size was normal. There was mild focal basal hypertrophy of the septum. Systolic function was normal. The estimated ejection fraction was in the range of 55% to 60%. Wall motion was normal; there were no regional wall motion abnormalities. Doppler parameters are consistent with abnormal left ventricular relaxation (grade 1 diastolic dysfunction). Doppler parameters are consistent with elevated ventricular end-diastolic filling pressure. - Aortic valve: There was mild stenosis. - Mitral valve: Calcified annulus. Mildly thickened leaflets . - Left atrium: The atrium was mildly dilated. - Right ventricle: The cavity size was mildly dilated. Wall thickness was normal. Systolic function was normal. - Right atrium: The atrium was mildly dilated. - Tricuspid valve: There was mild regurgitation. - Pulmonic valve: There was no regurgitation. - Pulmonary arteries: Systolic pressure was mildly increased. PA peak pressure: 40 mm Hg (S).    MDM   Final diagnoses:  Congestive heart failure, unspecified congestive heart failure chronicity, unspecified congestive heart failure type  Cardiomegaly    Patient with worsening edema, weight gain, and new cardiomegaly. Rule out paracardial effusion. Patient given IV Lasix here. I discussed the case with Dr. Oval Linsey on call for CHF. Patient be admitted for diuresis, echocardiogram.    Tanna Furry, MD 03/26/14 2226

## 2014-03-26 NOTE — Progress Notes (Signed)
81 yo man with rapid water retention and weight gain over 2 days.  He has started to notice blisters on the lower legs.  He has chronic orthopnea, but this has worsened.  He has dyspnea on exertion walking 100-200 feet to mailbox at home.  Associated:  Loss of interest in activities, fatigue, leg weakness  Objective:  NAD 4+ edema to mid thighs Chest:  Bibasilar rales Heart: holosystolic 2/6 murmur best heard over sternum, seemingly regular slow beat without gallop Abdomen:  Rather tight, nontender.  EKG:  RBBB, atrial fibrillation which is new compared to prior EKG  [facility] reading   Dr. Joseph Art: . Chest x-ray shows pleural effusion in the right lung along with markedly enlarged heart.  Assessment: Congestive failure secondary to bradycardia and atrial fibrillation. Patient will need cardiology evaluation at the hospital to decide the next step (pacemaker, diuretics, etc.)  Signed, Carola Frost.D.

## 2014-03-26 NOTE — ED Notes (Signed)
The pt was sent here from his doctors office  With fluid around his heart.  No chest pain sl sob.  Chest xray in the office
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
## 2014-03-27 DIAGNOSIS — I319 Disease of pericardium, unspecified: Secondary | ICD-10-CM

## 2014-03-27 DIAGNOSIS — I5033 Acute on chronic diastolic (congestive) heart failure: Secondary | ICD-10-CM

## 2014-03-27 LAB — CBC
HCT: 43.2 % (ref 39.0–52.0)
Hemoglobin: 14.4 g/dL (ref 13.0–17.0)
MCH: 29.9 pg (ref 26.0–34.0)
MCHC: 33.3 g/dL (ref 30.0–36.0)
MCV: 89.6 fL (ref 78.0–100.0)
PLATELETS: 104 10*3/uL — AB (ref 150–400)
RBC: 4.82 MIL/uL (ref 4.22–5.81)
RDW: 16.7 % — ABNORMAL HIGH (ref 11.5–15.5)
WBC: 4.9 10*3/uL (ref 4.0–10.5)

## 2014-03-27 LAB — CREATININE, SERUM
CREATININE: 1.52 mg/dL — AB (ref 0.50–1.35)
GFR calc Af Amer: 47 mL/min — ABNORMAL LOW (ref >90)
GFR calc non Af Amer: 41 mL/min — ABNORMAL LOW (ref >90)

## 2014-03-27 LAB — MAGNESIUM: MAGNESIUM: 2.4 mg/dL (ref 1.5–2.5)

## 2014-03-27 MED ORDER — APIXABAN 5 MG PO TABS
5.0000 mg | ORAL_TABLET | Freq: Two times a day (BID) | ORAL | Status: DC
  Administered 2014-03-27 (×2): 5 mg via ORAL
  Filled 2014-03-27 (×3): qty 1

## 2014-03-27 MED ORDER — SODIUM CHLORIDE 0.9 % IV SOLN: 250.0000 mL | INTRAVENOUS | Status: DC | PRN

## 2014-03-27 MED ORDER — FUROSEMIDE 10 MG/ML IJ SOLN
80.0000 mg | Freq: Two times a day (BID) | INTRAMUSCULAR | Status: DC
  Administered 2014-03-27 – 2014-03-28 (×3): 80 mg via INTRAVENOUS
  Filled 2014-03-27 (×4): qty 8

## 2014-03-27 MED ORDER — ISOSORBIDE MONONITRATE 15 MG HALF TABLET
15.0000 mg | ORAL_TABLET | Freq: Every day | ORAL | Status: DC
  Administered 2014-03-27 – 2014-04-01 (×6): 15 mg via ORAL
  Filled 2014-03-27 (×6): qty 1

## 2014-03-27 MED ORDER — HEPARIN SODIUM (PORCINE) 5000 UNIT/ML IJ SOLN: 5000.0000 [IU] | Freq: Three times a day (TID) | INTRAMUSCULAR | Status: DC

## 2014-03-27 MED ORDER — ACETAMINOPHEN 325 MG PO TABS: 650.0000 mg | ORAL_TABLET | ORAL | Status: DC | PRN

## 2014-03-27 MED ORDER — ACETAMINOPHEN-CODEINE #3 300-30 MG PO TABS
1.0000 | ORAL_TABLET | ORAL | Status: DC | PRN
  Administered 2014-03-27: 1 via ORAL
  Filled 2014-03-27 (×2): qty 1

## 2014-03-27 MED ORDER — POTASSIUM CHLORIDE CRYS ER 20 MEQ PO TBCR
20.0000 meq | EXTENDED_RELEASE_TABLET | Freq: Every day | ORAL | Status: DC
  Administered 2014-03-27 – 2014-04-01 (×6): 20 meq via ORAL
  Filled 2014-03-27 (×7): qty 1

## 2014-03-27 MED ORDER — CLONAZEPAM 0.5 MG PO TABS
0.5000 mg | ORAL_TABLET | Freq: Every day | ORAL | Status: DC
  Administered 2014-03-27 – 2014-04-01 (×6): 0.5 mg via ORAL
  Filled 2014-03-27 (×6): qty 1

## 2014-03-27 MED ORDER — SODIUM CHLORIDE 0.9 % IJ SOLN: 3.0000 mL | INTRAMUSCULAR | Status: DC | PRN

## 2014-03-27 MED ORDER — ADULT MULTIVITAMIN W/MINERALS CH
1.0000 | ORAL_TABLET | Freq: Every day | ORAL | Status: DC
  Administered 2014-03-27 – 2014-04-01 (×5): 1 via ORAL
  Filled 2014-03-27 (×8): qty 1

## 2014-03-27 MED ORDER — APIXABAN 2.5 MG PO TABS
2.5000 mg | ORAL_TABLET | Freq: Two times a day (BID) | ORAL | Status: DC
  Administered 2014-03-27: 2.5 mg via ORAL
  Filled 2014-03-27 (×3): qty 1

## 2014-03-27 MED ORDER — HYDRALAZINE HCL 10 MG PO TABS
10.0000 mg | ORAL_TABLET | Freq: Three times a day (TID) | ORAL | Status: DC
  Administered 2014-03-27 – 2014-04-01 (×17): 10 mg via ORAL
  Filled 2014-03-27 (×22): qty 1

## 2014-03-27 MED ORDER — SODIUM CHLORIDE 0.9 % IJ SOLN
3.0000 mL | Freq: Two times a day (BID) | INTRAMUSCULAR | Status: DC
  Administered 2014-03-27 – 2014-03-30 (×8): 3 mL via INTRAVENOUS

## 2014-03-27 MED ORDER — ONDANSETRON HCL 4 MG/2ML IJ SOLN: 4.0000 mg | Freq: Four times a day (QID) | INTRAMUSCULAR | Status: DC | PRN

## 2014-03-27 MED ORDER — ESCITALOPRAM OXALATE 20 MG PO TABS
20.0000 mg | ORAL_TABLET | Freq: Every day | ORAL | Status: DC
  Administered 2014-03-27 – 2014-04-01 (×6): 20 mg via ORAL
  Filled 2014-03-27 (×6): qty 1

## 2014-03-27 MED ORDER — PANTOPRAZOLE SODIUM 40 MG PO TBEC
40.0000 mg | DELAYED_RELEASE_TABLET | Freq: Every day | ORAL | Status: DC
  Administered 2014-03-27 – 2014-04-01 (×6): 40 mg via ORAL
  Filled 2014-03-27 (×8): qty 1

## 2014-03-27 NOTE — Progress Notes (Addendum)
The patient had a 17 beat run of V Tach at 0347.  He was asymptomatic.  His VSS and are charted.  His heart rhythm is still A. Fib.  His HR also dipped down into the upper 30s briefly a few times.  He was asymptomatic with that as well.  His HR is now in the mid 40s and has remained in that range the majority of the night.  Dr. Oval Linsey was text paged.

## 2014-03-27 NOTE — Progress Notes (Signed)
The patient arrived to 3E19 at 2330 from the ED.  He was oriented to the unit and placed on telemetry.  CCMD was notified.  He is A&Ox4, VSS, and is a standby assist.  He uses a cane to ambulate.  He does not have any pain at this time and he does have a water blister on his right leg that was covered with a foam dressing.  The MD was notified of the patient's arrival.  A heart failure packet was given to the patient and his admission hx was completed by the CN.  His call bell was explained and placed within reach.

## 2014-03-27 NOTE — Progress Notes (Signed)
Echocardiogram 2D Echocardiogram has been performed.  Spickler, Samyria Rudie 03/27/2014, 12:10 PM

## 2014-03-27 NOTE — Progress Notes (Signed)
Patient ID: Keith Ellis, male   DOB: 04-29-1929, 81 y.o.   MRN: [id number]     Subjective:    SOB improving  Objective:   Temp:  [97.4 F (36.3 C)-98.1 F (36.7 C)] 98.1 F (36.7 C) (03/19 0351) Pulse Rate:  [33-237] 49 (03/19 1102) Resp:  [16-30] 18 (03/19 1102) BP: (130-181)/(41-78) 162/49 mmHg (03/19 1102) SpO2:  [93 %-100 %] 100 % (03/19 1102) Weight:  [207 lb (93.895 kg)-216 lb 14.9 oz (98.4 kg)] 216 lb 14.9 oz (98.4 kg) (03/19 0351) Last BM Date: 03/25/14  Filed Weights   03/26/14 1727 03/27/14 0351  Weight: 216 lb 1 oz (98.005 kg) 216 lb 14.9 oz (98.4 kg)    Intake/Output Summary (Last 24 hours) at 03/27/14 1351 Last data filed at 03/27/14 1100  Gross per 24 hour  Intake    820 ml  Output    920 ml  Net   -100 ml    Telemetry: afib rates 40-50s  Exam:  General: NAD  Resp: CTAB  Cardiac: irreg, rate 40, 2/6 systolic murmur RUSB  WC:HENIDPO soft, NT< ND  MSK:1+ bilateral LE edema  Neuro: no focal deficits   Lab Results:  Basic Metabolic Panel:  Recent Labs Lab 03/26/14 1734 03/27/14 0120 03/27/14 0357  NA 143  --   --   K 4.3  --   --   CL 106  --   --   CO2 26  --   --   GLUCOSE 95  --   --   BUN 20  --   --   CREATININE 1.47* 1.52*  --   CALCIUM 9.2  --   --   MG  --   --  2.4    Liver Function Tests: No results for input(s): AST, ALT, ALKPHOS, BILITOT, PROT, ALBUMIN in the last 168 hours.  CBC:  Recent Labs Lab 03/26/14 1734 03/27/14 0120  WBC 4.9 4.9  HGB 14.6 14.4  HCT 43.2 43.2  MCV 88.9 89.6  PLT 103* 104*    Cardiac Enzymes:  Recent Labs Lab 03/26/14 1734  TROPONINI <0.03    BNP: No results for input(s): PROBNP in the last 8760 hours.  Coagulation: No results for input(s): INR in the last 168 hours.  ECG:   Medications:   Scheduled Medications: . apixaban  2.5 mg Oral BID  . clonazePAM  0.5 mg Oral Daily  . escitalopram  20 mg Oral Daily  . furosemide  80 mg Intravenous BID  . hydrALAZINE  10  mg Oral 3 times per day  . isosorbide mononitrate  15 mg Oral Daily  . multivitamin with minerals  1 tablet Oral Daily  . pantoprazole  40 mg Oral Daily  . potassium chloride SA  20 mEq Oral Daily  . sodium chloride  3 mL Intravenous Q12H     Infusions:     PRN Medications:  sodium chloride, acetaminophen, acetaminophen-codeine, ondansetron (ZOFRAN) IV, sodium chloride     Assessment/Plan    81 yo male hx of CAD, HTN, afib, bradycardia, HL admitted with acute on chrnoic diastolic HF.  1. Acute on chronic diastolic HF - 02/4233 echo LVEF 36-14%, grade I diastolic dysfunction - reports increased SOB and 10 lbs weight gain. Reports compliance with diuretics, however has not been limiting his sodium intake.  - on lasix IV 80mg  bid, just received his first 80mg  dose this AM. Will follow output.  - repeat echo pending. Started hydral and nitrates for afterload reduction, will  continue   2. Afib - low heart rates, not requirig AV nodal agents. On eliquis for stroke prevention.  3. Bradycardia - heart rates 40s with afib and RBBB, stable blood pressures. Prior EKGs show SR with very long PR and RBBB. He clearly has conduction disease. Reports often feeling dizzy with walking, has had no syncope or presyncope - will check TSH. Once more euvolemic have him ambulate with nursing to eval heart rate response. Will likely need to be seen by EP for consideration of PPM.  - avoid AV nodal agents.  Carlyle Dolly, M.D.

## 2014-03-27 NOTE — Progress Notes (Signed)
1750 eating well . Claimed feeling better. Verbalized plan of care

## 2014-03-28 LAB — BASIC METABOLIC PANEL
ANION GAP: 9 (ref 5–15)
BUN: 19 mg/dL (ref 6–23)
CO2: 26 mmol/L (ref 19–32)
CREATININE: 1.42 mg/dL — AB (ref 0.50–1.35)
Calcium: 8.9 mg/dL (ref 8.4–10.5)
Chloride: 104 mmol/L (ref 96–112)
GFR calc Af Amer: 52 mL/min — ABNORMAL LOW (ref >90)
GFR calc non Af Amer: 44 mL/min — ABNORMAL LOW (ref >90)
GLUCOSE: 96 mg/dL (ref 70–99)
POTASSIUM: 4 mmol/L (ref 3.5–5.1)
SODIUM: 139 mmol/L (ref 135–145)

## 2014-03-28 LAB — CBC
HEMATOCRIT: 43 % (ref 39.0–52.0)
Hemoglobin: 14 g/dL (ref 13.0–17.0)
MCH: 29.6 pg (ref 26.0–34.0)
MCHC: 32.6 g/dL (ref 30.0–36.0)
MCV: 90.9 fL (ref 78.0–100.0)
Platelets: 93 10*3/uL — ABNORMAL LOW (ref 150–400)
RBC: 4.73 MIL/uL (ref 4.22–5.81)
RDW: 16.6 % — ABNORMAL HIGH (ref 11.5–15.5)
WBC: 6 10*3/uL (ref 4.0–10.5)

## 2014-03-28 LAB — TSH: TSH: 5.055 u[IU]/mL — AB (ref 0.350–4.500)

## 2014-03-28 LAB — MAGNESIUM: Magnesium: 2.3 mg/dL (ref 1.5–2.5)

## 2014-03-28 MED ORDER — APIXABAN 5 MG PO TABS
5.0000 mg | ORAL_TABLET | Freq: Two times a day (BID) | ORAL | Status: DC
  Administered 2014-03-28 – 2014-03-30 (×5): 5 mg via ORAL
  Filled 2014-03-28 (×6): qty 1

## 2014-03-28 MED ORDER — FUROSEMIDE 10 MG/ML IJ SOLN
80.0000 mg | Freq: Three times a day (TID) | INTRAMUSCULAR | Status: DC
  Administered 2014-03-28 – 2014-03-30 (×6): 80 mg via INTRAVENOUS
  Filled 2014-03-28 (×7): qty 8

## 2014-03-28 NOTE — Progress Notes (Signed)
Patient ID: Keith Ellis, male   DOB: 07-Jan-1930, 81 y.o.   MRN: [id number]    Primary cardiologist:  Subjective:    - SOM mildly improved  Objective:   Temp:  [97.7 F (36.5 C)-97.9 F (36.6 C)] 97.7 F (36.5 C) (03/20 0448) Pulse Rate:  [46-57] 47 (03/20 1040) Resp:  [18-20] 18 (03/20 0448) BP: (132-150)/(42-50) 143/50 mmHg (03/20 1040) SpO2:  [97 %-99 %] 97 % (03/20 0448) Weight:  [199 lb 1.6 oz (90.311 kg)] 199 lb 1.6 oz (90.311 kg) (03/20 0448) Last BM Date: 03/27/14  Filed Weights   03/26/14 1727 03/27/14 0351 03/28/14 0448  Weight: 216 lb 1 oz (98.005 kg) 216 lb 14.9 oz (98.4 kg) 199 lb 1.6 oz (90.311 kg)    Intake/Output Summary (Last 24 hours) at 03/28/14 1225 Last data filed at 03/28/14 1058  Gross per 24 hour  Intake   1370 ml  Output   2025 ml  Net   -655 ml    Telemetry: afib rates 40-60s  Exam:  General:NAD  Resp: crackles bilaterally  VVOHYWV:PXTGG,2/6 systolic murmur RUSB, no JVD  RS:WNIOEVO soft, NT, ND  MSK: 1+ bilateral edema  Neuro: no focal deficits   Lab Results:  Basic Metabolic Panel:  Recent Labs Lab 03/26/14 1734 03/27/14 0120 03/27/14 0357 03/28/14 0615  NA 143  --   --  139  K 4.3  --   --  4.0  CL 106  --   --  104  CO2 26  --   --  26  GLUCOSE 95  --   --  96  BUN 20  --   --  19  CREATININE 1.47* 1.52*  --  1.42*  CALCIUM 9.2  --   --  8.9  MG  --   --  2.4 2.3    Liver Function Tests: No results for input(s): AST, ALT, ALKPHOS, BILITOT, PROT, ALBUMIN in the last 168 hours.  CBC:  Recent Labs Lab 03/26/14 1734 03/27/14 0120  WBC 4.9 4.9  HGB 14.6 14.4  HCT 43.2 43.2  MCV 88.9 89.6  PLT 103* 104*    Cardiac Enzymes:  Recent Labs Lab 03/26/14 1734  TROPONINI <0.03    BNP: No results for input(s): PROBNP in the last 8760 hours.  Coagulation: No results for input(s): INR in the last 168 hours.  ECG:   Medications:   Scheduled Medications: . apixaban  5 mg Oral BID  . clonazePAM  0.5  mg Oral Daily  . escitalopram  20 mg Oral Daily  . furosemide  80 mg Intravenous BID  . hydrALAZINE  10 mg Oral 3 times per day  . isosorbide mononitrate  15 mg Oral Daily  . multivitamin with minerals  1 tablet Oral Daily  . pantoprazole  40 mg Oral Daily  . potassium chloride SA  20 mEq Oral Daily  . sodium chloride  3 mL Intravenous Q12H     Infusions:     PRN Medications:  sodium chloride, acetaminophen, acetaminophen-codeine, ondansetron (ZOFRAN) IV, sodium chloride     Assessment/Plan   1. Acute on chronic diastolic HF - 03/5007 echo LVEF 38-18%, grade I diastolic dysfunction - repeat echo 03/27/14 LVEF 60-65%, grade II diastolic dysfunction, dilated RV, moderate to severe TR, PASP 49.  - on admit reported increased SOB and 10 lbs weight gain. Reports compliance with diuretics, however has not been limiting his sodium intake.  - negative 755 mL since admission, he is on lasix 80mg  bid.  Downtrend in Cr suggesting venous congestion and CHF. Will increase lasix to 80mg  tid, goal would be net negative around 2 liters daily.  - started on admit on hydral and imdur for high blood pressures afterload reduction.   2. Afib - low heart rates, not requirig AV nodal agents. On eliquis for stroke prevention.  3. Bradycardia - heart rates 40s with afib and RBBB, stable blood pressures. Prior EKGs show SR with very long PR and RBBB. He clearly has conduction disease. Reports often feeling dizzy with walking, has had no syncope or presyncope. At times slow rates on tele in low 40s appear more regular, could represent a junctional escapre rhythm - TSH 5.055, will add free T4 and T3.  - avoid AV nodal agents. Pending f/u thyroid studies will may  need to see EP 04-18-22.         Carlyle Dolly, M.D.

## 2014-03-29 ENCOUNTER — Telehealth: Payer: Self-pay

## 2014-03-29 DIAGNOSIS — N183 Chronic kidney disease, stage 3 unspecified: Secondary | ICD-10-CM | POA: Diagnosis present

## 2014-03-29 DIAGNOSIS — R55 Syncope and collapse: Secondary | ICD-10-CM

## 2014-03-29 DIAGNOSIS — I4510 Unspecified right bundle-branch block: Secondary | ICD-10-CM | POA: Diagnosis present

## 2014-03-29 DIAGNOSIS — R001 Bradycardia, unspecified: Secondary | ICD-10-CM

## 2014-03-29 DIAGNOSIS — I484 Atypical atrial flutter: Secondary | ICD-10-CM

## 2014-03-29 LAB — BASIC METABOLIC PANEL
Anion gap: 10 (ref 5–15)
BUN: 18 mg/dL (ref 6–23)
CALCIUM: 8.5 mg/dL (ref 8.4–10.5)
CO2: 27 mmol/L (ref 19–32)
CREATININE: 1.44 mg/dL — AB (ref 0.50–1.35)
Chloride: 102 mmol/L (ref 96–112)
GFR, EST AFRICAN AMERICAN: 51 mL/min — AB (ref >90)
GFR, EST NON AFRICAN AMERICAN: 44 mL/min — AB (ref >90)
GLUCOSE: 106 mg/dL — AB (ref 70–99)
POTASSIUM: 3.5 mmol/L (ref 3.5–5.1)
Sodium: 139 mmol/L (ref 135–145)

## 2014-03-29 LAB — T3: T3, Total: 68 ng/dL — ABNORMAL LOW (ref 71–180)

## 2014-03-29 LAB — URINALYSIS, ROUTINE W REFLEX MICROSCOPIC
Bilirubin Urine: NEGATIVE
Glucose, UA: NEGATIVE mg/dL
Hgb urine dipstick: NEGATIVE
Ketones, ur: NEGATIVE mg/dL
Leukocytes, UA: NEGATIVE
Nitrite: NEGATIVE
Protein, ur: NEGATIVE mg/dL
Specific Gravity, Urine: 1.012 (ref 1.005–1.030)
Urobilinogen, UA: 4 mg/dL — ABNORMAL HIGH (ref 0.0–1.0)
pH: 6 (ref 5.0–8.0)

## 2014-03-29 LAB — CBC
HCT: 40.1 % (ref 39.0–52.0)
Hemoglobin: 13.3 g/dL (ref 13.0–17.0)
MCH: 29.6 pg (ref 26.0–34.0)
MCHC: 33.2 g/dL (ref 30.0–36.0)
MCV: 89.1 fL (ref 78.0–100.0)
Platelets: 87 10*3/uL — ABNORMAL LOW (ref 150–400)
RBC: 4.5 MIL/uL (ref 4.22–5.81)
RDW: 16.6 % — ABNORMAL HIGH (ref 11.5–15.5)
WBC: 4.9 10*3/uL (ref 4.0–10.5)

## 2014-03-29 LAB — MAGNESIUM: Magnesium: 2.2 mg/dL (ref 1.5–2.5)

## 2014-03-29 LAB — T4, FREE: FREE T4: 1.02 ng/dL (ref 0.80–1.80)

## 2014-03-29 MED ORDER — ALPRAZOLAM 0.25 MG PO TABS
0.2500 mg | ORAL_TABLET | Freq: Three times a day (TID) | ORAL | Status: DC | PRN
  Administered 2014-03-29 – 2014-03-30 (×3): 0.25 mg via ORAL
  Filled 2014-03-29 (×3): qty 1

## 2014-03-29 NOTE — Care Management Note (Signed)
    Page 1 of 1   04/01/2014     4:00:41 PM CARE MANAGEMENT NOTE 04/01/2014  Patient:  Keith Ellis, Keith Ellis   Account Number:  [id number]  Date Initiated:  03/29/2014  Documentation initiated by:  Micole Delehanty  Subjective/Objective Assessment:   Pt adm on 03/26/14 with CHF.  PTA, pt independent, lives with spouse.     Action/Plan:   Will follow for dc needs as pt progresses.   Anticipated DC Date:  04/01/2014   Anticipated DC Plan:  [place]  CM consult      Choice offered to / List presented to:     DME arranged  [facility]      DME agency  [facility].        Status of service:  Completed, signed off Medicare Important Message given?  YES (If response is "NO", the following Medicare IM given date fields will be blank) Date Medicare IM given:  03/29/2014 Medicare IM given by:  Kalli Greenfield Date Additional Medicare IM given:  04/01/2014 Additional Medicare IM given by:  Karlo Goeden  Discharge Disposition:  HOME/SELF CARE  Per UR Regulation:  Reviewed for med. necessity/level of care/duration of stay  If discussed at [place] of Stay Meetings, dates discussed:   04/01/2014    Comments:  04/01/14 Ellan Lambert, RN, BSN [phone] PT recommending cane for home; referral to [facility] for DME needs.

## 2014-03-29 NOTE — Telephone Encounter (Signed)
Left message on machine to call back  

## 2014-03-29 NOTE — Consult Note (Signed)
Reason for Consult:   Bradycardia, CHF, syncope  Requesting Physician: Dr Sallyanne Kuster  HPI: This is a 81 y.o. male followed by Dr Percival Spanish with a past medical history significant for HTN, CRI-3, diastolic CHF, mild CAD, RBBB, and PAF in Dec 2013. He has been in NSR till this admission. He is on Eliquis. Pt admitted 03/26/14 with increasing SOB. He was found to be in AF with slow VR 40-50. He is on no AV nodal agents. He has been on chronic anticoagulation with Eliquis. He has diuresed 2.6 L since admission. He is improved symptomatically but still has DOE. Telemetry shows AF with slow VR. He has had short runs of NSVT. Echo shows preserved LVF. He related two episodes of syncope in the last few weeks to me. One while in the bathroom, and one while walking into a drug store.   PMHx:  Past Medical History  Diagnosis Date  . GERD (gastroesophageal reflux disease)   . Hypertension   . Blind     Right enucleation  . Hearing loss   . Coronary artery disease     Minimal LAD stenosis 2007  . BPH (benign prostatic hypertrophy)   . Hyperlipidemia   . Varicose vein   . Peripheral neuropathy   . Tremor, essential   . Aortic sclerosis     2008  . Aortic stenosis     Mild, echo, January, 2014  . Atrial flutter     December, 2013  . Chronic anticoagulation     Apixaban started December 27, 2011  . Edema     December, 2013  . CHF (congestive heart failure)   . Hearing difficulty of both ears     hearing aids  . Urine retention 01/08/2013  . Depression   . Urinary catheter in place   . Syncope 07/06/2013    Past Surgical History  Procedure Laterality Date  . Lung surgery      Benign lump removed  . Tumor excision      behindleft eye,abpve left eye,left foot  . Esophagogastroduodenoscopy N/A 06/23/2012    Procedure: ESOPHAGOGASTRODUODENOSCOPY (EGD);  Surgeon: Ladene Artist, MD;  Location: [facility];  Service: Endoscopy;  Laterality: N/A;  . Upper gastrointestinal  endoscopy    . Cataract extraction Right   . Eye surgery Left     removal eyeball  . Transurethral resection of prostate N/A 03/11/2013    Procedure: TRANSURETHRAL RESECTION OF THE PROSTATE WITH GYRUS INSTRUMENTS;  Surgeon: Alexis Frock, MD;  Location: [facility];  Service: Urology;  Laterality: N/A;    SOCHx:  reports that he quit smoking about 38 years ago. His smoking use included Cigarettes. He has quit using smokeless tobacco. He reports that he does not drink alcohol or use illicit drugs.  FAMHx: Family History  Problem Relation Age of Onset  . Cancer Father     Rectal  . Rectal cancer Father   . Cancer Mother     Breast  . Stomach cancer Neg Hx   . Esophageal cancer Neg Hx   . Colon cancer Neg Hx   . Cancer Sister     ALLERGIES: Allergies  Allergen Reactions  . Menthol Hives    ROS: Pertinent items are noted in HPI. See H&P for complete details  HOME MEDICATIONS: Prior to Admission medications   Medication Sig Start Date End Date Taking? Authorizing Provider  acetaminophen-codeine (TYLENOL #4) 300-60 MG per tablet Take 1 tablet by mouth every 4 (  four) hours as needed for moderate pain. 1/2 to 1 every 6 hours as needed for pain 12/24/13  Yes [name], MD  clonazePAM (KLONOPIN) 0.5 MG tablet Take 1 tablet (0.5 mg total) by mouth 2 (two) times daily as needed for anxiety. Patient taking differently: Take 0.5 mg by mouth daily.  02/04/14  Yes [name], MD  Coconut Oil 1000 MG CAPS Take 1,000 mg by mouth 2 (two) times daily.   Yes Historical Provider, MD  [facility] Liver Oil CAPS Take 1 capsule by mouth daily.   Yes Historical Provider, MD  Cyanocobalamin (B-12 PO) Take 1 capsule by mouth daily.   Yes Historical Provider, MD  ELDERBERRY PO Take 1 capsule by mouth daily.   Yes Historical Provider, MD  ELIQUIS 5 MG TABS tablet TAKE ONE TABLET BY MOUTH TWICE DAILY   Yes Minus Breeding, MD  escitalopram (LEXAPRO) 20 MG tablet Take 1 tablet (20 mg total) by mouth daily.  02/04/14  Yes [name], MD  Multiple Vitamin (MULTIVITAMIN WITH MINERALS) TABS tablet Take 1 tablet by mouth daily.   Yes Historical Provider, MD  omeprazole (PRILOSEC) 40 MG capsule Take 1 capsule (40 mg total) by mouth daily. 07/31/13  Yes Shawnee Knapp, MD  potassium chloride SA (K-DUR,KLOR-CON) 20 MEQ tablet Take 1 tablet (20 mEq total) by mouth daily. Along with lasix 07/19/13  Yes Shawnee Knapp, MD  torsemide (DEMADEX) 20 MG tablet TAKE ONE TABLET BY MOUTH TWICE DAILY 03/14/14  Yes Fara Chute, [place]: I have reviewed the patient's current medications.  VITALS: Blood pressure 134/58, pulse 60, temperature 98 F (36.7 C), temperature source Oral, resp. rate 18, height 5\' 7"  (1.702 m), weight 196 lb 9.6 oz (89.177 kg), SpO2 93 %.  PHYSICAL EXAM: General appearance: alert, cooperative and no distress Neck: no carotid bruit and no JVD Lungs: decreased at bases Heart: irregularly irregular rhythm and 2/6 systolic murmur Abdomen: non tender, not distended Extremities: venous stasis dermatitis noted and trace edema Pulses: diminnished Skin: Skin color, texture, turgor normal. No rashes or lesions Neurologic: Grossly normal  LABS: Results for orders placed or performed during the hospital encounter of 03/26/14 (from the past 24 hour(s))  CBC     Status: Abnormal   Collection Time: 03/28/14  1:14 PM  Result Value Ref Range   WBC 6.0 4.0 - 10.5 K/uL   RBC 4.73 4.22 - 5.81 MIL/uL   Hemoglobin 14.0 13.0 - 17.0 g/dL   HCT 43.0 39.0 - 52.0 %   MCV 90.9 78.0 - 100.0 fL   MCH 29.6 26.0 - 34.0 pg   MCHC 32.6 30.0 - 36.0 g/dL   RDW 16.6 (H) 11.5 - 15.5 %   Platelets 93 (L) 150 - 400 K/uL  T4, free     Status: None   Collection Time: 03/28/14  1:14 PM  Result Value Ref Range   Free T4 1.02 0.80 - 1.80 ng/dL  T3     Status: Abnormal   Collection Time: 03/28/14  1:14 PM  Result Value Ref Range   T3, Total 68 (L) 71 - 180 ng/dL  Basic metabolic panel     Status:  Abnormal   Collection Time: 03/29/14  5:56 AM  Result Value Ref Range   Sodium 139 135 - 145 mmol/L   Potassium 3.5 3.5 - 5.1 mmol/L   Chloride 102 96 - 112 mmol/L   CO2 27 19 - 32 mmol/L   Glucose, Bld 106 (H)  70 - 99 mg/dL   BUN 18 6 - 23 mg/dL   Creatinine, Ser 1.44 (H) 0.50 - 1.35 mg/dL   Calcium 8.5 8.4 - 10.5 mg/dL   GFR calc non Af Amer 44 (L) >90 mL/min   GFR calc Af Amer 51 (L) >90 mL/min   Anion gap 10 5 - 15  Magnesium     Status: None   Collection Time: 03/29/14  5:56 AM  Result Value Ref Range   Magnesium 2.2 1.5 - 2.5 mg/dL    EKG: 03/26/14- AF rate 54, RBBB  IMAGING: CXR 03/26/14 CHEST - 2 VIEW  COMPARISON: Two-view chest 07/06/2013.  FINDINGS: The cardiopericardial silhouette has enlarged since prior exam. Atherosclerotic calcifications are again noted at the aorta. Mild chronic interstitial coarsening is present. A new right pleural effusion is present. Right basilar airspace disease likely reflects atelectasis. The left lung base is clear.  IMPRESSION: 1. Interval enlargement of the cardiopericardial shadow. A pericardial effusion the knee is considered. Echocardiography may be useful further evaluation. 2. Atherosclerosis. 3. New right pleural effusion. Associated basilar airspace disease likely reflects atelectasis. This could be related to congestive heart failure although there is no significant edema. Infection is also considered    IMPRESSION: Principal Problem:   Acute on chronic diastolic CHF (congestive heart failure), NYHA class 3 Active Problems:   Atrial flutter- recurrent with slow VR   Syncope- 2 episodes, two weeks ago   Hypertension   Chronic anticoagulation   Chronic renal disease, stage III   CAD- mild disease 2007   Hyperlipidemia   Aortic stenosis-mild   BPH (benign prostatic hypertrophy) with urinary retention   RBBB   RECOMMENDATION: Dr Lovena Le to see. RN reported some gross hematuria. Will check UA/ C+S and  CBC  Time Spent Directly with Patient: 45 minutes  Keith Ellis [phone] beeper 03/29/2014, 12:09 PM   EP Attending  Patient seen and examined. Agree with the above history, exam, assessment and plan. The patient has bradycardia and atrial fib. Question is how much of this is causing his symptoms. His HR is slow but no long pauses documented. In addition he has conduction system disease. I suspect at least some of his symptoms related to his bradycardia. Would watch overnight. Consider PPM if bradycardia and symptoms persist and as schedule allows.   Mikle Bosworth.D.

## 2014-03-29 NOTE — Telephone Encounter (Signed)
Looks like a CBC and a CMP was ordered on this pt on Fri, but his blood was never drawn. Do you want me to cancel these change them to a future lab only order? Thanks

## 2014-03-29 NOTE — Telephone Encounter (Signed)
Yes, I would like these tests done

## 2014-03-29 NOTE — Evaluation (Signed)
Physical Therapy Evaluation Patient Details Name: Keith Ellis MRN: [id number] DOB: [birth year] Today's Date: 03/29/2014   History of Present Illness  Pt adm with heart failure. PMH - afib, peripheral neuropathy, HTN, syncope, Lt eye enucleation  Clinical Impression  Pt using a cane that is 2" too short. Recommend pt get taller cane. Also recommend OPPT to work on balance but pt unsure if he wants to do that.    Follow Up Recommendations Outpatient PT    Equipment Recommendations       Recommendations for Other Services       Precautions / Restrictions Precautions Precautions: None      Mobility  Bed Mobility                  Transfers Overall transfer level: Modified independent Equipment used: None                Ambulation/Gait Ambulation/Gait assistance: Modified independent (Device/Increase time) Ambulation Distance (Feet): 200 Feet Assistive device: Straight cane Gait Pattern/deviations: Step-through pattern;Decreased stride length     General Gait Details: Pt using cane that is about 2" shorter than it should be.  Stairs            Wheelchair Mobility    Modified Rankin (Stroke Patients Only)       Balance Overall balance assessment: Needs assistance Sitting-balance support: No upper extremity supported;Feet supported Sitting balance-Leahy Scale: Normal     Standing balance support: No upper extremity supported Standing balance-Leahy Scale: Good Standing balance comment: Assist with dynamic balance.                             Pertinent Vitals/Pain Pain Assessment: No/denies pain    Home Living Family/patient expects to be discharged to:: Private residence Living Arrangements: Spouse/significant other Available Help at Discharge: Family;Available 24 hours/day Type of Home: House Home Access: Stairs to enter Entrance Stairs-Rails: Right Entrance Stairs-Number of Steps: 2 Home Layout: One level Home  Equipment: Walker - 2 wheels;Cane - single point      Prior Function Level of Independence: Independent with assistive device(s)         Comments: Pt amb and just has started using straight cane.     Hand Dominance   Dominant Hand: Right    Extremity/Trunk Assessment   Upper Extremity Assessment: Overall WFL for tasks assessed           Lower Extremity Assessment: RLE deficits/detail;LLE deficits/detail         Communication   Communication: HOH  Cognition Arousal/Alertness: Awake/alert Behavior During Therapy: WFL for tasks assessed/performed Overall Cognitive Status: No family/caregiver present to determine baseline cognitive functioning       Memory: Decreased short-term memory              General Comments      Exercises        Assessment/Plan    PT Assessment All further PT needs can be met in the next venue of care  PT Diagnosis Abnormality of gait   PT Problem List Decreased balance;Decreased knowledge of use of DME  PT Treatment Interventions     PT Goals (Current goals can be found in the Care Plan section) Acute Rehab PT Goals Patient Stated Goal: go home PT Goal Formulation: All assessment and education complete, DC therapy    Frequency     Barriers to discharge        Co-evaluation  End of Session   Activity Tolerance: Patient tolerated treatment well Patient left: in chair;with call bell/phone within reach           Time: 1135-1146 PT Time Calculation (min) (ACUTE ONLY): 11 min   Charges:   PT Evaluation $Initial PT Evaluation Tier I: 1 Procedure     PT G Codes:        Shaiann Mcmanamon Apr 01, 2014, 11:58 AM  Suanne Marker PT [phone]

## 2014-03-29 NOTE — Progress Notes (Signed)
Patient Name: Keith Ellis Date of Encounter: 03/29/2014  Active Problems:   CHF (congestive heart failure)   Acute on chronic diastolic CHF (congestive heart failure), NYHA class 3   Length of Stay: 3  SUBJECTIVE  Has good and bad days. Substantial diuresis, edema resolved. Often bradycardic. Rhythm appears to be slow atrial flutter with 3:1 and 4:1 AV block Borderline abnormal TSH and T3, normal FT4  CURRENT MEDS . apixaban  5 mg Oral BID  . clonazePAM  0.5 mg Oral Daily  . escitalopram  20 mg Oral Daily  . furosemide  80 mg Intravenous TID  . hydrALAZINE  10 mg Oral 3 times per day  . isosorbide mononitrate  15 mg Oral Daily  . multivitamin with minerals  1 tablet Oral Daily  . pantoprazole  40 mg Oral Daily  . potassium chloride SA  20 mEq Oral Daily  . sodium chloride  3 mL Intravenous Q12H    OBJECTIVE   Intake/Output Summary (Last 24 hours) at 03/29/14 1043 Last data filed at 03/29/14 0850  Gross per 24 hour  Intake   1140 ml  Output   2925 ml  Net  -1785 ml   Filed Weights   03/27/14 0351 03/28/14 0448 03/29/14 0635  Weight: 216 lb 14.9 oz (98.4 kg) 199 lb 1.6 oz (90.311 kg) 196 lb 9.6 oz (89.177 kg)    PHYSICAL EXAM Filed Vitals:   03/28/14 1300 03/28/14 1518 03/28/14 2155 03/29/14 0635  BP: 145/44 143/66 144/54 134/58  Pulse: 76 74 62 60  Temp: 97.9 F (36.6 C)  97.4 F (36.3 C) 98 F (36.7 C)  TempSrc: Oral  Oral Oral  Resp: 20  16 18   Height:      Weight:    196 lb 9.6 oz (89.177 kg)  SpO2: 98%  95% 93%   General: Alert, oriented x3, no distress Head: no evidence of trauma, PERRL, EOMI, no exophtalmos or lid lag, no myxedema, no xanthelasma; normal ears, nose and oropharynx Neck: normal jugular venous pulsations and no hepatojugular reflux; brisk carotid pulses without delay and no carotid bruits Chest: clear to auscultation, no signs of consolidation by percussion or palpation, normal fremitus, symmetrical and full respiratory  excursions Cardiovascular: normal position and quality of the apical impulse, irregular rhythm, normal first and second heart sounds, no rubs or gallops, 1-6/1 early systolic murmur Abdomen: no tenderness or distention, no masses by palpation, no abnormal pulsatility or arterial bruits, normal bowel sounds, no hepatosplenomegaly Extremities: no clubbing, cyanosis, trivial ankle edema; 2+ radial, ulnar and brachial pulses bilaterally; 2+ right femoral, posterior tibial and dorsalis pedis pulses; 2+ left femoral, posterior tibial and dorsalis pedis pulses; no subclavian or femoral bruits Neurological: grossly nonfocal  LABS  CBC  Recent Labs  03/26/14 1734 03/27/14 0120 03/28/14 1314  WBC 4.9 4.9 6.0  NEUTROABS 3.2  --   --   HGB 14.6 14.4 14.0  HCT 43.2 43.2 43.0  MCV 88.9 89.6 90.9  PLT 103* 104* 93*   Basic Metabolic Panel  Recent Labs  03/28/14 0615 03/29/14 0556  NA 139 139  K 4.0 3.5  CL 104 102  CO2 26 27  GLUCOSE 96 106*  BUN 19 18  CREATININE 1.42* 1.44*  CALCIUM 8.9 8.5  MG 2.3 2.2   Liver Function Tests No results for input(s): AST, ALT, ALKPHOS, BILITOT, PROT, ALBUMIN in the last 72 hours. No results for input(s): LIPASE, AMYLASE in the last 72 hours. Cardiac Enzymes  Recent Labs  03/26/14 1734  TROPONINI <0.03  Thyroid Function Tests  Recent Labs  03/28/14 0615  TSH 5.055*    Radiology Studies Imaging results have been reviewed and No results found.  TELE Persistent slow atrial flutter  ECG Atypical atrial flutter with 3:1 AV block, RBBB  ASSESSMENT AND PLAN  No overt evidence of hypervolemia, but he is still symptomatic. Evidence of both sinus node dysfunction and intraventricular conduction delay by current and previous ECG, despite no negative chronotropic agents Borderline hypothyroidism, likely not a major cause of arrhythmia. Will ask EP opinion for DC cardioversion and/or pacemaker therapy.  He is on apixaban since  2013.   Sanda Klein, MD, [facility] [phone] office [phone] pager 03/29/2014 10:43 AM

## 2014-03-30 ENCOUNTER — Encounter (HOSPITAL_COMMUNITY)
Admission: EM | Disposition: A | Discharge status: Home / Self Care | Payer: Self-pay | Source: Home / Self Care | Attending: Cardiovascular Disease

## 2014-03-30 DIAGNOSIS — I481 Persistent atrial fibrillation: Secondary | ICD-10-CM

## 2014-03-30 DIAGNOSIS — I495 Sick sinus syndrome: Secondary | ICD-10-CM

## 2014-03-30 DIAGNOSIS — I441 Atrioventricular block, second degree: Principal | ICD-10-CM

## 2014-03-30 HISTORY — PX: PERMANENT PACEMAKER INSERTION: SHX5480

## 2014-03-30 LAB — URINE CULTURE
Colony Count: NO GROWTH
Culture: NO GROWTH
Special Requests: NORMAL

## 2014-03-30 LAB — BASIC METABOLIC PANEL
ANION GAP: 10 (ref 5–15)
BUN: 21 mg/dL (ref 6–23)
CO2: 28 mmol/L (ref 19–32)
Calcium: 8.4 mg/dL (ref 8.4–10.5)
Chloride: 100 mmol/L (ref 96–112)
Creatinine, Ser: 1.41 mg/dL — ABNORMAL HIGH (ref 0.50–1.35)
GFR calc Af Amer: 52 mL/min — ABNORMAL LOW (ref >90)
GFR, EST NON AFRICAN AMERICAN: 45 mL/min — AB (ref >90)
Glucose, Bld: 106 mg/dL — ABNORMAL HIGH (ref 70–99)
POTASSIUM: 3.8 mmol/L (ref 3.5–5.1)
SODIUM: 138 mmol/L (ref 135–145)

## 2014-03-30 LAB — MAGNESIUM: Magnesium: 2.1 mg/dL (ref 1.5–2.5)

## 2014-03-30 SURGERY — PERMANENT PACEMAKER INSERTION

## 2014-03-30 MED ORDER — FUROSEMIDE 10 MG/ML IJ SOLN
80.0000 mg | Freq: Two times a day (BID) | INTRAMUSCULAR | Status: DC
  Administered 2014-03-31 (×2): 80 mg via INTRAVENOUS
  Filled 2014-03-30 (×5): qty 8

## 2014-03-30 MED ORDER — SODIUM CHLORIDE 0.9 % IR SOLN
80.0000 mg | Status: DC
  Filled 2014-03-30: qty 2

## 2014-03-30 MED ORDER — SODIUM CHLORIDE 0.9 % IJ SOLN: 3.0000 mL | INTRAMUSCULAR | Status: DC | PRN

## 2014-03-30 MED ORDER — LIDOCAINE HCL (PF) 1 % IJ SOLN
INTRAMUSCULAR | Status: AC
  Filled 2014-03-30: qty 30

## 2014-03-30 MED ORDER — CHLORHEXIDINE GLUCONATE 4 % EX LIQD: 60.0000 mL | Freq: Once | CUTANEOUS | Status: DC

## 2014-03-30 MED ORDER — MIDAZOLAM HCL 5 MG/5ML IJ SOLN
INTRAMUSCULAR | Status: AC
  Filled 2014-03-30: qty 5

## 2014-03-30 MED ORDER — ONDANSETRON HCL 4 MG/2ML IJ SOLN: 4.0000 mg | Freq: Four times a day (QID) | INTRAMUSCULAR | Status: DC | PRN

## 2014-03-30 MED ORDER — HEPARIN (PORCINE) IN NACL 2-0.9 UNIT/ML-% IJ SOLN
INTRAMUSCULAR | Status: AC
  Filled 2014-03-30: qty 500

## 2014-03-30 MED ORDER — CEFAZOLIN SODIUM-DEXTROSE 2-3 GM-% IV SOLR
2.0000 g | INTRAVENOUS | Status: DC
  Filled 2014-03-30: qty 50

## 2014-03-30 MED ORDER — HYDROCODONE-ACETAMINOPHEN 5-325 MG PO TABS
1.0000 | ORAL_TABLET | ORAL | Status: DC | PRN
  Administered 2014-03-31: 1 via ORAL
  Filled 2014-03-30: qty 1

## 2014-03-30 MED ORDER — ACETAMINOPHEN 325 MG PO TABS: 325.0000 mg | ORAL_TABLET | ORAL | Status: DC | PRN

## 2014-03-30 MED ORDER — FUROSEMIDE 10 MG/ML IJ SOLN: 80.0000 mg | Freq: Three times a day (TID) | INTRAMUSCULAR | Status: DC

## 2014-03-30 MED ORDER — CEFAZOLIN SODIUM 1-5 GM-% IV SOLN
1.0000 g | Freq: Four times a day (QID) | INTRAVENOUS | Status: AC
  Administered 2014-03-30 – 2014-03-31 (×3): 1 g via INTRAVENOUS
  Filled 2014-03-30 (×3): qty 50

## 2014-03-30 MED ORDER — FENTANYL CITRATE 0.05 MG/ML IJ SOLN
INTRAMUSCULAR | Status: AC
  Filled 2014-03-30: qty 2

## 2014-03-30 MED ORDER — SODIUM CHLORIDE 0.9 % IV SOLN: 250.0000 mL | INTRAVENOUS | Status: DC | PRN

## 2014-03-30 MED ORDER — SODIUM CHLORIDE 0.9 % IJ SOLN
3.0000 mL | Freq: Two times a day (BID) | INTRAMUSCULAR | Status: DC
  Administered 2014-03-30 – 2014-04-01 (×4): 3 mL via INTRAVENOUS

## 2014-03-30 MED ORDER — APIXABAN 5 MG PO TABS
5.0000 mg | ORAL_TABLET | Freq: Two times a day (BID) | ORAL | Status: DC
  Administered 2014-03-31 – 2014-04-01 (×3): 5 mg via ORAL
  Filled 2014-03-30 (×4): qty 1

## 2014-03-30 MED ORDER — SODIUM CHLORIDE 0.9 % IV SOLN: INTRAVENOUS | Status: DC

## 2014-03-30 NOTE — Progress Notes (Signed)
NP notified that pt converted to first degree.  Will continue to monitor.

## 2014-03-30 NOTE — H&P (View-Only) (Signed)
SUBJECTIVE: The patient is doing well today.  At this time, he denies chest pain.  He continues to have shortness of breath with exertion as well as dizziness with walking.   CURRENT MEDICATIONS: . apixaban  5 mg Oral BID  . clonazePAM  0.5 mg Oral Daily  . escitalopram  20 mg Oral Daily  . furosemide  80 mg Intravenous TID  . hydrALAZINE  10 mg Oral 3 times per day  . isosorbide mononitrate  15 mg Oral Daily  . multivitamin with minerals  1 tablet Oral Daily  . pantoprazole  40 mg Oral Daily  . potassium chloride SA  20 mEq Oral Daily  . sodium chloride  3 mL Intravenous Q12H      OBJECTIVE: Physical Exam: Filed Vitals:   03/29/14 0635 03/29/14 1443 03/29/14 2051 03/30/14 0549  BP: 134/58 133/42 135/58 137/62  Pulse: 60 63 66 54  Temp: 98 F (36.7 C) 98 F (36.7 C) 98.8 F (37.1 C) 98 F (36.7 C)  TempSrc: Oral Oral Oral Oral  Resp: 18 18 18 18   Height:      Weight: 196 lb 9.6 oz (89.177 kg)   196 lb 8 oz (89.132 kg)  SpO2: 93% 97% 96% 93%    Intake/Output Summary (Last 24 hours) at 03/30/14 1016 Last data filed at 03/30/14 0825  Gross per 24 hour  Intake    840 ml  Output   1710 ml  Net   -870 ml    Telemetry reveals atrial fibrillation with slow ventricular response in the 40's, rate low 50's with exertion  GEN- The patient is elderly appearing, alert and oriented x 3 today.   Head- normocephalic, atraumatic Eyes-  Sclera clear, conjunctiva pink Ears- HOH Oropharynx- clear Neck- supple, no JVP Lungs- Clear to ausculation bilaterally, normal work of breathing Heart- Bradycardic irregular rate and rhythm, 2/6 SEM GI- soft, NT, ND, + BS Extremities- no clubbing, cyanosis, or edema Skin- no rash or lesion Psych- euthymic mood, full affect Neuro- strength and sensation are intact  LABS: Basic Metabolic Panel:  Recent Labs  03/29/14 0556 03/30/14 0315  NA 139 138  K 3.5 3.8  CL 102 100  CO2 27 28  GLUCOSE 106* 106*  BUN 18 21  CREATININE  1.44* 1.41*  CALCIUM 8.5 8.4  MG 2.2 2.1   CBC:  Recent Labs  03/28/14 1314 03/29/14 1210  WBC 6.0 4.9  HGB 14.0 13.3  HCT 43.0 40.1  MCV 90.9 89.1  PLT 93* 87*   Thyroid Function Tests:  Recent Labs  03/28/14 0615  TSH 5.055*    RADIOLOGY: Dg Chest 2 View 03/26/2014   CLINICAL DATA:  Chest pain. Congestive heart failure. Edema and orthopnea.  EXAM: CHEST - 2 VIEW  COMPARISON:  Two-view chest 07/06/2013.  FINDINGS: The cardiopericardial silhouette has enlarged since prior exam. Atherosclerotic calcifications are again noted at the aorta. Mild chronic interstitial coarsening is present. A new right pleural effusion is present. Right basilar airspace disease likely reflects atelectasis. The left lung base is clear.  IMPRESSION: 1. Interval enlargement of the cardiopericardial shadow. A pericardial effusion the knee is considered. Echocardiography may be useful further evaluation. 2. Atherosclerosis. 3. New right pleural effusion. Associated basilar airspace disease likely reflects atelectasis. This could be related to congestive heart failure although there is no significant edema. Infection is also considered.  These results will be called to the ordering clinician or representative by the Radiologist Assistant, and communication documented in the  PACS or zVision Dashboard.   Electronically Signed   By: San Morelle M.D.   On: 03/26/2014 16:34    ASSESSMENT AND PLAN:  Principal Problem:   Acute on chronic diastolic CHF (congestive heart failure), NYHA class 3 Active Problems:   Hypertension   CAD- mild disease 2007   Hyperlipidemia   Aortic stenosis-mild   Atrial flutter- recurrent with slow VR   Chronic anticoagulation   BPH (benign prostatic hypertrophy) with urinary retention   Syncope- 2 episodes, two weeks ago   Chronic renal disease, stage III   RBBB   Bradycardia  1.  Symptomatic bradycardia/syncope The patient has symptomatic bradycardia with prior syncope.   His syncopal spells occurred without prodrome and he had no residual symptoms afterwards.  He has underlying conduction system disease with RBBB and 1st degree heart block.  He has had bradycardia this admission with ventricular rates in the 40's persistently associated with dizziness and shortness of breath with exertion.   He has no reversible causes for bradycardia identified.  Will discuss with Dr Rayann Heman, tentative plan for PPM this afternoon.  2.  Atrial fibrillation Continue Eliquis for CHADS2VASC score of at least 4 Pt unaware of AF Would pursue rhythm control with diastolic heart failure  3.  NSVT Asymptomatic In the setting of normal EF Would recommend BB post pacemaker implantation  4.  Acute on chronic diastolic dysfunction Improved after diuresis I/O -3.4L this admission Change IV Lasix to po today Continue Hydralazine/Imdur  5.  HTN Stable No change required today  6.  CKD Stable No change required today  7.  Elevated TSH FT4 normal Will check FT3 (total drawn)  Chanetta Marshall, NP 03/30/2014 12:10 PM   I have seen, examined the patient, and reviewed the above assessment and plan.  He is now back in sinus.  RRR on exam. Changes to above are made where necessary.   The patient has symptomatic sinus bradycardia as well as symtomatic bradycardia with his afib..  I would therefore recommend pacemaker implantation at this time.  Risks, benefits, alternatives to pacemaker implantation were discussed in detail with the patient today. The patient understands that the risks include but are not limited to bleeding, infection, pneumothorax, perforation, tamponade, vascular damage, renal failure, MI, stroke, death,  and lead dislodgement and wishes to proceed. We will therefore schedule the procedure later today.   Co Sign: [name], MD 03/30/2014 2:50 PM

## 2014-03-30 NOTE — Interval H&P Note (Signed)
History and Physical Interval Note:  03/30/2014 3:46 PM  Keith Ellis  has presented today for surgery, with the diagnosis of bradicardia  The various methods of treatment have been discussed with the patient and family. After consideration of risks, benefits and other options for treatment, the patient has consented to  Procedure(s): PERMANENT PACEMAKER INSERTION (N/A) as a surgical intervention .  The patient's history has been reviewed, patient examined, no change in status, stable for surgery.  I have reviewed the patient's chart and labs.  Questions were answered to the patient's satisfaction.     [name]

## 2014-03-30 NOTE — Progress Notes (Signed)
SUBJECTIVE: The patient is doing well today.  At this time, he denies chest pain.  He continues to have shortness of breath with exertion as well as dizziness with walking.   CURRENT MEDICATIONS: . apixaban  5 mg Oral BID  . clonazePAM  0.5 mg Oral Daily  . escitalopram  20 mg Oral Daily  . furosemide  80 mg Intravenous TID  . hydrALAZINE  10 mg Oral 3 times per day  . isosorbide mononitrate  15 mg Oral Daily  . multivitamin with minerals  1 tablet Oral Daily  . pantoprazole  40 mg Oral Daily  . potassium chloride SA  20 mEq Oral Daily  . sodium chloride  3 mL Intravenous Q12H      OBJECTIVE: Physical Exam: Filed Vitals:   03/29/14 0635 03/29/14 1443 03/29/14 2051 03/30/14 0549  BP: 134/58 133/42 135/58 137/62  Pulse: 60 63 66 54  Temp: 98 F (36.7 C) 98 F (36.7 C) 98.8 F (37.1 C) 98 F (36.7 C)  TempSrc: Oral Oral Oral Oral  Resp: 18 18 18 18   Height:      Weight: 196 lb 9.6 oz (89.177 kg)   196 lb 8 oz (89.132 kg)  SpO2: 93% 97% 96% 93%    Intake/Output Summary (Last 24 hours) at 03/30/14 1016 Last data filed at 03/30/14 0825  Gross per 24 hour  Intake    840 ml  Output   1710 ml  Net   -870 ml    Telemetry reveals atrial fibrillation with slow ventricular response in the 40's, rate low 50's with exertion  GEN- The patient is elderly appearing, alert and oriented x 3 today.   Head- normocephalic, atraumatic Eyes-  Sclera clear, conjunctiva pink Ears- HOH Oropharynx- clear Neck- supple, no JVP Lungs- Clear to ausculation bilaterally, normal work of breathing Heart- Bradycardic irregular rate and rhythm, 2/6 SEM GI- soft, NT, ND, + BS Extremities- no clubbing, cyanosis, or edema Skin- no rash or lesion Psych- euthymic mood, full affect Neuro- strength and sensation are intact  LABS: Basic Metabolic Panel:  Recent Labs  03/29/14 0556 03/30/14 0315  NA 139 138  K 3.5 3.8  CL 102 100  CO2 27 28  GLUCOSE 106* 106*  BUN 18 21  CREATININE  1.44* 1.41*  CALCIUM 8.5 8.4  MG 2.2 2.1   CBC:  Recent Labs  03/28/14 1314 03/29/14 1210  WBC 6.0 4.9  HGB 14.0 13.3  HCT 43.0 40.1  MCV 90.9 89.1  PLT 93* 87*   Thyroid Function Tests:  Recent Labs  03/28/14 0615  TSH 5.055*    RADIOLOGY: Dg Chest 2 View 03/26/2014   CLINICAL DATA:  Chest pain. Congestive heart failure. Edema and orthopnea.  EXAM: CHEST - 2 VIEW  COMPARISON:  Two-view chest 07/06/2013.  FINDINGS: The cardiopericardial silhouette has enlarged since prior exam. Atherosclerotic calcifications are again noted at the aorta. Mild chronic interstitial coarsening is present. A new right pleural effusion is present. Right basilar airspace disease likely reflects atelectasis. The left lung base is clear.  IMPRESSION: 1. Interval enlargement of the cardiopericardial shadow. A pericardial effusion the knee is considered. Echocardiography may be useful further evaluation. 2. Atherosclerosis. 3. New right pleural effusion. Associated basilar airspace disease likely reflects atelectasis. This could be related to congestive heart failure although there is no significant edema. Infection is also considered.  These results will be called to the ordering clinician or representative by the Radiologist Assistant, and communication documented in the  PACS or zVision Dashboard.   Electronically Signed   By: San Morelle M.D.   On: 03/26/2014 16:34    ASSESSMENT AND PLAN:  Principal Problem:   Acute on chronic diastolic CHF (congestive heart failure), NYHA class 3 Active Problems:   Hypertension   CAD- mild disease 2007   Hyperlipidemia   Aortic stenosis-mild   Atrial flutter- recurrent with slow VR   Chronic anticoagulation   BPH (benign prostatic hypertrophy) with urinary retention   Syncope- 2 episodes, two weeks ago   Chronic renal disease, stage III   RBBB   Bradycardia  1.  Symptomatic bradycardia/syncope The patient has symptomatic bradycardia with prior syncope.   His syncopal spells occurred without prodrome and he had no residual symptoms afterwards.  He has underlying conduction system disease with RBBB and 1st degree heart block.  He has had bradycardia this admission with ventricular rates in the 40's persistently associated with dizziness and shortness of breath with exertion.   He has no reversible causes for bradycardia identified.  Will discuss with Dr Rayann Heman, tentative plan for PPM this afternoon.  2.  Atrial fibrillation Continue Eliquis for CHADS2VASC score of at least 4 Pt unaware of AF Would pursue rhythm control with diastolic heart failure  3.  NSVT Asymptomatic In the setting of normal EF Would recommend BB post pacemaker implantation  4.  Acute on chronic diastolic dysfunction Improved after diuresis I/O -3.4L this admission Change IV Lasix to po today Continue Hydralazine/Imdur  5.  HTN Stable No change required today  6.  CKD Stable No change required today  7.  Elevated TSH FT4 normal Will check FT3 (total drawn)  Chanetta Marshall, NP 03/30/2014 12:10 PM   I have seen, examined the patient, and reviewed the above assessment and plan.  He is now back in sinus.  RRR on exam. Changes to above are made where necessary.   The patient has symptomatic sinus bradycardia as well as symtomatic bradycardia with his afib..  I would therefore recommend pacemaker implantation at this time.  Risks, benefits, alternatives to pacemaker implantation were discussed in detail with the patient today. The patient understands that the risks include but are not limited to bleeding, infection, pneumothorax, perforation, tamponade, vascular damage, renal failure, MI, stroke, death,  and lead dislodgement and wishes to proceed. We will therefore schedule the procedure later today.   Co Sign: [name], MD 03/30/2014 2:50 PM

## 2014-03-30 NOTE — Op Note (Signed)
SURGEON:  [name], MD     PREPROCEDURE DIAGNOSIS:  Symptomatic sinus bradycardia and second degree AV block    POSTPROCEDURE DIAGNOSIS:  Symptomatic sinus bradycardia and second degree AV block     PROCEDURES:   1. Left upper extremity venography.   2. Pacemaker implantation.     INTRODUCTION: Keith Ellis is a 81 y.o. male  with a history of  sinus bradycardia and second degree AV block who presents today for pacemaker implantation.  The patient reports intermittent episodes of fatigue and SOB over the past few months.  He has also had syncope.  He is found to have sinus bradycardia as the cause.  When in afib, he also has second degree AV block with similar symptoms.  No reversible causes have been identified.  The patient therefore presents today for pacemaker implantation.     DESCRIPTION OF PROCEDURE:  Informed written consent was obtained, and the patient was brought to the electrophysiology lab in a fasting state.  The patient required no sedation for the procedure today.  The patients left chest was prepped and draped in the usual sterile fashion by the EP lab staff. The skin overlying the left deltopectoral region was infiltrated with lidocaine for local analgesia.  A 4-cm incision was made over the left deltopectoral region.  A left subcutaneous pacemaker pocket was fashioned using a combination of sharp and blunt dissection. Electrocautery was required to assure hemostasis.    Left Upper Extremity Venography: A venogram of the left upper extremity was performed, which revealed a moderate sized left cephalic vein, which emptied into a large left subclavian vein.  The left axillary vein was moderate in size.    RA/RV Lead Placement: The left axillary vein was cannulated.  Through the left axillary vein, a Medtronic model [id number] (serial number [id number]) right atrial lead and a Medtronic model 5076- 58 (serial number [id number]) right ventricular lead were advanced with  fluoroscopic visualization into the right atrial appendage and right ventricular apical septum positions respectively.  Initial atrial lead P- waves measured 1.7 mV with impedance of 716 ohms and a threshold of 1.2 V at 0.5 msec.  Right ventricular lead R-waves measured 7.7 mV with an impedance of 759 ohms and a threshold of 1.2 V at 0.5 msec.  Both leads were secured to the pectoralis fascia using #2-0 silk over the suture sleeves.   Device Placement:  The leads were then connected to a Medtronic Adapta L model ADDRL 1 (serial number [id number]) pacemaker.  The pocket was irrigated with copious gentamicin solution.  The pacemaker was then placed into the pocket.  The pocket was then closed in 2 layers with 2.0 Vicryl suture for the subcutaneous and subcuticular layers.  Steri-  Strips and a sterile dressing were then applied. EBL<60ml. There were no early apparent complications.     CONCLUSIONS:   1. Successful implantation of a Medtronic Adapta L dual-chamber pacemaker for symptomatic sinus bradycardia and second degree AV block  2. No early apparent complications.      Permanent Pacemaker Indication: Documented non-reversible symptomatic bradycardia due to sinus node dysfunction.     [name], MD 03/30/2014 5:27 PM

## 2014-03-31 ENCOUNTER — Inpatient Hospital Stay (HOSPITAL_COMMUNITY): Payer: Medicare HMO

## 2014-03-31 ENCOUNTER — Encounter (HOSPITAL_COMMUNITY): Payer: Self-pay | Admitting: Internal Medicine

## 2014-03-31 LAB — BASIC METABOLIC PANEL
Anion gap: 11 (ref 5–15)
BUN: 23 mg/dL (ref 6–23)
CHLORIDE: 100 mmol/L (ref 96–112)
CO2: 25 mmol/L (ref 19–32)
Calcium: 8.6 mg/dL (ref 8.4–10.5)
Creatinine, Ser: 1.38 mg/dL — ABNORMAL HIGH (ref 0.50–1.35)
GFR calc Af Amer: 53 mL/min — ABNORMAL LOW (ref >90)
GFR calc non Af Amer: 46 mL/min — ABNORMAL LOW (ref >90)
GLUCOSE: 106 mg/dL — AB (ref 70–99)
POTASSIUM: 3.6 mmol/L (ref 3.5–5.1)
SODIUM: 136 mmol/L (ref 135–145)

## 2014-03-31 LAB — MAGNESIUM: Magnesium: 2.2 mg/dL (ref 1.5–2.5)

## 2014-03-31 NOTE — Telephone Encounter (Signed)
Left message on machine to call back  

## 2014-03-31 NOTE — Progress Notes (Addendum)
SUBJECTIVE: The patient is doing well today.  Still having some SOB. At this time, he denies chest pain or any new concerns.  Marland Kitchen apixaban  5 mg Oral BID  .  ceFAZolin (ANCEF) IV  1 g Intravenous Q6H  . clonazePAM  0.5 mg Oral Daily  . escitalopram  20 mg Oral Daily  . furosemide  80 mg Intravenous BID  . hydrALAZINE  10 mg Oral 3 times per day  . isosorbide mononitrate  15 mg Oral Daily  . multivitamin with minerals  1 tablet Oral Daily  . pantoprazole  40 mg Oral Daily  . potassium chloride SA  20 mEq Oral Daily  . sodium chloride  3 mL Intravenous Q12H      OBJECTIVE: Physical Exam: Filed Vitals:   03/30/14 1755 03/30/14 2100 03/31/14 0153 03/31/14 0536  BP: 133/51 136/47 139/61 132/70  Pulse: 60 72 70 72  Temp:  97.6 F (36.4 C) 97.3 F (36.3 C) 97.4 F (36.3 C)  TempSrc:  Oral Oral Oral  Resp:   18 18  Height:      Weight:    196 lb 3.4 oz (89 kg)  SpO2:  100% 100% 100%    Intake/Output Summary (Last 24 hours) at 03/31/14 0644 Last data filed at 03/31/14 0526  Gross per 24 hour  Intake    760 ml  Output   1170 ml  Net   -410 ml    Telemetry reveals AV paced  GEN- The patient is well appearing, alert and oriented x 3 today.   Head- normocephalic, atraumatic Eyes-  Sclera clear, conjunctiva pink Ears- hearing intact Oropharynx- clear Neck- supple, no JVP Lymph- no cervical lymphadenopathy Lungs- Clear to ausculation bilaterally, normal work of breathing Heart- Regular rate and rhythm  GI- soft, NT, ND, + BS Extremities- no clubbing, cyanosis, or edema Skin- pacemaker pocket is without hematoma LABS: Basic Metabolic Panel:  Recent Labs  03/30/14 0315 03/31/14 0419  NA 138 136  K 3.8 3.6  CL 100 100  CO2 28 25  GLUCOSE 106* 106*  BUN 21 23  CREATININE 1.41* 1.38*  CALCIUM 8.4 8.6  MG 2.1 2.2   Liver Function Tests: No results for input(s): AST, ALT, ALKPHOS, BILITOT, PROT, ALBUMIN in the last 72 hours. No results for input(s): LIPASE,  AMYLASE in the last 72 hours. CBC:  Recent Labs  03/28/14 1314 03/29/14 1210  WBC 6.0 4.9  HGB 14.0 13.3  HCT 43.0 40.1  MCV 90.9 89.1  PLT 93* 87*   ASSESSMENT AND PLAN:  Principal Problem:   Acute on chronic diastolic CHF (congestive heart failure), NYHA class 3 Active Problems:   Hypertension   CAD- mild disease 2007   Hyperlipidemia   Aortic stenosis-mild   Atrial flutter- recurrent with slow VR   Chronic anticoagulation   BPH (benign prostatic hypertrophy) with urinary retention   Syncope- 2 episodes, two weeks ago   Chronic renal disease, stage III   RBBB   Bradycardia   1. Sick sinus/ second degree AV block Normal pacemaker function CXR reveals stable leads, no ptx  2. afib Maintaining sinus rhythm Continue eliquis  3. Acute on chronic diastolic dysfunction Remains SOB Continue IV lasix for now  Possibly home later today or tomorrow  Electrophysiology team to see as needed while here. Please call with questions.    [name], MD 03/31/2014 6:44 AM   Device interrogation is reviewed and normal [name] MD, [facility] 03/31/2014 8:07 AM

## 2014-03-31 NOTE — Progress Notes (Signed)
Patient alert oriented, denies pain, patient have Shortness of breath with activities, oxygen saturation is WNL  On room air. Will continue to monitor patient.

## 2014-03-31 NOTE — Progress Notes (Signed)
Kindly disregard the EKG done at 0518 (03/31/14)

## 2014-03-31 NOTE — Progress Notes (Signed)
Pt complaining of soreness to left  Chest wall pacemaker incision site.pain 8/10. vicodin given.

## 2014-04-01 LAB — BASIC METABOLIC PANEL
ANION GAP: 11 (ref 5–15)
BUN: 26 mg/dL — ABNORMAL HIGH (ref 6–23)
CHLORIDE: 100 mmol/L (ref 96–112)
CO2: 24 mmol/L (ref 19–32)
Calcium: 8.8 mg/dL (ref 8.4–10.5)
Creatinine, Ser: 1.29 mg/dL (ref 0.50–1.35)
GFR calc Af Amer: 58 mL/min — ABNORMAL LOW (ref >90)
GFR calc non Af Amer: 50 mL/min — ABNORMAL LOW (ref >90)
Glucose, Bld: 121 mg/dL — ABNORMAL HIGH (ref 70–99)
POTASSIUM: 3.4 mmol/L — AB (ref 3.5–5.1)
Sodium: 135 mmol/L (ref 135–145)

## 2014-04-01 LAB — CBC
HCT: 40.4 % (ref 39.0–52.0)
Hemoglobin: 13.4 g/dL (ref 13.0–17.0)
MCH: 29.3 pg (ref 26.0–34.0)
MCHC: 33.2 g/dL (ref 30.0–36.0)
MCV: 88.2 fL (ref 78.0–100.0)
PLATELETS: 96 10*3/uL — AB (ref 150–400)
RBC: 4.58 MIL/uL (ref 4.22–5.81)
RDW: 16.3 % — AB (ref 11.5–15.5)
WBC: 6.5 10*3/uL (ref 4.0–10.5)

## 2014-04-01 LAB — MAGNESIUM: Magnesium: 2.2 mg/dL (ref 1.5–2.5)

## 2014-04-01 LAB — T3, FREE: T3, Free: 1.9 pg/mL — ABNORMAL LOW (ref 2.0–4.4)

## 2014-04-01 MED ORDER — HYDRALAZINE HCL 10 MG PO TABS: 10.0000 mg | ORAL_TABLET | Freq: Three times a day (TID) | ORAL | Status: DC

## 2014-04-01 MED ORDER — TORSEMIDE 20 MG PO TABS: ORAL_TABLET | ORAL | Status: DC

## 2014-04-01 MED ORDER — ISOSORBIDE MONONITRATE ER 30 MG PO TB24: 15.0000 mg | ORAL_TABLET | Freq: Every day | ORAL | Status: DC

## 2014-04-01 MED ORDER — TORSEMIDE 20 MG PO TABS
20.0000 mg | ORAL_TABLET | Freq: Two times a day (BID) | ORAL | Status: DC
  Filled 2014-04-01 (×2): qty 1

## 2014-04-01 MED ORDER — POTASSIUM CHLORIDE CRYS ER 20 MEQ PO TBCR: 20.0000 meq | EXTENDED_RELEASE_TABLET | Freq: Every day | ORAL | Status: DC

## 2014-04-01 NOTE — Progress Notes (Signed)
Ambulated patient with sats being monitored. Sats did not fall below 91%. Resting sats were 95% on room air. Isaac Laud PA notified.

## 2014-04-01 NOTE — Discharge Summary (Signed)
Discharge Summary   Patient ID: Keith Ellis,  MRN: [id number], DOB/AGE: 04-23-1929 81 y.o.  Admit date: 03/26/2014 Discharge date: 04/01/2014  Primary Care Provider: [name] Primary Cardiologist: Dr. Percival Spanish Primary Electrophysiologist: Dr. Allred/Dr. Lovena Le  Discharge Diagnoses Principal Problem:   Acute on chronic diastolic CHF (congestive heart failure), NYHA class 3 Active Problems:   Hypertension   CAD- mild disease 2007   Hyperlipidemia   Aortic stenosis-mild   Atrial flutter- recurrent with slow VR   Chronic anticoagulation   BPH (benign prostatic hypertrophy) with urinary retention   Syncope- 2 episodes, two weeks ago   Chronic renal disease, stage III   RBBB   Bradycardia   Allergies Allergies  Allergen Reactions  . Menthol Hives    Procedures  Echocardiogram 03/27/2014 LV EF: 60% -  65%  ------------------------------------------------------------------- Indications:   Pericardial effusion 423.9.  ------------------------------------------------------------------- History:  PMH:  Atrial flutter. Coronary artery disease. Congestive heart failure. Aortic valve disease. Risk factors: Hypertension.  ------------------------------------------------------------------- Study Conclusions  - Left ventricle: The cavity size was normal. Wall thickness was normal. Systolic function was normal. The estimated ejection fraction was in the range of 60% to 65%. Wall motion was normal; there were no regional wall motion abnormalities. Features are consistent with a pseudonormal left ventricular filling pattern, with concomitant abnormal relaxation and increased filling pressure (grade 2 diastolic dysfunction). - Aortic valve: Mildly calcified annulus. Trileaflet; mildly thickened leaflets. Valve area (VTI): 2.11 cm^2. Valve area (Vmax): 2.14 cm^2. Valve area (Vmean): 2.17 cm^2. - Mitral valve: Mildly calcified annulus. Mildly  thickened leaflets . There was mild regurgitation. - Left atrium: The atrium was severely dilated. - Pulmonary veins: There is systolic blunting of pulmonary vein flow suggesting elevated LA pressures. - Right ventricle: The interventricular septum is flattened in diastole suggesting RV volume overload. The cavity size was moderately to severely dilated. Systolic function was normal. RV TAPSE is 2.0 cm. - Right atrium: The atrium was severely dilated. - Tricuspid valve: There was moderate-severe regurgitation. TR VC is 0.7 cm. - Pulmonary arteries: Systolic pressure was moderately increased. PA peak pressure: 49 mm Hg (S). - Inferior vena cava: The vessel was dilated. The respirophasic diameter changes were blunted (< 50%), consistent with elevated central venous pressure. - Technically adequate study.     Dual Chamber Medtronic PPM placement 03/30/2014   Device: Medtronic Adapta L model ADDRL 1 (serial number [id number]) pacemaker Leads: Medtronic model [id number] (serial number [id number]) right atrial lead and a Medtronic model 5076- 58 (serial number [id number]) right ventricular lead   CONCLUSIONS:  1. Successful implantation of a Medtronic Adapta L dual-chamber pacemaker for symptomatic sinus bradycardia and second degree AV block 2. No early apparent complications.    Permanent Pacemaker Indication: Documented non-reversible symptomatic bradycardia due to sinus node dysfunction.    Hospital Course  The patient is a pleasant 81 year old Caucasian male with PMH of CAD, HTN, history of atrial fibrillation/flutter, bradycardia, hyperlipidemia and mild AS who presented to [facility] on 03/26/2014 with acute on chronic diastolic heart failure. He reported 10 pounds weight gain in the last 2 days and increasing dyspnea on exertion. He notices this when he has to stop on his return from walking to the mailbox. He also noted lower extremity edema and  increased abdominal distention. Per patient, he has been compliant with his torsemide twice a day at home, however he has not been monitoring his sodium or fluid intake. On arrival, his heart rate was maintaining in the 40s. Initial  EKG reveals the patient is here atrial fibrillation with slow ventricular response. Upon further questioning, his symptoms patient has frequent dizziness and 2 previous syncopal episode in recent weeks.  He was diuresed aggressively for acute on chronic diastolic heart failure. AV nodal blocking agent was avoided given his bradycardia. Echocardiogram obtained on 03/27/2014 showed EF 62-26%, grade 2 diastolic dysfunction, interventricular septum flattening suggesting RV volume overload, moderately to severely dilated RV, moderate to severe tricuspid regurg, severely dilated left atrium, PA peak pressure 49 mmHg. He had borderline abnormal TSH and T3, however normal free T4. Patient had good urinary output on IV Lasix. Electrophysiology was consulted on 03/29/2014 for atrial fibrillation with slow ventricular response and recurrence of atrial fibrillation. Patient was observed for an additional 24 hours, he was rounded by electrophysiology team on 03/28/2014, who felt that his recent syncope is likely driven by slow ventricular response. After discussing various options, it was decided for the patient to undergo permanent pacemaker placement. He underwent Medtronic dual-chamber pacemaker placement on 03/30/2014 for symptomatic bradycardia and second degree AV block, see detail on pacemaker above. Patient was seen on the following day by the EP team at which time his pacemaker showed abnormal function. Chest x-ray report revealed stable lead with no sign of pneumothorax. He was continued on IV Lasix for further diuresis.  He was seen in the morning of 04/01/2014, at which time he denies any previous shortness of breath or chest discomfort. Further emphasis has been placed on compliance with  low-salt diet and fluid restriction. Patient has been instructed to weigh himself everyday, and contact cardiology if his weight increased by more than 3 pounds overnight or 5 pounds in a single week to potentially increase his Demadex to 40mg  in AM and 20 mg in PM. Per PT evaluation, patient will need outpatient PT after discharge. We have also arranged for the patient to get a cane to help with ambulation. He ambulated prior to discharge without significant drop in O2 saturation. I will arrange 1-2 week wound check before follow-up with his cardiologist. Patient will also need to follow-up with Dr. Rayann Heman after pacemaker implantation.  Of note, during this admission, hydralazine 10 mg every 8 hours and Imdur 15 mg daily has been added to his medical regimen. He has been transitioned to PO lasix on 3/24, his current dry weight is 194 lb down from 216, he is Net -4L since admission.   Discharge Vitals Blood pressure 148/59, pulse 60, temperature 98 F (36.7 C), temperature source Oral, resp. rate 18, height 5\' 7"  (1.702 m), weight 194 lb 14.2 oz (88.4 kg), SpO2 99 %.  Filed Weights   03/30/14 0549 03/31/14 0536 04/01/14 0420  Weight: 196 lb 8 oz (89.132 kg) 196 lb 3.4 oz (89 kg) 194 lb 14.2 oz (88.4 kg)    Labs  CBC  Recent Labs  04/01/14 0535  WBC 6.5  HGB 13.4  HCT 40.4  MCV 88.2  PLT 96*   Basic Metabolic Panel  Recent Labs  03/31/14 0419 04/01/14 0535  NA 136 135  K 3.6 3.4*  CL 100 100  CO2 25 24  GLUCOSE 106* 121*  BUN 23 26*  CREATININE 1.38* 1.29  CALCIUM 8.6 8.8  MG 2.2 2.2   Thyroid Function Tests  Recent Labs  03/31/14 0419  T3FREE 1.9*    Disposition  Pt is being discharged home today in good condition.  Follow-up Plans & Appointments      Follow-up Information    Follow  up with [facility] On 04/15/2014.   Specialty:  Cardiology   Why:  11:30am. Device clinic wound check.   Contact information:   [address] [place] [phone]      Follow up with Minus Breeding, MD On 04/05/2014.   Specialty:  Cardiology   Why:  2:45pm.   Contact information:   [place] [address] [phone]       Follow up with [name], MD On 06/28/2014.   Specialty:  Cardiology   Why:  11:00am   Contact information:   [place] [address] [phone]       Discharge Medications    Medication List    TAKE these medications        acetaminophen-codeine 300-60 MG per tablet  Commonly known as:  TYLENOL #4  Take 1 tablet by mouth every 4 (four) hours as needed for moderate pain. 1/2 to 1 every 6 hours as needed for pain     B-12 PO  Take 1 capsule by mouth daily.     clonazePAM 0.5 MG tablet  Commonly known as:  KLONOPIN  Take 1 tablet (0.5 mg total) by mouth 2 (two) times daily as needed for anxiety.     Coconut Oil 1000 MG Caps  Take 1,000 mg by mouth 2 (two) times daily.     Cod Liver Oil Caps  Take 1 capsule by mouth daily.     ELDERBERRY PO  Take 1 capsule by mouth daily.     ELIQUIS 5 MG Tabs tablet  Generic drug:  apixaban  TAKE ONE TABLET BY MOUTH TWICE DAILY     escitalopram 20 MG tablet  Commonly known as:  LEXAPRO  Take 1 tablet (20 mg total) by mouth daily.     hydrALAZINE 10 MG tablet  Commonly known as:  APRESOLINE  Take 1 tablet (10 mg total) by mouth every 8 (eight) hours.     isosorbide mononitrate 30 MG 24 hr tablet  Commonly known as:  IMDUR  Take 0.5 tablets (15 mg total) by mouth daily.     multivitamin with minerals Tabs tablet  Take 1 tablet by mouth daily.     omeprazole 40 MG capsule  Commonly known as:  PRILOSEC  Take 1 capsule (40 mg total) by mouth daily.     potassium chloride SA 20 MEQ tablet  Commonly known as:  K-DUR,KLOR-CON  Take 1 tablet (20 mEq total) by mouth daily. Along with lasix     torsemide 20 MG tablet  Commonly known as:  DEMADEX  TAKE ONE  TABLET BY MOUTH TWICE DAILY        Duration of Discharge Encounter   Greater than 30 minutes including physician time.  Hilbert Corrigan PA-C Pager: [phone] 04/01/2014, 12:24 PM

## 2014-04-01 NOTE — Progress Notes (Signed)
Pt discharged home. Med orders and discharge orders reviewed with patient and family. Verbalizes understanding.

## 2014-04-01 NOTE — Progress Notes (Signed)
Patient Name: Keith Ellis Date of Encounter: 04/01/2014  Primary Cardiologist: Dr. Percival Spanish   Principal Problem:   Acute on chronic diastolic CHF (congestive heart failure), NYHA class 3 Active Problems:   Hypertension   CAD- mild disease 2007   Hyperlipidemia   Aortic stenosis-mild   Atrial flutter- recurrent with slow VR   Chronic anticoagulation   BPH (benign prostatic hypertrophy) with urinary retention   Syncope- 2 episodes, two weeks ago   Chronic renal disease, stage III   RBBB   Bradycardia    SUBJECTIVE  Denies any CP or SOB. Mild SOB yesterday which has since resolved. Continue to have occasional dizziness  CURRENT MEDS . apixaban  5 mg Oral BID  . clonazePAM  0.5 mg Oral Daily  . escitalopram  20 mg Oral Daily  . furosemide  80 mg Intravenous BID  . hydrALAZINE  10 mg Oral 3 times per day  . isosorbide mononitrate  15 mg Oral Daily  . multivitamin with minerals  1 tablet Oral Daily  . pantoprazole  40 mg Oral Daily  . potassium chloride SA  20 mEq Oral Daily  . sodium chloride  3 mL Intravenous Q12H    OBJECTIVE  Filed Vitals:   03/31/14 1448 03/31/14 1900 03/31/14 2122 04/01/14 0420  BP: 142/51 143/40 155/67 142/55  Pulse: 60 57 61 59  Temp: 97.7 F (36.5 C) 98.1 F (36.7 C) 98.1 F (36.7 C) 98 F (36.7 C)  TempSrc: Oral Oral Oral Oral  Resp: 20 18 18 18   Height:      Weight:    194 lb 14.2 oz (88.4 kg)  SpO2: 95% 98% 95% 95%    Intake/Output Summary (Last 24 hours) at 04/01/14 0930 Last data filed at 04/01/14 0918  Gross per 24 hour  Intake   1060 ml  Output   1675 ml  Net   -615 ml   Filed Weights   03/30/14 0549 03/31/14 0536 04/01/14 0420  Weight: 196 lb 8 oz (89.132 kg) 196 lb 3.4 oz (89 kg) 194 lb 14.2 oz (88.4 kg)    PHYSICAL EXAM  General: Pleasant, NAD. Neuro: Alert and oriented X 3. Moves all extremities spontaneously. Psych: Normal affect. HEENT:  Normal  Neck: Supple without bruits  + JVD more noticeable on  R. Lungs:  Resp regular and unlabored, CTA. Heart: RRR no s3, s4, or murmurs. Abdomen: Soft, non-tender, non-distended, BS + x 4.  Extremities: No clubbing, cyanosis. Skin discoloration of LE, wound placed on dressing. No significant edema  Accessory Clinical Findings  CBC  Recent Labs  03/29/14 1210 04/01/14 0535  WBC 4.9 6.5  HGB 13.3 13.4  HCT 40.1 40.4  MCV 89.1 88.2  PLT 87* 96*   Basic Metabolic Panel  Recent Labs  03/31/14 0419 04/01/14 0535  NA 136 135  K 3.6 3.4*  CL 100 100  CO2 25 24  GLUCOSE 106* 121*  BUN 23 26*  CREATININE 1.38* 1.29  CALCIUM 8.6 8.8  MG 2.2 2.2   Thyroid Function Tests  Recent Labs  03/31/14 0419  T3FREE 1.9*    TELE AV paced rhythm alternating with NSR    ECG  No new EKG  Echocardiogram 03/27/2014  LV EF: 60% -  65%  ------------------------------------------------------------------- Indications:   Pericardial effusion 423.9.  ------------------------------------------------------------------- History:  PMH:  Atrial flutter. Coronary artery disease. Congestive heart failure. Aortic valve disease. Risk factors: Hypertension.  ------------------------------------------------------------------- Study Conclusions  - Left ventricle: The cavity size was  normal. Wall thickness was normal. Systolic function was normal. The estimated ejection fraction was in the range of 60% to 65%. Wall motion was normal; there were no regional wall motion abnormalities. Features are consistent with a pseudonormal left ventricular filling pattern, with concomitant abnormal relaxation and increased filling pressure (grade 2 diastolic dysfunction). - Aortic valve: Mildly calcified annulus. Trileaflet; mildly thickened leaflets. Valve area (VTI): 2.11 cm^2. Valve area (Vmax): 2.14 cm^2. Valve area (Vmean): 2.17 cm^2. - Mitral valve: Mildly calcified annulus. Mildly thickened leaflets . There was mild  regurgitation. - Left atrium: The atrium was severely dilated. - Pulmonary veins: There is systolic blunting of pulmonary vein flow suggesting elevated LA pressures. - Right ventricle: The interventricular septum is flattened in diastole suggesting RV volume overload. The cavity size was moderately to severely dilated. Systolic function was normal. RV TAPSE is 2.0 cm. - Right atrium: The atrium was severely dilated. - Tricuspid valve: There was moderate-severe regurgitation. TR VC is 0.7 cm. - Pulmonary arteries: Systolic pressure was moderately increased. PA peak pressure: 49 mm Hg (S). - Inferior vena cava: The vessel was dilated. The respirophasic diameter changes were blunted (< 50%), consistent with elevated central venous pressure. - Technically adequate study.    Radiology/Studies  Dg Chest 2 View  03/31/2014   CLINICAL DATA:  Pacemaker.  EXAM: CHEST  2 VIEW  COMPARISON:  03/26/2014 .  FINDINGS: Mediastinum hilar structures are normal. Cardiac pacer with lead tips projected over the right atrium and right ventricle. Cardiomegaly with mild diffuse pulmonary interstitial prominence suggesting mild congestive heart failure. Tiny right pleural effusion cannot be excluded. No pneumothorax. No acute osseous abnormality.  IMPRESSION: 1. Cardiac pacer with lead tips in right atrium right ventricle. 2. Cardiomegaly with mild follow pulmonary interstitial prominence suggesting mild congestive heart failure. Tiny right pleural effusion cannot be excluded.   Electronically Signed   By: Marcello Moores  Register   On: 03/31/2014 08:06   Dg Chest 2 View  03/26/2014   CLINICAL DATA:  Chest pain. Congestive heart failure. Edema and orthopnea.  EXAM: CHEST - 2 VIEW  COMPARISON:  Two-view chest 07/06/2013.  FINDINGS: The cardiopericardial silhouette has enlarged since prior exam. Atherosclerotic calcifications are again noted at the aorta. Mild chronic interstitial coarsening is present. A new  right pleural effusion is present. Right basilar airspace disease likely reflects atelectasis. The left lung base is clear.  IMPRESSION: 1. Interval enlargement of the cardiopericardial shadow. A pericardial effusion the knee is considered. Echocardiography may be useful further evaluation. 2. Atherosclerosis. 3. New right pleural effusion. Associated basilar airspace disease likely reflects atelectasis. This could be related to congestive heart failure although there is no significant edema. Infection is also considered.  These results will be called to the ordering clinician or representative by the Radiologist Assistant, and communication documented in the PACS or zVision Dashboard.   Electronically Signed   By: San Morelle M.D.   On: 03/26/2014 16:34    ASSESSMENT AND PLAN Keith Ellis is an 34M with CAD, HTN, atrial fibrillation/flutter, bradycardia, HL and mild AS here with acute on chronic diastolic heart failure. EP saw the patient and placed dual chamber medtronic PPM. Post procedure, pt had dyspnea, CXR showed mild CHF, he was continued on IV lasix.   1. Acute on chronic diastolic HF  - Net I/O -4L, weight decreased from 216 to 194. Appear to be near euvolemic on exam, likely can transition to PO demadex. Discharge later today vs tomorrow  - likely related diet noncompliance  2. Sick sinus/ second degree AV block  - s/p dual chamber Medtronic Adapta L model ADDRL 1 (serial number [id number]) pacemaker  3. afib with slow ventricular conduction  - CHADS2VASC score of at least 4  - continue eliquis  4. Symptomatic bradycarida/syncope  5. NSVT  6. Elevated TSH with normal free T4 and borderline low T3: followup with PCP  Signed, Almyra Deforest PA-C Pager: [phone]  I have seen and examined the patient along with Almyra Deforest PA-C.  I have reviewed the chart, notes and new data.  I agree with PA's note.  Key new complaints: occasional dizziness with position changes Key examination  changes: minimal left ankle edema, no rales; healthy pacemaker site Key new findings / data: CXR reviewed  PLAN: DC home. Careful sodium restriction and weight monitoring. Early follow up.  Sanda Klein, MD, [place] [phone] 04/01/2014, 10:49 AM

## 2014-04-01 NOTE — Discharge Instructions (Signed)
Heart Failure °Heart failure means your heart has trouble pumping blood. This makes it hard for your body to work well. Heart failure is usually a long-term (chronic) condition. You must take good care of yourself and follow your doctor's treatment plan. °HOME CARE °· Take your heart medicine as told by your doctor. °¨ Do not stop taking medicine unless your doctor tells you to. °¨ Do not skip any dose of medicine. °¨ Refill your medicines before they run out. °¨ Take other medicines only as told by your doctor or pharmacist. °· Stay active if told by your doctor. The elderly and people with severe heart failure should talk with a doctor about physical activity. °· Eat heart-healthy foods. Choose foods that are without trans fat and are low in saturated fat, cholesterol, and salt (sodium). This includes fresh or frozen fruits and vegetables, fish, lean meats, fat-free or low-fat dairy foods, whole grains, and high-fiber foods. Lentils and dried peas and beans (legumes) are also good choices. °· Limit salt if told by your doctor. °· Cook in a healthy way. Roast, grill, broil, bake, poach, steam, or stir-fry foods. °· Limit fluids as told by your doctor. °· Weigh yourself every morning. Do this after you pee (urinate) and before you eat breakfast. Write down your weight to give to your doctor. °· Take your blood pressure and write it down if your doctor tells you to. °· Ask your doctor how to check your pulse. Check your pulse as told. °· Lose weight if told by your doctor. °· Stop smoking or chewing tobacco. Do not use gum or patches that help you quit without your doctor's approval. °· Schedule and go to doctor visits as told. °· Nonpregnant women should have no more than 1 drink a day. Men should have no more than 2 drinks a day. Talk to your doctor about drinking alcohol. °· Stop illegal drug use. °· Stay current with shots (immunizations). °· Manage your health conditions as told by your doctor. °· Learn to  manage your stress. °· Rest when you are tired. °· If it is really hot outside: °¨ Avoid intense activities. °¨ Use air conditioning or fans, or get in a cooler place. °¨ Avoid caffeine and alcohol. °¨ Wear loose-fitting, lightweight, and light-colored clothing. °· If it is really cold outside: °¨ Avoid intense activities. °¨ Layer your clothing. °¨ Wear mittens or gloves, a hat, and a scarf when going outside. °¨ Avoid alcohol. °· Learn about heart failure and get support as needed. °· Get help to maintain or improve your quality of life and your ability to care for yourself as needed. °GET HELP IF:  °· You gain 03 lb/1.4 kg or more in 1 day or 05 lb/2.3 kg in a week. °· You are more short of breath than usual. °· You cannot do your normal activities. °· You tire easily. °· You cough more than normal, especially with activity. °· You have any or more puffiness (swelling) in areas such as your hands, feet, ankles, or belly (abdomen). °· You cannot sleep because it is hard to breathe. °· You feel like your heart is beating fast (palpitations). °· You get dizzy or light-headed when you stand up. °GET HELP RIGHT AWAY IF:  °· You have trouble breathing. °· There is a change in mental status, such as becoming less alert or not being able to focus. °· You have chest pain or discomfort. °· You faint. °MAKE SURE YOU:  °· Understand these instructions. °·   Will watch your condition.  Will get help right away if you are not doing well or get worse. Document Released: 10/04/2007 Document Revised: 05/11/2013 Document Reviewed: 02/11/2012 [facility] Patient Information 2015 [place], [place]. This information is not intended to replace advice given to you by your health care provider. Make sure you discuss any questions you have with your health care provider.         Supplemental Discharge Instructions for  Pacemaker/Defibrillator Patients  Activity No heavy lifting or vigorous activity with your left/right arm for  6 to 8 weeks.  Do not raise your left/right arm above your head for one week.  Gradually raise your affected arm as drawn below.           __3/26/2016               3/27                          3/28                         3/29  NO DRIVING given recent syncope  WOUND CARE - Keep the wound area clean and dry.  Do not get this area wet for one week. No showers for one week; you may shower on   04/08/2014  . - The tape/steri-strips on your wound will fall off; do not pull them off.  No bandage is needed on the site.  DO  NOT apply any creams, oils, or ointments to the wound area. - If you notice any drainage or discharge from the wound, any swelling or bruising at the site, or you develop a fever > 101? F after you are discharged home, call the office at once.  Special Instructions - You are still able to use cellular telephones; use the ear opposite the side where you have your pacemaker/defibrillator.  Avoid carrying your cellular phone near your device. - When traveling through airports, show security personnel your identification card to avoid being screened in the metal detectors.  Ask the security personnel to use the hand wand. - Avoid arc welding equipment, MRI testing (magnetic resonance imaging), TENS units (transcutaneous nerve stimulators).  Call the office for questions about other devices. - Avoid electrical appliances that are in poor condition or are not properly grounded. - Microwave ovens are safe to be near or to operate.  Additional information for defibrillator patients should your device go off: - If your device goes off ONCE and you feel fine afterward, notify the device clinic nurses. - If your device goes off ONCE and you do not feel well afterward, call 911. - If your device goes off TWICE, call 911. - If your device goes off THREE times in one day, call 911.  DO NOT DRIVE YOURSELF OR A FAMILY MEMBER WITH A DEFIBRILLATOR TO THE HOSPITAL--CALL 911.    Please limit  Salt intake and fluid intake to <2 L per day. Weigh yourself every day, call cardiology if weight increase by more than 3 lbs overnight or 5 lbs in a single week.

## 2014-04-02 NOTE — Telephone Encounter (Signed)
lmom for pt to cb

## 2014-04-06 NOTE — Telephone Encounter (Signed)
Do you still want these done? He had it done multiple times in the hospital.

## 2014-04-06 NOTE — Telephone Encounter (Signed)
No.  We'll check these on next appt

## 2014-04-08 ENCOUNTER — Ambulatory Visit (INDEPENDENT_AMBULATORY_CARE_PROVIDER_SITE_OTHER): Payer: Medicare HMO | Admitting: Family Medicine

## 2014-04-08 ENCOUNTER — Encounter: Payer: Self-pay | Admitting: Family Medicine

## 2014-04-08 VITALS — BP 130/60 | HR 64 | Temp 98.0°F | Resp 16 | Ht 67.0 in | Wt 202.0 lb

## 2014-04-08 DIAGNOSIS — I482 Chronic atrial fibrillation, unspecified: Secondary | ICD-10-CM

## 2014-04-08 DIAGNOSIS — R600 Localized edema: Secondary | ICD-10-CM

## 2014-04-08 DIAGNOSIS — G4700 Insomnia, unspecified: Secondary | ICD-10-CM | POA: Diagnosis not present

## 2014-04-08 MED ORDER — ZOLPIDEM TARTRATE 5 MG PO TABS: 5.0000 mg | ORAL_TABLET | Freq: Every evening | ORAL | Status: DC | PRN

## 2014-04-08 NOTE — Patient Instructions (Signed)
Insomnia Insomnia is frequent trouble falling and/or staying asleep. Insomnia can be a long term problem or a short term problem. Both are common. Insomnia can be a short term problem when the wakefulness is related to a certain stress or worry. Long term insomnia is often related to ongoing stress during waking hours and/or poor sleeping habits. Overtime, sleep deprivation itself can make the problem worse. Every little thing feels more severe because you are overtired and your ability to cope is decreased. CAUSES   Stress, anxiety, and depression.  Poor sleeping habits.  Distractions such as TV in the bedroom.  Naps close to bedtime.  Engaging in emotionally charged conversations before bed.  Technical reading before sleep.  Alcohol and other sedatives. They may make the problem worse. They can hurt normal sleep patterns and normal dream activity.  Stimulants such as caffeine for several hours prior to bedtime.  Pain syndromes and shortness of breath can cause insomnia.  Exercise late at night.  Changing time zones may cause sleeping problems (jet lag). It is sometimes helpful to have someone observe your sleeping patterns. They should look for periods of not breathing during the night (sleep apnea). They should also look to see how long those periods last. If you live alone or observers are uncertain, you can also be observed at a sleep clinic where your sleep patterns will be professionally monitored. Sleep apnea requires a checkup and treatment. Give your caregivers your medical history. Give your caregivers observations your family has made about your sleep.  SYMPTOMS   Not feeling rested in the morning.  Anxiety and restlessness at bedtime.  Difficulty falling and staying asleep. TREATMENT   Your caregiver may prescribe treatment for an underlying medical disorders. Your caregiver can give advice or help if you are using alcohol or other drugs for self-medication. Treatment  of underlying problems will usually eliminate insomnia problems.  Medications can be prescribed for short time use. They are generally not recommended for lengthy use.  Over-the-counter sleep medicines are not recommended for lengthy use. They can be habit forming.  You can promote easier sleeping by making lifestyle changes such as:  Using relaxation techniques that help with breathing and reduce muscle tension.  Exercising earlier in the day.  Changing your diet and the time of your last meal. No night time snacks.  Establish a regular time to go to bed.  Counseling can help with stressful problems and worry.  Soothing music and white noise may be helpful if there are background noises you cannot remove.  Stop tedious detailed work at least one hour before bedtime. HOME CARE INSTRUCTIONS   Keep a diary. Inform your caregiver about your progress. This includes any medication side effects. See your caregiver regularly. Take note of:  Times when you are asleep.  Times when you are awake during the night.  The quality of your sleep.  How you feel the next day. This information will help your caregiver care for you.  Get out of bed if you are still awake after 15 minutes. Read or do some quiet activity. Keep the lights down. Wait until you feel sleepy and go back to bed.  Keep regular sleeping and waking hours. Avoid naps.  Exercise regularly.  Avoid distractions at bedtime. Distractions include watching television or engaging in any intense or detailed activity like attempting to balance the household checkbook.  Develop a bedtime ritual. Keep a familiar routine of bathing, brushing your teeth, climbing into bed at the same   time each night, listening to soothing music. Routines increase the success of falling to sleep faster.  Use relaxation techniques. This can be using breathing and muscle tension release routines. It can also include visualizing peaceful scenes. You can  also help control troubling or intruding thoughts by keeping your mind occupied with boring or repetitive thoughts like the old concept of counting sheep. You can make it more creative like imagining planting one beautiful flower after another in your backyard garden.  During your day, work to eliminate stress. When this is not possible use some of the previous suggestions to help reduce the anxiety that accompanies stressful situations. MAKE SURE YOU:   Understand these instructions.  Will watch your condition.  Will get help right away if you are not doing well or get worse. Document Released: 12/23/1999 Document Revised: 03/19/2011 Document Reviewed: 01/22/2007 [employer] Patient Information 2015 [employer]. This information is not intended to replace advice given to you by your health care provider. Make sure you discuss any questions you have with your health care provider.  

## 2014-04-08 NOTE — Progress Notes (Signed)
Patient ID: Keith Ellis, male   DOB: September 07, 1929, 81 y.o.   MRN: [id number]  This chart was scribed for Keith Haber, MD by Keith Ellis, ED Scribe. The patient was seen in room 27. Patient's care was started at 12:35 PM.  Patient ID: Keith Ellis MRN: [id number], DOB: 07/01/1929, 81 y.o. Date of Encounter: 04/08/2014, 12:35 PM  Primary Physician: Keith Haber, MD  Chief Complaint  Patient presents with   Follow-up    chest pain - IP at [facility]    HPI: 81 y.o. year old male with history below presents for a follow-up regarding permanent pacemaker insertion on 03/30/14 by Dr. [name]. Pt states that he has not noticed palpations since placement, however, he reports associated leg swelling. He reports that he has taken his fluid pill as prescribed. Pt has an upcoming appointment with his cardiologist in June and an appointment with a would specialist on 04/15/14.  Mood Pt states that he is feeling well overall. His son reports decreased anxiety since placement.   Insomnia Pt reports difficulty sleeping for the past few nights. He states that he lies in bed but just does not feel sleepy. He further reports that he wakes up feeling fatigue in the mornings.   Past Medical History  Diagnosis Date   GERD (gastroesophageal reflux disease)    Hypertension    Blind     Right enucleation   Hearing loss    Coronary artery disease     Minimal LAD stenosis 2007   BPH (benign prostatic hypertrophy)    Hyperlipidemia    Varicose vein    Peripheral neuropathy    Tremor, essential    Aortic sclerosis     2008   Aortic stenosis     Mild, echo, January, 2014   Atrial flutter     December, 2013   Chronic anticoagulation     Apixaban started December 27, 2011   Edema     December, 2013   CHF (congestive heart failure)    Hearing difficulty of both ears     hearing aids   Urine retention 01/08/2013   Depression    Urinary catheter in place     Syncope 07/06/2013     Home Meds: Prior to Admission medications   Medication Sig Start Date End Date Taking? Authorizing Provider  acetaminophen-codeine (TYLENOL #4) 300-60 MG per tablet Take 1 tablet by mouth every 4 (four) hours as needed for moderate pain. 1/2 to 1 every 6 hours as needed for pain 12/24/13   Keith Haber, MD  clonazePAM (KLONOPIN) 0.5 MG tablet Take 1 tablet (0.5 mg total) by mouth 2 (two) times daily as needed for anxiety. Patient taking differently: Take 0.5 mg by mouth daily.  02/04/14   Keith Haber, MD  Coconut Oil 1000 MG CAPS Take 1,000 mg by mouth 2 (two) times daily.    Historical Provider, MD  Keith Ellis Liver Oil CAPS Take 1 capsule by mouth daily.    Historical Provider, MD  Cyanocobalamin (B-12 PO) Take 1 capsule by mouth daily.    Historical Provider, MD  ELDERBERRY PO Take 1 capsule by mouth daily.    Historical Provider, MD  ELIQUIS 5 MG TABS tablet TAKE ONE TABLET BY MOUTH TWICE DAILY    Minus Breeding, MD  escitalopram (LEXAPRO) 20 MG tablet Take 1 tablet (20 mg total) by mouth daily. 02/04/14   Keith Haber, MD  hydrALAZINE (APRESOLINE) 10 MG tablet Take 1 tablet (10 mg total) by mouth every  8 (eight) hours. 04/01/14   Almyra Deforest, PA  isosorbide mononitrate (IMDUR) 30 MG 24 hr tablet Take 0.5 tablets (15 mg total) by mouth daily. 04/01/14   Almyra Deforest, PA  Multiple Vitamin (MULTIVITAMIN WITH MINERALS) TABS tablet Take 1 tablet by mouth daily.    Historical Provider, MD  omeprazole (PRILOSEC) 40 MG capsule Take 1 capsule (40 mg total) by mouth daily. 07/31/13   Shawnee Knapp, MD  potassium chloride SA (K-DUR,KLOR-CON) 20 MEQ tablet Take 1 tablet (20 mEq total) by mouth daily. Along with lasix 04/01/14   Almyra Deforest, PA  torsemide (DEMADEX) 20 MG tablet TAKE ONE TABLET BY MOUTH TWICE DAILY 04/01/14   Almyra Deforest, PA    Allergies:  Allergies  Allergen Reactions   Menthol Hives    History   Social History   Marital Status: Married    Spouse Name: Keith Ellis"    Number of Children: 3   Years of Education: 8th grade   Occupational History   [employer]     Retired   Social History Main Topics   Smoking status: Former Smoker    Types: Cigarettes    Quit date: 07/20/1975   Smokeless tobacco: Former Systems developer   Alcohol Use: No   Drug Use: No   Sexual Activity: Not Currently   Other Topics Concern   Not on file   Social History Narrative   Patient has hostile neighbor with multiple police reports.  Patient is happily married, but son is at home and is not happy which creates anxious and frustrating relationships.    Caffeine Use: 1 cup daily     Review of Systems: Constitutional: negative for chills, fever, night sweats, weight changes, or fatigue  HEENT: negative for vision changes, hearing loss, congestion, rhinorrhea, ST, epistaxis, or sinus pressure Cardiovascular: negative for palpitations or chest pain, + leg swelling Respiratory: negative for hemoptysis, wheezing, shortness of breath, or cough Abdominal: negative for abdominal pain, nausea, vomiting, diarrhea, or constipation Dermatological: negative for rash Neurologic: negative for headache, dizziness, or syncope, + sleep disturbances  All other systems reviewed and are otherwise negative with the exception to those above and in the HPI.  Physical Exam: Blood pressure 130/60, pulse 64, temperature 98 F (36.7 C), temperature source Oral, resp. rate 16, height 5\' 7"  (1.702 m), weight 202 lb (91.627 kg), SpO2 96 %., Body mass index is 31.63 kg/(m^2). General: Well developed, well nourished, in no acute distress. Head: Normocephalic, atraumatic, eyes without discharge, sclera non-icteric, nares are without discharge. Bilateral auditory canals clear, TM's are without perforation, pearly grey and translucent with reflective cone of light bilaterally. Oral cavity moist, posterior pharynx without exudate, erythema, peritonsillar abscess, or post nasal drip.  Neck: Supple. No  thyromegaly. Full ROM. No lymphadenopathy. Lungs: Clear bilaterally to auscultation without wheezes, rales, or rhonchi. Breathing is unlabored. Heart: RRR with S1 S2. No murmurs, rubs, or gallops appreciated.  Chest: Pt has bandage on L chest. Under bandage is a large area of ecchymosis Abdomen: Soft, non-tender, non-distended with normoactive bowel sounds. No hepatomegaly. No rebound/guarding. No obvious abdominal masses. Msk:  Strength and tone normal for age. Extremities/Skin: Warm and dry. No clubbing or cyanosis. 3+ pedal edema. No rashes or suspicious lesions. Neuro: Alert and oriented X 3. Moves all extremities spontaneously. Gait is normal. CNII-XII grossly in tact. Psych:  Responds to questions appropriately with a normal affect.    ASSESSMENT AND PLAN:  81 y.o. year old male with  1. Insomnia  2. Chronic atrial fibrillation   3. Pedal edema    This chart was scribed in my presence and reviewed by me personally.    ICD-9-CM ICD-10-CM   1. Insomnia 780.52 G47.00 zolpidem (AMBIEN) 5 MG tablet  2. Chronic atrial fibrillation 427.31 I48.2   3. Pedal edema 782.3 R60.0      Signed, Keith Haber, MD   Signed, Keith Haber, MD 04/08/2014 12:35 PM

## 2014-04-15 ENCOUNTER — Telehealth: Payer: Self-pay

## 2014-04-15 ENCOUNTER — Ambulatory Visit (INDEPENDENT_AMBULATORY_CARE_PROVIDER_SITE_OTHER): Payer: Medicare HMO | Admitting: *Deleted

## 2014-04-15 DIAGNOSIS — I4892 Unspecified atrial flutter: Secondary | ICD-10-CM | POA: Diagnosis not present

## 2014-04-15 LAB — MDC_IDC_ENUM_SESS_TYPE_INCLINIC
Battery Impedance: 100 Ohm
Brady Statistic AP VP Percent: 93 %
Brady Statistic AS VP Percent: 7 %
Lead Channel Impedance Value: 596 Ohm
Lead Channel Impedance Value: 669 Ohm
Lead Channel Pacing Threshold Amplitude: 0.5 V
Lead Channel Pacing Threshold Pulse Width: 0.4 ms
Lead Channel Sensing Intrinsic Amplitude: 1.4 mV
Lead Channel Sensing Intrinsic Amplitude: 8 mV
Lead Channel Setting Pacing Amplitude: 3.5 V
Lead Channel Setting Pacing Pulse Width: 0.4 ms
MDC IDC MSMT BATTERY REMAINING LONGEVITY: 109 mo
MDC IDC MSMT BATTERY VOLTAGE: 2.79 V
MDC IDC MSMT LEADCHNL RV PACING THRESHOLD AMPLITUDE: 0.5 V
MDC IDC MSMT LEADCHNL RV PACING THRESHOLD PULSEWIDTH: 0.4 ms
MDC IDC SESS DTM: 20160407122152
MDC IDC SET LEADCHNL RV PACING AMPLITUDE: 3.5 V
MDC IDC SET LEADCHNL RV SENSING SENSITIVITY: 2 mV
MDC IDC STAT BRADY AP VS PERCENT: 0 %
MDC IDC STAT BRADY AS VS PERCENT: 0 %

## 2014-04-15 NOTE — Telephone Encounter (Signed)
Pt's daughter in law Yisrael Obryan dropped off a form for Dr Joseph Art to fill out. The form was placed in box @ clinical TL nurse station. Please call [phone] when completed for pick. Thank you

## 2014-04-15 NOTE — Progress Notes (Signed)
Wound check appointment. Steri-strips removed. Wound without redness or edema. Incision edges approximated, wound well healed. Normal device function. Thresholds, sensing, and impedances consistent with implant measurements. Device programmed at 3.5V for extra safety margin until 3 month visit. Histogram distribution appropriate for patient and level of activity. (2) mode switches(<0.1%)---1 AHR episode x 47 sec @150 /82---AT. No high ventricular rates noted. Patient educated about wound care, arm mobility, lifting restrictions. ROV with JA on 6/20 @ 1100.

## 2014-04-28 ENCOUNTER — Encounter: Payer: Self-pay | Admitting: Internal Medicine

## 2014-05-06 ENCOUNTER — Encounter: Payer: Self-pay | Admitting: Cardiology

## 2014-05-06 ENCOUNTER — Ambulatory Visit (INDEPENDENT_AMBULATORY_CARE_PROVIDER_SITE_OTHER): Payer: Medicare HMO | Admitting: Cardiology

## 2014-05-06 VITALS — BP 138/50 | HR 80 | Ht 67.0 in | Wt 188.2 lb

## 2014-05-06 DIAGNOSIS — R0989 Other specified symptoms and signs involving the circulatory and respiratory systems: Secondary | ICD-10-CM | POA: Diagnosis not present

## 2014-05-06 DIAGNOSIS — Z79899 Other long term (current) drug therapy: Secondary | ICD-10-CM | POA: Diagnosis not present

## 2014-05-06 NOTE — Progress Notes (Signed)
HPI The patient presents for followup of diastolic HF, atrial flutter, syncope,  pulmonary HTN and HTN    Since I last saw him he was hospitalized for volume overload with diastolic dysfunction. I reviewed these records. He also had tachybradycardia with his history of previous syncope he underwent permanent pacemaker placement.  Since he was last seen he has had no new complaints. . He's had some rare palpitations. However, he's not having any PND or orthopnea. He's not having any presyncope or syncope. His weight has been very stable. However, his edema is less than previous. The patient denies any new symptoms such as chest discomfort, neck or arm discomfort.  He thinks his breathing is better than it was previously. He's had no dizziness, presyncope or syncope.   Allergies  Allergen Reactions  . Menthol Hives    Current Outpatient Prescriptions  Medication Sig Dispense Refill  . acetaminophen-codeine (TYLENOL #4) 300-60 MG per tablet Take 1 tablet by mouth every 4 (four) hours as needed for moderate pain. 1/2 to 1 every 6 hours as needed for pain 30 tablet 0  . clonazePAM (KLONOPIN) 0.5 MG tablet Take 1 tablet (0.5 mg total) by mouth 2 (two) times daily as needed for anxiety. (Patient taking differently: Take 0.5 mg by mouth 2 (two) times daily as needed. ) 60 tablet 1  . Coconut Oil 1000 MG CAPS Take 1,000 mg by mouth 2 (two) times daily.    Marland Kitchen Cod Liver Oil CAPS Take 1 capsule by mouth daily.    . Cyanocobalamin (B-12 PO) Take 1 capsule by mouth daily.    Marland Kitchen ELDERBERRY PO Take 1 capsule by mouth daily.    Marland Kitchen ELIQUIS 5 MG TABS tablet TAKE ONE TABLET BY MOUTH TWICE DAILY 60 tablet 1  . escitalopram (LEXAPRO) 20 MG tablet Take 1 tablet (20 mg total) by mouth daily. 30 tablet 5  . hydrALAZINE (APRESOLINE) 10 MG tablet Take 1 tablet (10 mg total) by mouth every 8 (eight) hours. 90 tablet 5  . isosorbide mononitrate (IMDUR) 30 MG 24 hr tablet Take 0.5 tablets (15 mg total) by mouth daily. 30  tablet 5  . Multiple Vitamin (MULTIVITAMIN WITH MINERALS) TABS tablet Take 1 tablet by mouth daily.    Marland Kitchen omeprazole (PRILOSEC) 40 MG capsule Take 1 capsule (40 mg total) by mouth daily. 30 capsule 5  . potassium chloride SA (K-DUR,KLOR-CON) 20 MEQ tablet Take 1 tablet (20 mEq total) by mouth daily. Along with lasix 30 tablet 3  . torsemide (DEMADEX) 20 MG tablet TAKE ONE TABLET BY MOUTH TWICE DAILY 60 tablet 3  . zolpidem (AMBIEN) 5 MG tablet Take 1 tablet (5 mg total) by mouth at bedtime as needed for sleep. 90 tablet 1   No current facility-administered medications for this visit.    Past Medical History  Diagnosis Date  . GERD (gastroesophageal reflux disease)   . Hypertension   . Blind     Right enucleation  . Hearing loss   . Coronary artery disease     Minimal LAD stenosis 2007  . BPH (benign prostatic hypertrophy)   . Hyperlipidemia   . Varicose vein   . Peripheral neuropathy   . Tremor, essential   . Aortic sclerosis     2008  . Aortic stenosis     Mild, echo, January, 2014  . Atrial flutter     December, 2013  . Chronic anticoagulation     Apixaban started December 27, 2011  . Edema  December, 2013  . CHF (congestive heart failure)   . Hearing difficulty of both ears     hearing aids  . Urine retention 01/08/2013  . Depression   . Urinary catheter in place   . Syncope 07/06/2013    Past Surgical History  Procedure Laterality Date  . Lung surgery      Benign lump removed  . Tumor excision      behindleft eye,abpve left eye,left foot  . Esophagogastroduodenoscopy N/A 06/23/2012    Procedure: ESOPHAGOGASTRODUODENOSCOPY (EGD);  Surgeon: Ladene Artist, MD;  Location: [facility];  Service: Endoscopy;  Laterality: N/A;  . Upper gastrointestinal endoscopy    . Cataract extraction Right   . Eye surgery Left     removal eyeball  . Transurethral resection of prostate N/A 03/11/2013    Procedure: TRANSURETHRAL RESECTION OF THE PROSTATE WITH GYRUS INSTRUMENTS;   Surgeon: Alexis Frock, MD;  Location: [facility];  Service: Urology;  Laterality: N/A;  . Permanent pacemaker insertion N/A 03/30/2014    Procedure: PERMANENT PACEMAKER INSERTION;  Surgeon: [name], MD;  Location: [facility] CATH LAB;  Service: Cardiovascular;  Laterality: N/A;   ROS:  As stated in the HPI and negative for all other systems.  PHYSICAL EXAM BP 138/50 mmHg  Pulse 80  Ht 5\' 7"  (1.702 m)  Wt 188 lb 3.2 oz (85.367 kg)  BMI 29.47 kg/m2 GENERAL:  Frail appearing, no distress  NECK:  Jugular venous distention to the jaw at 45, waveform within normal limits, carotid upstroke brisk and symmetric, no bruits, no thyromegaly LYMPHATICS:  No cervical, inguinal adenopathy LUNGS:  Clear to auscultation bilaterally BACK:  No CVA tenderness CHEST:  Pacer pocket OK.  HEART:  PMI not displaced or sustained,S1 and S2 within normal limits, no S3, no S4,  no clicks, no rubs, harsh systolic murmur radiating out the outflow tract and mid to late peaking, no diastolic murmurs ABD:  Flat, positive bowel sounds normal in frequency in pitch, positive abdominal bruits, no rebound, no guarding, positive midline pulsatile mass, no hepatomegaly, no splenomegaly, obese EXT:  2 plus pulses throughout,  Moderate bilateral edema, no cyanosis no clubbing, venous stasis changes SKIN:  No rashes no nodules.    ASSESSMENT AND PLAN  CKD - His creatinine was stable in the hospital.  I will follow up with a BMET.  Tachybrady- He will follow up in the pacer clinic.  Atrial flutter -  He's tolerating anticoagulation. He will remain on the meds as listed.    Aortic sclerosis -  He has very mild aortic stenosis on the recent echo. No change in therapy is indicated.  Hypertension -  The blood pressure is at target. No change in medications is indicated. We will continue with therapeutic lifestyle changes (TLC).  Coronary artery disease -  He has no active symptoms. No change in therapy is indicated.  Pulmonary  hypertension - The pulmonary hypertension is moderate on the recent echo.  He has dyspnea that is unchanged and his exam is unchanged.  No change in therapy is indicated.    Bruit - I will order an abdominal ultrasound.

## 2014-05-06 NOTE — Patient Instructions (Addendum)
Your physician wants you to follow-up in: 6 months. You will receive a reminder letter in the mail two months in advance. If you don't receive a letter, please call our office to schedule the follow-up appointment.   Your physician has requested that you have an abdominal aorta duplex. During this test, an ultrasound is used to evaluate the aorta. Allow 30 minutes for this exam. Do not eat after midnight the day before and avoid carbonated beverages  Your physician recommends that you have lab work drawn before you leave today

## 2014-05-07 LAB — BASIC METABOLIC PANEL
BUN: 19 mg/dL (ref 6–23)
CHLORIDE: 104 meq/L (ref 96–112)
CO2: 27 mEq/L (ref 19–32)
CREATININE: 1.11 mg/dL (ref 0.50–1.35)
Calcium: 8.9 mg/dL (ref 8.4–10.5)
GLUCOSE: 92 mg/dL (ref 70–99)
Potassium: 4.4 mEq/L (ref 3.5–5.3)
SODIUM: 141 meq/L (ref 135–145)

## 2014-05-19 ENCOUNTER — Other Ambulatory Visit: Payer: Self-pay | Admitting: Family Medicine

## 2014-05-19 NOTE — Telephone Encounter (Signed)
Faxed

## 2014-05-20 ENCOUNTER — Encounter: Payer: Self-pay | Admitting: Family Medicine

## 2014-05-20 ENCOUNTER — Ambulatory Visit (INDEPENDENT_AMBULATORY_CARE_PROVIDER_SITE_OTHER): Payer: Medicare HMO | Admitting: Family Medicine

## 2014-05-20 VITALS — BP 137/66 | HR 86 | Temp 98.0°F | Resp 16 | Ht 67.0 in | Wt 191.2 lb

## 2014-05-20 DIAGNOSIS — R197 Diarrhea, unspecified: Secondary | ICD-10-CM | POA: Diagnosis not present

## 2014-05-20 DIAGNOSIS — F411 Generalized anxiety disorder: Secondary | ICD-10-CM | POA: Diagnosis not present

## 2014-05-20 DIAGNOSIS — I5022 Chronic systolic (congestive) heart failure: Secondary | ICD-10-CM

## 2014-05-20 DIAGNOSIS — G4700 Insomnia, unspecified: Secondary | ICD-10-CM

## 2014-05-20 MED ORDER — CLONAZEPAM 0.5 MG PO TABS
0.5000 mg | ORAL_TABLET | Freq: Two times a day (BID) | ORAL | Status: DC | PRN
Start: 2014-05-20 — End: 2014-10-07

## 2014-05-20 MED ORDER — METRONIDAZOLE 250 MG PO TABS
250.0000 mg | ORAL_TABLET | Freq: Two times a day (BID) | ORAL | Status: DC
Start: 2014-05-20 — End: 2014-06-28

## 2014-05-20 NOTE — Progress Notes (Signed)
° °  Subjective:    Patient ID: Keith Ellis, male    DOB: November 30, 1929, 81 y.o.   MRN: [id number] This chart was scribed for [name], MD by Leandra Kern, Medical Scribe. This patient was seen in Room 26 and the patient's care was started at 11:47 AM.  HPI HPI Comments: Keith Ellis is a 81 y.o. male who presents to [facility] for a follow-up.   Pt reports feeling pretty good. however, he states that he usually does not feel "good" after waking up. Pt complains having repeating symptoms of diarrhea for the past month, that worsen after eating in general. The symptoms usually resolves after three days, and the cycle repeats. Patient indicates that he was recently hospitalized, and was put on antibiotics. Patient also complains of having a lot of gas. He is still taking cod Liver Oil. He reports no change in his diet, however, he states that he does not eat a lot, and that he lost weight. Patient denies having bloody stool, fever, chest pain, or shortness of breath. He is still taking low dosage of Klonopin once at night, and would like to get a refill for that. Additionally, patient reports concern for enlarged aorta.  No family member is complaining of diarrhea.   Review of Systems  Constitutional: Negative for fever.  Respiratory: Negative for shortness of breath.   Cardiovascular: Negative for chest pain.  Gastrointestinal: Positive for diarrhea. Negative for blood in stool.       Objective:   Physical Exam  Constitutional: He is oriented to person, place, and time. He appears well-developed and well-nourished. No distress.  HENT:  Head: Normocephalic and atraumatic.  Mouth/Throat: Oropharynx is clear and moist. No oropharyngeal exudate.  Eyes: Conjunctivae are normal. Pupils are equal, round, and reactive to light.  Blind left eye with prosthesis  Neck: Normal range of motion. Neck supple.  Cardiovascular: Normal rate and regular rhythm.  Exam reveals no  gallop and no friction rub.   Murmur heard. Loud holosystolic murmur, best heard at the right sternal border   Pulmonary/Chest: Effort normal and breath sounds normal.  Abdominal: Soft. He exhibits no distension and no mass. There is no tenderness. There is no rebound and no guarding.  Bowel activity: hyperactive BS  Musculoskeletal: Normal range of motion. He exhibits edema.  In the calves 1/3 of way up.  Scaly violaceous post phlebitic type changes in the skin without breaks  Neurological: He is alert and oriented to person, place, and time. No cranial nerve deficit.  Skin: Skin is warm and dry. No rash noted.  Psychiatric: He has a normal mood and affect. His behavior is normal.  Calm Seen with son and wife  Nursing note and vitals reviewed.  Assessment & Plan:   This chart was scribed in my presence and reviewed by me personally.    ICD-9-CM ICD-10-CM   1. Diarrhea 787.91 R19.7 metroNIDAZOLE (FLAGYL) 250 MG tablet  2. Chronic systolic congestive heart failure 428.22 I50.22    428.0    3. Insomnia 780.52 G47.00   4. Anxiety state 300.00 F41.1    Weight is now down from a high of 207  Signed, [name], MD

## 2014-05-20 NOTE — Patient Instructions (Signed)
Please let me know if new symptoms arise.  Follow up with Dr. Percival Spanish. I think your ideal weight would be 185.  Let us know if weight increases to 200 or more.

## 2014-05-25 ENCOUNTER — Ambulatory Visit (HOSPITAL_COMMUNITY)
Admission: RE | Admit: 2014-05-25 | Discharge: 2014-05-25 | Disposition: A | Discharge status: Home / Self Care | Payer: Medicare HMO | Source: Ambulatory Visit | Attending: Cardiology | Admitting: Cardiology

## 2014-05-25 ENCOUNTER — Other Ambulatory Visit: Payer: Self-pay | Admitting: *Deleted

## 2014-05-25 DIAGNOSIS — R0989 Other specified symptoms and signs involving the circulatory and respiratory systems: Secondary | ICD-10-CM | POA: Insufficient documentation

## 2014-05-25 DIAGNOSIS — I868 Varicose veins of other specified sites: Secondary | ICD-10-CM

## 2014-05-25 DIAGNOSIS — Z950 Presence of cardiac pacemaker: Secondary | ICD-10-CM | POA: Insufficient documentation

## 2014-05-25 NOTE — Progress Notes (Signed)
Patient had lower extremity doppler done.  Technician reviewed results with DOD (Dr Gwenlyn Found).  Dr Gwenlyn Found referred patient to Dr Trula Slade. Order placed.

## 2014-05-25 NOTE — Progress Notes (Signed)
Abdominal aorta completed. Preliminary results by tech -- No evidence of AAA seen. 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 Dr. Gwenlyn Found evaluated pt and patient was referred to Dr. Trula Slade for consult.

## 2014-05-27 ENCOUNTER — Other Ambulatory Visit: Payer: Self-pay | Admitting: *Deleted

## 2014-05-27 DIAGNOSIS — I723 Aneurysm of iliac artery: Secondary | ICD-10-CM

## 2014-05-27 DIAGNOSIS — I739 Peripheral vascular disease, unspecified: Secondary | ICD-10-CM

## 2014-05-30 ENCOUNTER — Other Ambulatory Visit: Payer: Self-pay | Admitting: *Deleted

## 2014-05-30 DIAGNOSIS — I723 Aneurysm of iliac artery: Secondary | ICD-10-CM

## 2014-05-30 NOTE — Progress Notes (Signed)
Order placed for Ct to evaluated iliacs prior to office visit with Dr Trula Slade

## 2014-06-04 ENCOUNTER — Other Ambulatory Visit: Payer: Medicare HMO

## 2014-06-04 ENCOUNTER — Ambulatory Visit
Admission: RE | Admit: 2014-06-04 | Discharge: 2014-06-04 | Disposition: A | Discharge status: Home / Self Care | Payer: Medicare HMO | Source: Ambulatory Visit | Attending: Cardiovascular Disease | Admitting: Cardiovascular Disease

## 2014-06-04 DIAGNOSIS — I723 Aneurysm of iliac artery: Secondary | ICD-10-CM

## 2014-06-04 MED ORDER — IOPAMIDOL (ISOVUE-370) INJECTION 76%
75.0000 mL | Freq: Once | INTRAVENOUS | Status: AC | PRN
  Administered 2014-06-04: 75 mL via INTRAVENOUS

## 2014-06-10 ENCOUNTER — Telehealth: Payer: Self-pay | Admitting: Cardiovascular Disease

## 2014-06-11 ENCOUNTER — Encounter: Payer: Self-pay | Admitting: Surgery

## 2014-06-14 ENCOUNTER — Encounter: Payer: Self-pay | Admitting: Surgery

## 2014-06-14 ENCOUNTER — Ambulatory Visit (INDEPENDENT_AMBULATORY_CARE_PROVIDER_SITE_OTHER): Payer: Medicare HMO | Admitting: Surgery

## 2014-06-14 VITALS — BP 153/71 | HR 87 | Ht 67.0 in | Wt 193.2 lb

## 2014-06-14 DIAGNOSIS — I868 Varicose veins of other specified sites: Secondary | ICD-10-CM
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
## 2014-06-16 NOTE — Telephone Encounter (Signed)
Closed encounter °

## 2014-06-18 ENCOUNTER — Other Ambulatory Visit: Payer: Self-pay

## 2014-06-21 ENCOUNTER — Telehealth: Payer: Self-pay

## 2014-06-21 NOTE — Telephone Encounter (Signed)
-----   Message from Minus Breeding, MD sent at 06/19/2014  6:30 PM EDT ----- Regarding: RE: Eliquis Bridging is not indicated.  Thanks.  Marijo File  ----- Message -----    From: Denman George, RN    Sent: 06/18/2014   5:55 PM      To: Minus Breeding, MD Subject: Eliquis                                        This pt. is scheduled for an Abdominal/ Pelvic Angiogram 06/23/14, to evaluate a large Left Iliac Vein Aneurysm.  I have asked him to start holding the Eliquis on Sunday, 6/12, 3 days prior to procedure.  Will it be necessary to bridge him with Lovenox on Monday and Tuesday?

## 2014-06-23 ENCOUNTER — Encounter (HOSPITAL_COMMUNITY)
Admission: RE | Disposition: A | Discharge status: Home / Self Care | Payer: Medicare HMO | Source: Ambulatory Visit | Attending: Surgery

## 2014-06-23 ENCOUNTER — Encounter (HOSPITAL_COMMUNITY): Payer: Self-pay | Admitting: Surgery

## 2014-06-23 ENCOUNTER — Ambulatory Visit (HOSPITAL_COMMUNITY)
Admission: RE | Admit: 2014-06-23 | Discharge: 2014-06-23 | Disposition: A | Discharge status: Home / Self Care | Payer: Medicare HMO | Source: Ambulatory Visit | Attending: Surgery | Admitting: Surgery

## 2014-06-23 DIAGNOSIS — N401 Enlarged prostate with lower urinary tract symptoms: Secondary | ICD-10-CM | POA: Diagnosis not present

## 2014-06-23 DIAGNOSIS — G629 Polyneuropathy, unspecified: Secondary | ICD-10-CM | POA: Diagnosis not present

## 2014-06-23 DIAGNOSIS — Z950 Presence of cardiac pacemaker: Secondary | ICD-10-CM | POA: Insufficient documentation

## 2014-06-23 DIAGNOSIS — E785 Hyperlipidemia, unspecified: Secondary | ICD-10-CM | POA: Insufficient documentation

## 2014-06-23 DIAGNOSIS — G250 Essential tremor: Secondary | ICD-10-CM | POA: Diagnosis not present

## 2014-06-23 DIAGNOSIS — I5030 Unspecified diastolic (congestive) heart failure: Secondary | ICD-10-CM | POA: Diagnosis not present

## 2014-06-23 DIAGNOSIS — H540 Blindness, both eyes: Secondary | ICD-10-CM | POA: Diagnosis not present

## 2014-06-23 DIAGNOSIS — Z7901 Long term (current) use of anticoagulants: Secondary | ICD-10-CM | POA: Insufficient documentation

## 2014-06-23 DIAGNOSIS — Z87891 Personal history of nicotine dependence: Secondary | ICD-10-CM | POA: Insufficient documentation

## 2014-06-23 DIAGNOSIS — Z8546 Personal history of malignant neoplasm of prostate: Secondary | ICD-10-CM | POA: Diagnosis not present

## 2014-06-23 DIAGNOSIS — I770 Arteriovenous fistula, acquired: Secondary | ICD-10-CM | POA: Insufficient documentation

## 2014-06-23 DIAGNOSIS — F329 Major depressive disorder, single episode, unspecified: Secondary | ICD-10-CM | POA: Insufficient documentation

## 2014-06-23 DIAGNOSIS — K219 Gastro-esophageal reflux disease without esophagitis: Secondary | ICD-10-CM | POA: Diagnosis not present

## 2014-06-23 DIAGNOSIS — H9190 Unspecified hearing loss, unspecified ear: Secondary | ICD-10-CM | POA: Diagnosis not present

## 2014-06-23 DIAGNOSIS — I700 Atherosclerosis of aorta: Secondary | ICD-10-CM | POA: Insufficient documentation

## 2014-06-23 DIAGNOSIS — I10 Essential (primary) hypertension: Secondary | ICD-10-CM | POA: Insufficient documentation

## 2014-06-23 DIAGNOSIS — I868 Varicose veins of other specified sites: Secondary | ICD-10-CM | POA: Diagnosis not present

## 2014-06-23 DIAGNOSIS — I4892 Unspecified atrial flutter: Secondary | ICD-10-CM | POA: Insufficient documentation

## 2014-06-23 DIAGNOSIS — R338 Other retention of urine: Secondary | ICD-10-CM | POA: Insufficient documentation

## 2014-06-23 DIAGNOSIS — I350 Nonrheumatic aortic (valve) stenosis: Secondary | ICD-10-CM | POA: Diagnosis not present

## 2014-06-23 DIAGNOSIS — I517 Cardiomegaly: Secondary | ICD-10-CM | POA: Insufficient documentation

## 2014-06-23 DIAGNOSIS — I2510 Atherosclerotic heart disease of native coronary artery without angina pectoris: Secondary | ICD-10-CM | POA: Diagnosis not present

## 2014-06-23 HISTORY — PX: PERIPHERAL VASCULAR CATHETERIZATION: SHX172C

## 2014-06-23 LAB — POCT I-STAT, CHEM 8
BUN: 24 mg/dL — AB (ref 6–20)
CALCIUM ION: 1.13 mmol/L (ref 1.13–1.30)
Chloride: 104 mmol/L (ref 101–111)
Creatinine, Ser: 1.2 mg/dL (ref 0.61–1.24)
GLUCOSE: 101 mg/dL — AB (ref 65–99)
HCT: 44 % (ref 39.0–52.0)
Hemoglobin: 15 g/dL (ref 13.0–17.0)
POTASSIUM: 4 mmol/L (ref 3.5–5.1)
Sodium: 142 mmol/L (ref 135–145)
TCO2: 26 mmol/L (ref 0–100)

## 2014-06-23 LAB — PROTIME-INR
INR: 1.46 (ref 0.00–1.49)
Prothrombin Time: 17.8 seconds — ABNORMAL HIGH (ref 11.6–15.2)

## 2014-06-23 SURGERY — ABDOMINAL AORTOGRAM
Anesthesia: LOCAL

## 2014-06-23 MED ORDER — LIDOCAINE HCL (PF) 1 % IJ SOLN
INTRAMUSCULAR | Status: AC
  Filled 2014-06-23: qty 30

## 2014-06-23 MED ORDER — IODIXANOL 320 MG/ML IV SOLN
INTRAVENOUS | Status: DC | PRN
  Administered 2014-06-23: 110 mL via INTRAVENOUS

## 2014-06-23 MED ORDER — FENTANYL CITRATE (PF) 100 MCG/2ML IJ SOLN
INTRAMUSCULAR | Status: AC
  Filled 2014-06-23: qty 2

## 2014-06-23 MED ORDER — ACETAMINOPHEN 325 MG PO TABS: 325.0000 mg | ORAL_TABLET | ORAL | Status: DC | PRN

## 2014-06-23 MED ORDER — ONDANSETRON HCL 4 MG/2ML IJ SOLN: 4.0000 mg | Freq: Four times a day (QID) | INTRAMUSCULAR | Status: DC | PRN

## 2014-06-23 MED ORDER — SODIUM CHLORIDE 0.9 % IV SOLN: 1.0000 mL/kg/h | INTRAVENOUS | Status: DC

## 2014-06-23 MED ORDER — FENTANYL CITRATE (PF) 100 MCG/2ML IJ SOLN
INTRAMUSCULAR | Status: DC | PRN
  Administered 2014-06-23: 25 ug via INTRAVENOUS

## 2014-06-23 MED ORDER — ALUM & MAG HYDROXIDE-SIMETH 200-200-20 MG/5ML PO SUSP: 15.0000 mL | ORAL | Status: DC | PRN

## 2014-06-23 MED ORDER — METOPROLOL TARTRATE 1 MG/ML IV SOLN: 2.0000 mg | INTRAVENOUS | Status: DC | PRN

## 2014-06-23 MED ORDER — LABETALOL HCL 5 MG/ML IV SOLN: 10.0000 mg | INTRAVENOUS | Status: DC | PRN

## 2014-06-23 MED ORDER — DOCUSATE SODIUM 100 MG PO CAPS: 100.0000 mg | ORAL_CAPSULE | Freq: Every day | ORAL | Status: DC

## 2014-06-23 MED ORDER — MIDAZOLAM HCL 2 MG/2ML IJ SOLN
INTRAMUSCULAR | Status: AC
  Filled 2014-06-23: qty 2

## 2014-06-23 MED ORDER — LIDOCAINE HCL (PF) 1 % IJ SOLN
INTRAMUSCULAR | Status: DC | PRN
  Administered 2014-06-23: 30 mL

## 2014-06-23 MED ORDER — MIDAZOLAM HCL 2 MG/2ML IJ SOLN
INTRAMUSCULAR | Status: DC | PRN
  Administered 2014-06-23: 1 mg via INTRAVENOUS

## 2014-06-23 MED ORDER — OXYCODONE HCL 5 MG PO TABS: 5.0000 mg | ORAL_TABLET | ORAL | Status: DC | PRN

## 2014-06-23 MED ORDER — HYDRALAZINE HCL 20 MG/ML IJ SOLN: 5.0000 mg | INTRAMUSCULAR | Status: DC | PRN

## 2014-06-23 MED ORDER — HEPARIN (PORCINE) IN NACL 2-0.9 UNIT/ML-% IJ SOLN
INTRAMUSCULAR | Status: AC
  Filled 2014-06-23: qty 1000

## 2014-06-23 MED ORDER — SODIUM CHLORIDE 0.9 % IV SOLN
INTRAVENOUS | Status: DC
  Administered 2014-06-23: 08:00:00 via INTRAVENOUS

## 2014-06-23 MED ORDER — MORPHINE SULFATE 10 MG/ML IJ SOLN: 2.0000 mg | INTRAMUSCULAR | Status: DC | PRN

## 2014-06-23 MED ORDER — ACETAMINOPHEN 325 MG RE SUPP: 325.0000 mg | RECTAL | Status: DC | PRN

## 2014-06-23 SURGICAL SUPPLY — 19 items
CATH ANGIO 5F BER2 65CM (CATHETERS) ×2 IMPLANT
CATH OMNI FLUSH 5F 65CM (CATHETERS) ×2 IMPLANT
COVER PRB 48X5XTLSCP FOLD TPE (BAG) ×1 IMPLANT
COVER PROBE 5X48 (BAG) ×2
DEVICE TORQUE H2O (MISCELLANEOUS) ×2 IMPLANT
DRAPE ZERO GRAVITY STERILE (DRAPES) ×2 IMPLANT
GUIDEWIRE ANGLED .035X150CM (WIRE) ×2 IMPLANT
HAND CONTROLLER AVANTA (MISCELLANEOUS) IMPLANT
KIT MICROINTRODUCER STIFF 5F (SHEATH) ×2 IMPLANT
KIT PV (KITS) ×2 IMPLANT
SET AVANTA MULTI PATIENT (MISCELLANEOUS) IMPLANT
SET AVANTA SINGLE PATIENT (MISCELLANEOUS) IMPLANT
SHEATH AVANTA HAND CONTROLLER (MISCELLANEOUS) IMPLANT
SHEATH PINNACLE 5F 10CM (SHEATH) ×2 IMPLANT
SYR MEDRAD MARK V 150ML (SYRINGE) ×2 IMPLANT
TRANSDUCER W/STOPCOCK (MISCELLANEOUS) ×2 IMPLANT
TRAY PV CATH (CUSTOM PROCEDURE TRAY) ×2 IMPLANT
TUBING HIGH PRESSURE 120CM (CONNECTOR) ×2 IMPLANT
WIRE BENTSON .035X145CM (WIRE) ×2 IMPLANT
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
## 2014-06-23 NOTE — Op Note (Addendum)
    Patient name: Keith Ellis MRN: [id number] DOB: [birth year] Sex: male  06/23/2014 Pre-operative Diagnosis: Arterial venous fistula Post-operative diagnosis:  Same Surgeon:  [name] Procedure Performed:  1.  Ultrasound access, right femoral artery  2.  Pelvic angiogram  3.  Third order catheterization (anterior and posterior divisions of the left hypogastric artery    Indications:  The patient was incidentally found to have a large left iliac vein aneurysm.  CT scan imaging revealed an arteriovenous fistula.  He is here for further evaluation.  Procedure:  The patient was identified in the holding area and taken to room 8.  The patient was then placed supine on the table and prepped and draped in the usual sterile fashion.  A time out was called.  Ultrasound was used to evaluate the right common femoral artery.  It was patent .  A digital ultrasound image was acquired.  A micropuncture needle was used to access the right common femoral artery under ultrasound guidance.  An 018 wire was advanced without resistance and a micropuncture sheath was placed.  The 018 wire was removed and a benson wire was placed.  The micropuncture sheath was exchanged for a 5 french sheath.  An omniflush catheter was Use to cross the aortic bifurcation.  I then used a Berenstein 2 catheter to select the left hypogastric artery and take images within the main trunk as well as within the anterior and posterior branches.    Findings:   pelvic angiogram   The main trunk of the left hypogastric artery is widely patent as are the anterior and posterior branches.  There is a fistulous communication between the posterior branch and the iliac vein with aneurysmal changes within the iliac vein no definitive evidence of a single versus multiple fistula could be acquired.  There was a slight perforation and tried to manipulate the catheter within the posterior division of the hypogastric artery.  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 [name], M.D. [facility] [place] Office: [phone] Pager:  [phone]

## 2014-06-23 NOTE — Interval H&P Note (Signed)
History and Physical Interval Note:  06/23/2014 12:03 PM  Keith Ellis  has presented today for surgery, with the diagnosis of large iliac vein aneurysm  The various methods of treatment have been discussed with the patient and family. After consideration of risks, benefits and other options for treatment, the patient has consented to  Procedure(s): Abdominal Aortogram (N/A) Pelvic Angiography (N/A) as a surgical intervention .  The patient's history has been reviewed, patient examined, no change in status, stable for surgery.  I have reviewed the patient's chart and labs.  Questions were answered to the patient's satisfaction.     [name]
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
## 2014-06-23 NOTE — Progress Notes (Signed)
Site area: rt groin Site Prior to Removal:  Level 0 Pressure Applied For: 20 minutesManual:   yes Patient Status During Pull:  stable Post Pull Site:  Level 0 Post Pull Instructions Given:  yes Post Pull Pulses Present:  yes Dressing Applied:  tegaderm Bedrest begins @ 2411 Comments:

## 2014-06-23 NOTE — Discharge Instructions (Signed)

## 2014-06-24 ENCOUNTER — Encounter: Payer: Self-pay | Admitting: Family Medicine

## 2014-06-24 MED FILL — Heparin Sodium (Porcine) 2 Unit/ML in Sodium Chloride 0.9%: INTRAMUSCULAR | Qty: 1000 | Status: AC

## 2014-06-28 ENCOUNTER — Ambulatory Visit (INDEPENDENT_AMBULATORY_CARE_PROVIDER_SITE_OTHER): Payer: Medicare HMO | Admitting: Internal Medicine

## 2014-06-28 ENCOUNTER — Encounter: Payer: Self-pay | Admitting: Internal Medicine

## 2014-06-28 VITALS — BP 122/48 | HR 82 | Ht 67.0 in | Wt 190.4 lb

## 2014-06-28 DIAGNOSIS — R001 Bradycardia, unspecified: Secondary | ICD-10-CM

## 2014-06-28 DIAGNOSIS — I10 Essential (primary) hypertension: Secondary | ICD-10-CM | POA: Diagnosis not present

## 2014-06-28 DIAGNOSIS — I441 Atrioventricular block, second degree: Secondary | ICD-10-CM | POA: Diagnosis not present

## 2014-06-28 LAB — CUP PACEART INCLINIC DEVICE CHECK
Battery Remaining Longevity: 115 mo
Battery Voltage: 2.78 V
Brady Statistic AP VP Percent: 99 %
Brady Statistic AS VP Percent: 1 %
Lead Channel Impedance Value: 518 Ohm
Lead Channel Pacing Threshold Amplitude: 0.5 V
Lead Channel Pacing Threshold Amplitude: 0.5 V
Lead Channel Sensing Intrinsic Amplitude: 0.7 mV
Lead Channel Setting Pacing Amplitude: 2 V
Lead Channel Setting Pacing Amplitude: 2 V
Lead Channel Setting Pacing Pulse Width: 0.4 ms
Lead Channel Setting Sensing Sensitivity: 2 mV
MDC IDC MSMT BATTERY IMPEDANCE: 100 Ohm
MDC IDC MSMT LEADCHNL RA PACING THRESHOLD PULSEWIDTH: 0.4 ms
MDC IDC MSMT LEADCHNL RV IMPEDANCE VALUE: 551 Ohm
MDC IDC MSMT LEADCHNL RV PACING THRESHOLD PULSEWIDTH: 0.4 ms
MDC IDC MSMT LEADCHNL RV SENSING INTR AMPL: 11.2 mV
MDC IDC SESS DTM: 20160620114804
MDC IDC STAT BRADY AP VS PERCENT: 0 %
MDC IDC STAT BRADY AS VS PERCENT: 0 %

## 2014-06-28 NOTE — Patient Instructions (Signed)
Medication Instructions:  Your physician recommends that you continue on your current medications as directed. Please refer to the Current Medication list given to you today.   Labwork: None ordered  Testing/Procedures: None ordered  Follow-Up: Your physician wants you to follow-up in: 12 months with Chanetta Marshall, NP You will receive a reminder letter in the mail two months in advance. If you don't receive a letter, please call our office to schedule the follow-up appointment.  Remote monitoring is used to monitor your Pacemaker or ICD from home. This monitoring reduces the number of office visits required to check your device to one time per year. It allows [place] to keep an eye on the functioning of your device to ensure it is working properly. You are scheduled for a device check from home on 09/27/14. You may send your transmission at any time that day. If you have a wireless device, the transmission will be sent automatically. After your physician reviews your transmission, you will receive a postcard with your next transmission date.    Any Other Special Instructions Will Be Listed Below (If Applicable).

## 2014-06-29 DIAGNOSIS — I441 Atrioventricular block, second degree: Secondary | ICD-10-CM | POA: Insufficient documentation

## 2014-06-29 NOTE — Progress Notes (Signed)
PCP: [name], MD Primary Cardiologist:  Dr Aundra Dubin is a 81 y.o. male who presents today for routine electrophysiology followup.  Since his recent PPM implant, the patient reports doing very well.  He has had no further syncope.  His energy has improved.  Today, he denies symptoms of palpitations, chest pain, shortness of breath,  lower extremity edema, dizziness, presyncope, or syncope.  The patient is otherwise without complaint today.   Past Medical History  Diagnosis Date  . GERD (gastroesophageal reflux disease)   . Hypertension   . Blind     Right enucleation  . Hearing loss   . Coronary artery disease     Minimal LAD stenosis 2007  . BPH (benign prostatic hypertrophy)   . Hyperlipidemia   . Varicose vein   . Peripheral neuropathy   . Tremor, essential   . Aortic sclerosis     2008  . Aortic stenosis     Mild, echo, January, 2014  . Atrial flutter     December, 2013  . Chronic anticoagulation     Apixaban started December 27, 2011  . Edema     December, 2013  . CHF (congestive heart failure)   . Hearing difficulty of both ears     hearing aids  . Urine retention 01/08/2013  . Depression   . Urinary catheter in place   . Syncope 07/06/2013   Past Surgical History  Procedure Laterality Date  . Lung surgery      Benign lump removed  . Tumor excision      behindleft eye,abpve left eye,left foot  . Esophagogastroduodenoscopy N/A 06/23/2012    Procedure: ESOPHAGOGASTRODUODENOSCOPY (EGD);  Surgeon: Ladene Artist, MD;  Location: [facility];  Service: Endoscopy;  Laterality: N/A;  . Upper gastrointestinal endoscopy    . Cataract extraction Right   . Eye surgery Left     removal eyeball  . Transurethral resection of prostate N/A 03/11/2013    Procedure: TRANSURETHRAL RESECTION OF THE PROSTATE WITH GYRUS INSTRUMENTS;  Surgeon: Alexis Frock, MD;  Location: [facility];  Service: Urology;  Laterality: N/A;  . Permanent pacemaker insertion N/A 03/30/2014     Procedure: PERMANENT PACEMAKER INSERTION;  Surgeon: [name], MD;  Location: [facility] CATH LAB;  Service: Cardiovascular;  Laterality: N/A;  . Peripheral vascular catheterization N/A 06/23/2014    Procedure: Abdominal Aortogram;  Surgeon: Serafina Mitchell, MD;  Location: [facility];  Service: Cardiovascular;  Laterality: N/A;  . Peripheral vascular catheterization N/A 06/23/2014    Procedure: Pelvic Angiography;  Surgeon: Serafina Mitchell, MD;  Location: [facility];  Service: Cardiovascular;  Laterality: N/A;    ROS- all systems are reviewed and negative except as per HPI above  Current Outpatient Prescriptions  Medication Sig Dispense Refill  . clonazePAM (KLONOPIN) 0.5 MG tablet Take 1 tablet (0.5 mg total) by mouth 2 (two) times daily as needed. for anxiety 60 tablet 5  . Coconut Oil 1000 MG CAPS Take 1,000 mg by mouth 2 (two) times daily.    Marland Kitchen ELDERBERRY PO Take 1 capsule by mouth daily.    Marland Kitchen ELIQUIS 5 MG TABS tablet TAKE ONE TABLET BY MOUTH TWICE DAILY 60 tablet 1  . escitalopram (LEXAPRO) 20 MG tablet Take 1 tablet (20 mg total) by mouth daily. 30 tablet 5  . hydrALAZINE (APRESOLINE) 10 MG tablet Take 1 tablet (10 mg total) by mouth every 8 (eight) hours. 90 tablet 5  . isosorbide mononitrate (IMDUR) 30 MG  24 hr tablet Take 0.5 tablets (15 mg total) by mouth daily. 30 tablet 5  . Multiple Vitamin (MULTIVITAMIN WITH MINERALS) TABS tablet Take 1 tablet by mouth daily.    Marland Kitchen omeprazole (PRILOSEC) 40 MG capsule Take 1 capsule (40 mg total) by mouth daily. 30 capsule 5  . potassium chloride SA (K-DUR,KLOR-CON) 20 MEQ tablet Take 20 mEq by mouth daily.    Marland Kitchen torsemide (DEMADEX) 20 MG tablet TAKE ONE TABLET BY MOUTH TWICE DAILY 60 tablet 3  . zolpidem (AMBIEN) 5 MG tablet Take 1 tablet (5 mg total) by mouth at bedtime as needed for sleep. 90 tablet 1   No current facility-administered medications for this visit.    Physical Exam: Filed Vitals:   06/28/14 1117  BP: 122/48   Pulse: 82  Height: 5\' 7"  (1.702 m)  Weight: 86.365 kg (190 lb 6.4 oz)    GEN- The patient is well appearing, alert and oriented x 3 today.   Head- normocephalic, atraumatic Eyes-  Sclera clear, conjunctiva pink Ears- hearing intact Oropharynx- clear Lungs- Clear to ausculation bilaterally, normal work of breathing Chest- pacemaker pocket is well healed Heart- Regular rate and rhythm, no murmurs, rubs or gallops, PMI not laterally displaced GI- soft, NT, ND, + BS Extremities- no clubbing, cyanosis, or edema  Pacemaker interrogation- reviewed in detail today,  See PACEART report ekg today reveals AV pacing  Assessment and Plan:  1. Second degree AV block Normal pacemaker function See Pace Art report No changes today  2. HTN Stable No change required today  3. CAD No ischemic symptoms  Remote monitoring  Return to see EP NP in 1 year Follow-up with Dr Percival Spanish as scheduled

## 2014-07-28 ENCOUNTER — Encounter: Payer: Self-pay | Admitting: Cardiovascular Disease

## 2014-07-28 ENCOUNTER — Ambulatory Visit (INDEPENDENT_AMBULATORY_CARE_PROVIDER_SITE_OTHER): Payer: Medicare HMO | Admitting: Cardiovascular Disease

## 2014-07-28 ENCOUNTER — Other Ambulatory Visit: Payer: Self-pay

## 2014-07-28 VITALS — BP 132/58 | HR 89 | Ht 67.0 in | Wt 189.0 lb

## 2014-07-28 DIAGNOSIS — F410 Panic disorder [episodic paroxysmal anxiety] without agoraphobia: Secondary | ICD-10-CM

## 2014-07-28 DIAGNOSIS — I350 Nonrheumatic aortic (valve) stenosis: Secondary | ICD-10-CM

## 2014-07-28 MED ORDER — ESCITALOPRAM OXALATE 20 MG PO TABS: 20.0000 mg | ORAL_TABLET | Freq: Every day | ORAL | Status: AC

## 2014-07-28 NOTE — Patient Instructions (Signed)
Dr Berry will see you back as needed. 

## 2014-07-28 NOTE — Progress Notes (Signed)
Keith Ellis is a cardiovascular patient of Dr. Percival Spanish and sees Dr. Rayann Heman for his pacemaker. He had an incidentally noted right common femoral vein aneurysm found during a duplex ultrasound ordered by Dr. Percival Spanish for an abdominal bruit. 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 I CT angiogram confirmed this and Dr. Trula Slade who I referred the patient to did a formal angiogram likewise confirm this although no therapy was provided other than angiographic demonstration of the aneurysm. I suspect based on my conversation with the interventional radiologist and the fact that the patient is asymptomatic but this is going to be followed conservatively but I will defer to Dr. Trula Slade for final and definitive treatment if necessary.

## 2014-08-09 ENCOUNTER — Encounter: Payer: Self-pay | Admitting: Physician Assistant

## 2014-08-09 ENCOUNTER — Ambulatory Visit (INDEPENDENT_AMBULATORY_CARE_PROVIDER_SITE_OTHER): Payer: Medicare HMO | Admitting: Family Medicine

## 2014-08-09 ENCOUNTER — Ambulatory Visit (HOSPITAL_COMMUNITY)
Admission: RE | Admit: 2014-08-09 | Discharge: 2014-08-09 | Disposition: A | Discharge status: Home / Self Care | Payer: Medicare HMO | Source: Ambulatory Visit | Attending: Physician Assistant | Admitting: Physician Assistant

## 2014-08-09 ENCOUNTER — Ambulatory Visit: Payer: Medicare HMO

## 2014-08-09 VITALS — BP 144/67 | HR 88 | Temp 97.6°F | Resp 20 | Wt 192.0 lb

## 2014-08-09 DIAGNOSIS — M7989 Other specified soft tissue disorders: Secondary | ICD-10-CM

## 2014-08-09 DIAGNOSIS — Z23 Encounter for immunization: Secondary | ICD-10-CM | POA: Diagnosis not present

## 2014-08-09 DIAGNOSIS — S81802A Unspecified open wound, left lower leg, initial encounter: Secondary | ICD-10-CM

## 2014-08-09 DIAGNOSIS — R609 Edema, unspecified: Secondary | ICD-10-CM

## 2014-08-09 MED ORDER — CEPHALEXIN 500 MG PO CAPS: 500.0000 mg | ORAL_CAPSULE | Freq: Three times a day (TID) | ORAL | Status: AC

## 2014-08-09 NOTE — Patient Instructions (Addendum)
Please go to the [facility] at [employer].  This across from the [facility].      You have an appointment at 3:00pm.   Please arrive at least by 2:45pm.   I want you to return here to our walk-in clinic at [address], so we can properly wrap your leg by either Harrison Mons or Philis Fendt.   You will return to the clinic in 3 days for changing, while we await appointment for wound care.   I am ordering you compression stockings to pick up at the [facility] at [address]  Please wear these daily.  And you will need to elevate your feet at bedtime, and when sitting.

## 2014-08-09 NOTE — Progress Notes (Signed)
[facility] [address], [place] [address] [phone]  Date:  08/09/2014   Name:  Keith Ellis   DOB:  [birth year]   MRN:  [id number]  PCP:  [name], MD    History of Present Illness:  Keith Ellis is a 81 y.o. male patient with PMH of CHF, moderate kidney impairment who presents to [facility] for chief complaint of blisters on left lower leg.  Leg swelling has been present for about 2-3 months.  Patient states that he noticed the wounds at the inner side of left lower leg was draining.  He has also noticed increased swelling at the left leg which he states both are generally the same size.  He has had in the past, which he states is secondary to salt intake.  He denies recent chest pains, abnormal sob.  Though he does state he is sob with walking back forth to the vehicle too fast.  He states he takes his torsemide, though he was not able to confirm his med list with [facility] upon triage.  He has no coughing.  Dizziness is chronic in the morning.  He has attempted to relieve the open wounds with "salve" that he obtained from prior visit from wound care specialist, and bandaged.           Patient Active Problem List   Diagnosis Date Noted  . Second degree AV block 06/29/2014  . Chronic renal disease, stage III 03/29/2014  . RBBB 03/29/2014  . CHF (congestive heart failure) 03/26/2014  . BPH (benign prostatic hypertrophy) with urinary retention 03/11/2013  . Aortic stenosis-mild   . Chronic anticoagulation   . Edema   . Blind left eye 02/08/2011  . Hearing impaired 02/08/2011  . Hyperlipidemia 02/08/2011  . GERD (gastroesophageal reflux disease) 02/08/2011  . Peripheral neuropathy 02/08/2011  . Hypertension   . CAD- mild disease 2007     Past Medical History  Diagnosis Date  . GERD (gastroesophageal reflux disease)   . Hypertension   . Blind     Right enucleation  . Hearing loss   . Coronary artery disease     Minimal LAD stenosis 2007  . BPH  (benign prostatic hypertrophy)   . Hyperlipidemia   . Varicose vein   . Peripheral neuropathy   . Tremor, essential   . Aortic sclerosis     2008  . Aortic stenosis     Mild, echo, January, 2014  . Atrial flutter     December, 2013  . Chronic anticoagulation     Apixaban started December 27, 2011  . Edema     December, 2013  . CHF (congestive heart failure)   . Hearing difficulty of both ears     hearing aids  . Urine retention 01/08/2013  . Depression   . Urinary catheter in place   . Syncope 07/06/2013    Past Surgical History  Procedure Laterality Date  . Lung surgery      Benign lump removed  . Tumor excision      behindleft eye,abpve left eye,left foot  . Esophagogastroduodenoscopy N/A 06/23/2012    Procedure: ESOPHAGOGASTRODUODENOSCOPY (EGD);  Surgeon: Ladene Artist, MD;  Location: [facility];  Service: Endoscopy;  Laterality: N/A;  . Upper gastrointestinal endoscopy    . Cataract extraction Right   . Eye surgery Left     removal eyeball  . Transurethral resection of prostate N/A 03/11/2013    Procedure: TRANSURETHRAL RESECTION OF THE PROSTATE WITH  GYRUS INSTRUMENTS;  Surgeon: Alexis Frock, MD;  Location: [facility];  Service: Urology;  Laterality: N/A;  . Permanent pacemaker insertion N/A 03/30/2014    Procedure: PERMANENT PACEMAKER INSERTION;  Surgeon: [name], MD;  Location: [facility] CATH LAB;  Service: Cardiovascular;  Laterality: N/A;  . Peripheral vascular catheterization N/A 06/23/2014    Procedure: Abdominal Aortogram;  Surgeon: Serafina Mitchell, MD;  Location: [facility];  Service: Cardiovascular;  Laterality: N/A;  . Peripheral vascular catheterization N/A 06/23/2014    Procedure: Pelvic Angiography;  Surgeon: Serafina Mitchell, MD;  Location: [facility];  Service: Cardiovascular;  Laterality: N/A;    History  Substance Use Topics  . Smoking status: Former Smoker    Types: Cigarettes    Quit date: 07/20/1975  . Smokeless tobacco: Former Systems developer   . Alcohol Use: No    Family History  Problem Relation Age of Onset  . Cancer Father     Rectal  . Rectal cancer Father   . Cancer Mother     Breast  . Stomach cancer Neg Hx   . Esophageal cancer Neg Hx   . Colon cancer Neg Hx   . Cancer Sister     Allergies  Allergen Reactions  . Menthol Hives    Medication list has been reviewed and updated.  Current Outpatient Prescriptions on File Prior to Visit  Medication Sig Dispense Refill  . clonazePAM (KLONOPIN) 0.5 MG tablet Take 1 tablet (0.5 mg total) by mouth 2 (two) times daily as needed. for anxiety 60 tablet 5  . Coconut Oil 1000 MG CAPS Take 1,000 mg by mouth 2 (two) times daily.    Marland Kitchen ELDERBERRY PO Take 1 capsule by mouth daily.    Marland Kitchen ELIQUIS 5 MG TABS tablet TAKE ONE TABLET BY MOUTH TWICE DAILY 60 tablet 1  . escitalopram (LEXAPRO) 20 MG tablet Take 1 tablet (20 mg total) by mouth daily. 30 tablet 3  . hydrALAZINE (APRESOLINE) 10 MG tablet Take 1 tablet (10 mg total) by mouth every 8 (eight) hours. 90 tablet 5  . isosorbide mononitrate (IMDUR) 30 MG 24 hr tablet Take 0.5 tablets (15 mg total) by mouth daily. 30 tablet 5  . Multiple Vitamin (MULTIVITAMIN WITH MINERALS) TABS tablet Take 1 tablet by mouth daily.    Marland Kitchen omeprazole (PRILOSEC) 40 MG capsule Take 1 capsule (40 mg total) by mouth daily. 30 capsule 5  . potassium chloride SA (K-DUR,KLOR-CON) 20 MEQ tablet Take 20 mEq by mouth daily.    Marland Kitchen torsemide (DEMADEX) 20 MG tablet TAKE ONE TABLET BY MOUTH TWICE DAILY 60 tablet 3  . zolpidem (AMBIEN) 5 MG tablet Take 1 tablet (5 mg total) by mouth at bedtime as needed for sleep. 90 tablet 1   No current facility-administered medications on file prior to visit.    ROS ROS otherwise unremarkable unless listed above.    Physical Examination: BP 144/67 mmHg  Pulse 88  Temp(Src) 97.6 F (36.4 C) (Oral)  Resp 20  Wt 192 lb (87.091 kg)  SpO2 96% Ideal Body Weight:    Physical Exam Alert, cooperative, oriented x 4.  Lungs  with good air circulation without rhonchi, or wheezing.  RRR with murmur not appreciated at this time.  No gallop or rubs.  Lower extremity edema bilaterally <left leg with pitting 3+.  Open superficial wounds above the malleoli of both medial < lateral of this left leg.  These whep steadily of clear fluid.  Blushing hyperpigmentation extends superiorly to  just below the knee.  No calf pain.  Normal capillary refill.          Assessment and Plan: 81 year old male is here today for chief complaint "blisters" with leg swelling of left leg. -Will insure that there is no clotting in lower extremity with doppler. -Returning to [facility] walk-in to have unna-boot following venous doppler.  Chelle Jacqulynn Cadet or Philis Fendt aware of his return following imaging procedure.   -Patient agrees to TDAP today, placed future order. -Wound care consult appreciated at this time.  Will treat open sores and swelling while awaiting appointment. -Ordering compression stocking, and recommended elevation at night. -Starting keflex tid for 7 days -RTC in 3 days.        Swelling of lower extremity - Plan: Ultrasound doppler venous legs bilat, LE VENOUS, Ambulatory referral to Wound Clinic, cephALEXin (KEFLEX) 500 MG capsule  Multiple open wounds of lower leg, left, initial encounter - Plan: LE VENOUS, Ambulatory referral to Wound Clinic, cephALEXin (KEFLEX) 500 MG capsule  Pitting edema  Need for Tdap vaccination - Plan: Tdap vaccine greater than or equal to 7yo IM, Tdap vaccine greater than or equal to 7yo IM    Ivar Drape, PA-C [facility] 08/09/2014 1:53 PM

## 2014-08-09 NOTE — Progress Notes (Signed)
Bilateral lower extremity venous duplex completed:  No evidence of DVT, superficial thrombosis, or Baker's cyst.   

## 2014-08-12 ENCOUNTER — Ambulatory Visit (INDEPENDENT_AMBULATORY_CARE_PROVIDER_SITE_OTHER): Payer: Medicare HMO | Admitting: Family Medicine

## 2014-08-12 VITALS — BP 136/60 | HR 88 | Temp 98.8°F | Resp 14 | Ht 67.0 in | Wt 194.0 lb

## 2014-08-12 DIAGNOSIS — M7989 Other specified soft tissue disorders: Secondary | ICD-10-CM | POA: Diagnosis not present

## 2014-08-12 LAB — BASIC METABOLIC PANEL WITH GFR
BUN: 21 mg/dL (ref 7–25)
CALCIUM: 8.6 mg/dL (ref 8.6–10.3)
CHLORIDE: 102 mmol/L (ref 98–110)
CO2: 27 mmol/L (ref 20–31)
Creat: 1.18 mg/dL — ABNORMAL HIGH (ref 0.70–1.11)
GFR, Est African American: 66 mL/min (ref >=60)
GFR, Est Non African American: 57 mL/min — ABNORMAL LOW (ref >=60)
Glucose, Bld: 67 mg/dL (ref 65–99)
Potassium: 3.9 mmol/L (ref 3.5–5.3)
Sodium: 141 mmol/L (ref 135–146)

## 2014-08-12 NOTE — Patient Instructions (Signed)
Please return to the clinic in 1 week.   You will get a phone call for the wound care clinic.  If they contact you sooner than one week, then go to that appointment and they will the care of your leg. If you have numbness tingling, concern of drainage, increased pain--then return to urgent medical and family care sooner.

## 2014-08-12 NOTE — Progress Notes (Signed)
[facility] [address] [place] [address] [phone]  Date:  08/12/2014   Name:  Keith Ellis   DOB:  05-Sep-1929   MRN:  [id number]  PCP:  [name], MD    History of Present Illness:  Keith Ellis is a 81 y.o. male patient who presents to [facility] for follow up of leg swelling and wound care.  He was seen 1 week lower extremity swelling with open wounds, and placed in an unna boot after negative venous ultrasound.  He was placed on keflex tid.  Today he reports that there is some pain near the wound.  He also reports less of an appetite, and some anhedonia.  He has no abnormal sob or dizziness.  No chest pain or palpitations.       Patient Active Problem List   Diagnosis Date Noted  . Second degree AV block 06/29/2014  . Chronic renal disease, stage III 03/29/2014  . RBBB 03/29/2014  . CHF (congestive heart failure) 03/26/2014  . BPH (benign prostatic hypertrophy) with urinary retention 03/11/2013  . Aortic stenosis-mild   . Chronic anticoagulation   . Edema   . Blind left eye 02/08/2011  . Hearing impaired 02/08/2011  . Hyperlipidemia 02/08/2011  . GERD (gastroesophageal reflux disease) 02/08/2011  . Peripheral neuropathy 02/08/2011  . Hypertension   . CAD- mild disease 2007     Past Medical History  Diagnosis Date  . GERD (gastroesophageal reflux disease)   . Hypertension   . Blind     Right enucleation  . Hearing loss   . Coronary artery disease     Minimal LAD stenosis 2007  . BPH (benign prostatic hypertrophy)   . Hyperlipidemia   . Varicose vein   . Peripheral neuropathy   . Tremor, essential   . Aortic sclerosis     2008  . Aortic stenosis     Mild, echo, January, 2014  . Atrial flutter     December, 2013  . Chronic anticoagulation     Apixaban started December 27, 2011  . Edema     December, 2013  . CHF (congestive heart failure)   . Hearing difficulty of both ears     hearing aids  . Urine retention 01/08/2013  .  Depression   . Urinary catheter in place   . Syncope 07/06/2013    Past Surgical History  Procedure Laterality Date  . Lung surgery      Benign lump removed  . Tumor excision      behindleft eye,abpve left eye,left foot  . Esophagogastroduodenoscopy N/A 06/23/2012    Procedure: ESOPHAGOGASTRODUODENOSCOPY (EGD);  Surgeon: Ladene Artist, MD;  Location: [facility];  Service: Endoscopy;  Laterality: N/A;  . Upper gastrointestinal endoscopy    . Cataract extraction Right   . Eye surgery Left     removal eyeball  . Transurethral resection of prostate N/A 03/11/2013    Procedure: TRANSURETHRAL RESECTION OF THE PROSTATE WITH GYRUS INSTRUMENTS;  Surgeon: Alexis Frock, MD;  Location: [facility];  Service: Urology;  Laterality: N/A;  . Permanent pacemaker insertion N/A 03/30/2014    Procedure: PERMANENT PACEMAKER INSERTION;  Surgeon: [name], MD;  Location: [facility] CATH LAB;  Service: Cardiovascular;  Laterality: N/A;  . Peripheral vascular catheterization N/A 06/23/2014    Procedure: Abdominal Aortogram;  Surgeon: Serafina Mitchell, MD;  Location: [place];  Service: Cardiovascular;  Laterality: N/A;  . Peripheral vascular catheterization N/A 06/23/2014    Procedure:  Pelvic Angiography;  Surgeon: Serafina Mitchell, MD;  Location: [place] [facility];  Service: Cardiovascular;  Laterality: N/A;    History  Substance Use Topics  . Smoking status: Former Smoker    Types: Cigarettes    Quit date: 07/20/1975  . Smokeless tobacco: Former Systems developer  . Alcohol Use: No    Family History  Problem Relation Age of Onset  . Cancer Father     Rectal  . Rectal cancer Father   . Cancer Mother     Breast  . Stomach cancer Neg Hx   . Esophageal cancer Neg Hx   . Colon cancer Neg Hx   . Cancer Sister     Allergies  Allergen Reactions  . Menthol Hives    Medication list has been reviewed and updated.  Current Outpatient Prescriptions on File Prior to Visit  Medication Sig Dispense Refill  .  cephALEXin (KEFLEX) 500 MG capsule Take 1 capsule (500 mg total) by mouth 3 (three) times daily. 21 capsule 0  . clonazePAM (KLONOPIN) 0.5 MG tablet Take 1 tablet (0.5 mg total) by mouth 2 (two) times daily as needed. for anxiety 60 tablet 5  . Coconut Oil 1000 MG CAPS Take 1,000 mg by mouth 2 (two) times daily.    Marland Kitchen ELDERBERRY PO Take 1 capsule by mouth daily.    Marland Kitchen ELIQUIS 5 MG TABS tablet TAKE ONE TABLET BY MOUTH TWICE DAILY 60 tablet 1  . escitalopram (LEXAPRO) 20 MG tablet Take 1 tablet (20 mg total) by mouth daily. 30 tablet 3  . hydrALAZINE (APRESOLINE) 10 MG tablet Take 1 tablet (10 mg total) by mouth every 8 (eight) hours. 90 tablet 5  . isosorbide mononitrate (IMDUR) 30 MG 24 hr tablet Take 0.5 tablets (15 mg total) by mouth daily. 30 tablet 5  . Multiple Vitamin (MULTIVITAMIN WITH MINERALS) TABS tablet Take 1 tablet by mouth daily.    Marland Kitchen omeprazole (PRILOSEC) 40 MG capsule Take 1 capsule (40 mg total) by mouth daily. 30 capsule 5  . potassium chloride SA (K-DUR,KLOR-CON) 20 MEQ tablet Take 20 mEq by mouth daily.    Marland Kitchen torsemide (DEMADEX) 20 MG tablet TAKE ONE TABLET BY MOUTH TWICE DAILY 60 tablet 3  . zolpidem (AMBIEN) 5 MG tablet Take 1 tablet (5 mg total) by mouth at bedtime as needed for sleep. 90 tablet 1   No current facility-administered medications on file prior to visit.    ROS ROS otherwise unremarkable unless listed above.    Physical Examination: BP 136/60 mmHg  Pulse 88  Temp(Src) 98.8 F (37.1 C) (Oral)  Resp 14  Ht 5\' 7"  (1.702 m)  Wt 194 lb (87.998 kg)  BMI 30.38 kg/m2  SpO2 94% Ideal Body Weight: Weight in (lb) to have BMI = 25: 159.3  Physical Exam  Constitutional: He is oriented to person, place, and time. He appears well-developed and well-nourished. No distress.  HENT:  Head: Normocephalic and atraumatic.  Eyes: EOM are normal. Pupils are equal, round, and reactive to light. No scleral icterus.  Neck: No thyromegaly present.  Cardiovascular: Normal  rate, regular rhythm and normal heart sounds.  Exam reveals no friction rub.   No murmur heard. Pulmonary/Chest: Effort normal and breath sounds normal. No respiratory distress. He has no wheezes.  Musculoskeletal:  Wounds on both the medial and lateral side continue to ooze fluid at the left lower leg.  Hyperpigmentation just distal to knee.    Lymphadenopathy:    He has no cervical adenopathy.  Neurological: He is alert and oriented to person, place, and time.  Skin: Skin is warm and dry. He is not diaphoretic.  Psychiatric: He has a normal mood and affect. His behavior is normal.     Assessment and Plan: 81 year old male with PMH listed above is here today for follow up wound care.  I have replaced the xeroform and unna boot.  Will also check electrolytes and kidney function at this time.  I believe the keflex is decreasing his appetite.  I have advised that he continue the remaining doses.  He will return to clinic in a week, however will be followed by wound care clinic as soon as possible.  Referral sent one week ago.      1. Swelling of lower extremity - Protime-INR - BASIC METABOLIC PANEL WITH GFR   Ivar Drape, PA-C [facility] 08/12/2014 8:14 PM

## 2014-08-13 LAB — PROTIME-INR
INR: 1.88 — ABNORMAL HIGH (ref <1.50)
Prothrombin Time: 21.6 seconds — ABNORMAL HIGH (ref 11.6–15.2)

## 2014-08-13 NOTE — Progress Notes (Signed)
Examined with English PAC after she discussed the patient's problems with me. Significant stasis ulceration on the lateral portion of the ankle which is oozing. Small place on the right anterior area. The whole leg from below the knee down is inflamed with stasis dermatitis appearance. Agree with [employer]. This is going to take some time to heal up, and has an appointment with the wound center pending. Agree with treatment plan.

## 2014-08-16 ENCOUNTER — Telehealth: Payer: Self-pay | Admitting: Physician Assistant

## 2014-08-16 ENCOUNTER — Ambulatory Visit (INDEPENDENT_AMBULATORY_CARE_PROVIDER_SITE_OTHER): Payer: Medicare HMO | Admitting: Emergency Medicine

## 2014-08-16 VITALS — BP 132/58 | HR 81 | Temp 97.6°F | Resp 16 | Wt 191.4 lb

## 2014-08-16 DIAGNOSIS — I83024 Varicose veins of left lower extremity with ulcer of heel and midfoot: Secondary | ICD-10-CM | POA: Diagnosis not present

## 2014-08-16 DIAGNOSIS — I83025 Varicose veins of left lower extremity with ulcer other part of foot: Secondary | ICD-10-CM

## 2014-08-16 DIAGNOSIS — I83023 Varicose veins of left lower extremity with ulcer of ankle: Secondary | ICD-10-CM | POA: Diagnosis not present

## 2014-08-16 DIAGNOSIS — I83022 Varicose veins of left lower extremity with ulcer of calf: Secondary | ICD-10-CM

## 2014-08-16 DIAGNOSIS — IMO0001 Reserved for inherently not codable concepts without codable children: Secondary | ICD-10-CM

## 2014-08-16 DIAGNOSIS — I83028 Varicose veins of left lower extremity with ulcer other part of lower leg: Secondary | ICD-10-CM | POA: Diagnosis not present

## 2014-08-16 DIAGNOSIS — I83021 Varicose veins of left lower extremity with ulcer of thigh: Secondary | ICD-10-CM

## 2014-08-16 DIAGNOSIS — I83029 Varicose veins of left lower extremity with ulcer of unspecified site: Secondary | ICD-10-CM

## 2014-08-16 DIAGNOSIS — M25562 Pain in left knee: Secondary | ICD-10-CM

## 2014-08-16 MED ORDER — TRAMADOL HCL 50 MG PO TABS: 50.0000 mg | ORAL_TABLET | Freq: Three times a day (TID) | ORAL | Status: DC | PRN

## 2014-08-16 NOTE — Telephone Encounter (Signed)
Left message on phone to contact me back to check in mid-week of wound care.  He was placed in unna-boot and advised to follow up in 1 week, unless wound care contacts him sooner.  At this time he is in wound care work queue.

## 2014-08-16 NOTE — Progress Notes (Signed)
Subjective:  Patient ID: Keith Ellis, male    DOB: Sep 24, 1929  Age: 81 y.o. MRN: [id number]  CC: Leg Swelling   HPI Keith Ellis presents  patient has a history of venous stasis ulcerations and swelling in the leg. He was treated with an Unna boot on the fourth but he felt it was too tight he took it off. He is now committed with a sock covering the wounds. He apparently is complaining recently barely a pain denies any other complaint no improvement with over-the-counter medications accompanied the office by his daughter-in-law who is a nurse  History [name] has a past medical history of GERD (gastroesophageal reflux disease); Hypertension; Blind; Hearing loss; Coronary artery disease; BPH (benign prostatic hypertrophy); Hyperlipidemia; Varicose vein; Peripheral neuropathy; Tremor, essential; Aortic sclerosis; Aortic stenosis; Atrial flutter; Chronic anticoagulation; Edema; CHF (congestive heart failure); Hearing difficulty of both ears; Urine retention (01/08/2013); Depression; Urinary catheter in place; and Syncope (07/06/2013).   He has past surgical history that includes Lung surgery; Tumor excision; Esophagogastroduodenoscopy (N/A, 06/23/2012); Upper gastrointestinal endoscopy; Cataract extraction (Right); Eye surgery (Left); Transurethral resection of prostate (N/A, 03/11/2013); permanent pacemaker insertion (N/A, 03/30/2014); Cardiac catheterization (N/A, 06/23/2014); and Cardiac catheterization (N/A, 06/23/2014).   His  family history includes Cancer in his father, mother, and sister; Rectal cancer in his father. There is no history of Stomach cancer, Esophageal cancer, or Colon cancer.  He   reports that he quit smoking about 39 years ago. His smoking use included Cigarettes. He has quit using smokeless tobacco. He reports that he does not drink alcohol or use illicit drugs.  Outpatient Prescriptions Prior to Visit  Medication Sig Dispense Refill  . cephALEXin (KEFLEX) 500 MG capsule  Take 1 capsule (500 mg total) by mouth 3 (three) times daily. 21 capsule 0  . clonazePAM (KLONOPIN) 0.5 MG tablet Take 1 tablet (0.5 mg total) by mouth 2 (two) times daily as needed. for anxiety 60 tablet 5  . Coconut Oil 1000 MG CAPS Take 1,000 mg by mouth 2 (two) times daily.    Marland Kitchen ELDERBERRY PO Take 1 capsule by mouth daily.    Marland Kitchen ELIQUIS 5 MG TABS tablet TAKE ONE TABLET BY MOUTH TWICE DAILY 60 tablet 1  . escitalopram (LEXAPRO) 20 MG tablet Take 1 tablet (20 mg total) by mouth daily. 30 tablet 3  . hydrALAZINE (APRESOLINE) 10 MG tablet Take 1 tablet (10 mg total) by mouth every 8 (eight) hours. 90 tablet 5  . isosorbide mononitrate (IMDUR) 30 MG 24 hr tablet Take 0.5 tablets (15 mg total) by mouth daily. 30 tablet 5  . Multiple Vitamin (MULTIVITAMIN WITH MINERALS) TABS tablet Take 1 tablet by mouth daily.    Marland Kitchen omeprazole (PRILOSEC) 40 MG capsule Take 1 capsule (40 mg total) by mouth daily. 30 capsule 5  . potassium chloride SA (K-DUR,KLOR-CON) 20 MEQ tablet Take 20 mEq by mouth daily.    Marland Kitchen torsemide (DEMADEX) 20 MG tablet TAKE ONE TABLET BY MOUTH TWICE DAILY 60 tablet 3  . zolpidem (AMBIEN) 5 MG tablet Take 1 tablet (5 mg total) by mouth at bedtime as needed for sleep. 90 tablet 1   No facility-administered medications prior to visit.    History   Social History  . Marital Status: Married    Spouse Name: Loran Senters"  . Number of Children: 3  . Years of Education: 8th grade   Occupational History  . [employer]     Retired   Social History Main Topics  .  Smoking status: Former Smoker    Types: Cigarettes    Quit date: 07/20/1975  . Smokeless tobacco: Former Systems developer  . Alcohol Use: No  . Drug Use: No  . Sexual Activity: Not Currently   Other Topics Concern  . None   Social History Narrative   Patient has hostile neighbor with multiple police reports.  Patient is happily married, but son is at home and is not happy which creates anxious and frustrating relationships.     Caffeine Use: 1 cup daily     Review of Systems  Constitutional: Negative for fever, chills and appetite change.  HENT: Negative for congestion, ear pain, postnasal drip, sinus pressure and sore throat.   Eyes: Negative for pain and redness.  Respiratory: Negative for cough, shortness of breath and wheezing.   Cardiovascular: Negative for leg swelling.  Gastrointestinal: Negative for nausea, vomiting, abdominal pain, diarrhea, constipation and blood in stool.  Endocrine: Negative for polyuria.  Genitourinary: Negative for dysuria, urgency, frequency and flank pain.  Musculoskeletal: Negative for gait problem.  Skin: Negative for rash.  Neurological: Negative for weakness and headaches.  Psychiatric/Behavioral: Negative for confusion and decreased concentration. The patient is not nervous/anxious.     Objective:  BP 132/58 mmHg  Pulse 81  Temp(Src) 97.6 F (36.4 C) (Oral)  Resp 16  Wt 191 lb 6.4 oz (86.818 kg)  SpO2 93%  Physical Exam  Constitutional: He is oriented to person, place, and time. He appears well-developed and well-nourished.  HENT:  Head: Normocephalic and atraumatic.  Eyes: Conjunctivae are normal. Pupils are equal, round, and reactive to light.  Pulmonary/Chest: Effort normal.  Musculoskeletal: He exhibits no edema.       Right shoulder: He exhibits swelling.  Neurological: He is alert and oriented to person, place, and time.  Skin: Skin is dry. Lesion noted.  Psychiatric: He has a normal mood and affect. His behavior is normal. Thought content normal.   he has both medial and lateral venous stasis ulcerations that are quite superficial but weeping. He has marked edema of his lower extremity. There is no cellulitis but he has marked swelling.    Assessment & Plan:   Dainel was seen today for leg swelling.  Diagnoses and all orders for this visit:  Venous stasis ulcer, left  Pain in joint, lower leg, left  Other orders -     traMADol (ULTRAM) 50 MG  tablet; Take 1 tablet (50 mg total) by mouth every 8 (eight) hours as needed.   I am having Mr. Vento start on traMADol. I am also having him maintain his ELDERBERRY PO, multivitamin with minerals, Coconut Oil, ELIQUIS, omeprazole, hydrALAZINE, isosorbide mononitrate, torsemide, zolpidem, clonazePAM, potassium chloride SA, escitalopram, and cephALEXin.  Meds ordered this encounter  Medications  . traMADol (ULTRAM) 50 MG tablet    Sig: Take 1 tablet (50 mg total) by mouth every 8 (eight) hours as needed.    Dispense:  30 tablet    Refill:  0   An Unna boot was put from the metatarsal phalangeal joint proximally to the popliteal crease. He was instructed to remove the boot in a week days and then, the office for reapplication. His daughter the nurse will supervise his care. He was given a prescription for tramadol for severe pain  Appropriate red flag conditions were discussed with the patient as well as actions that should be taken.  Patient expressed his understanding.  Follow-up: Return in about 1 week (around 08/23/2014).  Roselee Culver, MD

## 2014-08-16 NOTE — Patient Instructions (Signed)
Stasis Ulcer Stasis ulcers occur in the legs when the circulation is damaged. An ulcer may look like a small hole in the skin.  CAUSES Stasis ulcers occur because your veins do not work properly. Veins have valves that help the blood return to the heart. If these valves do not work right, blood flows backwards and backs up into the veins near the skin. This condition causes the veins to become larger because of increased pressure and may lead to a stasis ulcer. SYMPTOMS   Shallow (superficial) sore on the leg.  Clear drainage or weeping from the sore.  Leg pain or a feeling of heaviness. This may be worse at the end of the day.  Leg swelling.  Skin color changes. DIAGNOSIS  Your caregiver will make a diagnosis by examining your leg. Your caregiver may order tests such as an ultrasound or other studies to evaluate the blood flow of the leg. HOME CARE INSTRUCTIONS   Do not stand or sit in one position for long periods of time. Do not sit with your legs crossed. Rest with your legs raised during the day. If possible, it is best if you can elevate your legs above your heart for 30 minutes, 3 to 4 times a day.  Wear elastic stockings or support hose. Do not wear other tight encircling garments around legs, pelvis, or waist. This causes increased pressure in your veins. If your caregiver has applied compressive medicated wraps, use them as instructed.  Walk as much as possible to increase blood flow. If you are taking long rides in a car or plane, take a break to walk around every 2 hours. If not already on aspirin, take a baby aspirin before long trips unless you have medical reasons that prohibit this.  Raise the foot of your bed at night with 2-inch blocks if approved by your caregiver. This may not be desirable if you have heart failure or breathing problems.  If you get a cut in the skin over the vein and the vein bleeds, lie down with your leg raised and gently clean the area with a clean  cloth. Apply pressure on the cut until the bleeding stops. Then place a dressing on the cut. See your caregiver if it continues to bleed or needs stitches. Also, see your caregiver if you develop an infection.Signs of an infection include a fever, redness, increased pain, and drainage of pus.  If your caregiver has given you a follow-up appointment, it is very important to keep that appointment. Not keeping the appointment could result in a chronic or permanent injury, pain, and disability. If there is any problem keeping the appointment, call your caregiver for assistance. SEEK IMMEDIATE MEDICAL CARE IF:  The ulcer area starts to break down.  You have pain, redness, tenderness, pus, or hard swelling in your leg over a vein or near the ulcer.  Your leg pain is uncomfortable.  You develop an unexplained fever.  You develop chest pain or shortness of breath. Document Released: 09/19/2000 Document Revised: 03/19/2011 Document Reviewed: 04/16/2010 [facility] Patient Information 2015 [place], [place]. This information is not intended to replace advice given to you by your health care provider. Make sure you discuss any questions you have with your health care provider.

## 2014-08-17 NOTE — Telephone Encounter (Signed)
Patient's daughter in-law contacted me yesterday stating that patient has been draining through the bandaging.  I have advised that he rtc for recheck.  I have seen that he was seen by Dr. Ouida Sills and another wound care referrral was sent.  CMA Clarene Critchley contacted wound care today to see where he was in the work queue.  Apparently they do not see the work queue in [facility].  A fax is being sent for referral at this time to expedite this appointment, which is much appreciated.

## 2014-08-18 ENCOUNTER — Ambulatory Visit: Payer: Medicare HMO | Admitting: Neurology

## 2014-08-18 ENCOUNTER — Telehealth: Payer: Self-pay

## 2014-08-18 NOTE — Telephone Encounter (Signed)
Patient canceled appointment at 12:14 PM the day of the appointment.

## 2014-08-22 ENCOUNTER — Ambulatory Visit (INDEPENDENT_AMBULATORY_CARE_PROVIDER_SITE_OTHER): Payer: Medicare HMO | Admitting: Emergency Medicine

## 2014-08-22 VITALS — BP 124/52 | HR 71 | Temp 97.9°F | Resp 14 | Ht 65.0 in | Wt 194.0 lb

## 2014-08-22 DIAGNOSIS — IMO0001 Reserved for inherently not codable concepts without codable children: Secondary | ICD-10-CM

## 2014-08-22 DIAGNOSIS — I83021 Varicose veins of left lower extremity with ulcer of thigh: Secondary | ICD-10-CM | POA: Diagnosis not present

## 2014-08-22 DIAGNOSIS — I83022 Varicose veins of left lower extremity with ulcer of calf: Secondary | ICD-10-CM

## 2014-08-22 DIAGNOSIS — Z111 Encounter for screening for respiratory tuberculosis: Secondary | ICD-10-CM | POA: Diagnosis not present

## 2014-08-22 DIAGNOSIS — I83024 Varicose veins of left lower extremity with ulcer of heel and midfoot: Secondary | ICD-10-CM | POA: Diagnosis not present

## 2014-08-22 DIAGNOSIS — L97909 Non-pressure chronic ulcer of unspecified part of unspecified lower leg with unspecified severity: Secondary | ICD-10-CM

## 2014-08-22 DIAGNOSIS — I83028 Varicose veins of left lower extremity with ulcer other part of lower leg: Secondary | ICD-10-CM | POA: Diagnosis not present

## 2014-08-22 DIAGNOSIS — I83009 Varicose veins of unspecified lower extremity with ulcer of unspecified site: Secondary | ICD-10-CM | POA: Insufficient documentation

## 2014-08-22 DIAGNOSIS — I83025 Varicose veins of left lower extremity with ulcer other part of foot: Secondary | ICD-10-CM | POA: Diagnosis not present

## 2014-08-22 DIAGNOSIS — I83023 Varicose veins of left lower extremity with ulcer of ankle: Secondary | ICD-10-CM

## 2014-08-22 DIAGNOSIS — M79605 Pain in left leg: Secondary | ICD-10-CM | POA: Diagnosis not present

## 2014-08-22 DIAGNOSIS — I83029 Varicose veins of left lower extremity with ulcer of unspecified site: Secondary | ICD-10-CM

## 2014-08-22 NOTE — Patient Instructions (Signed)

## 2014-08-22 NOTE — Progress Notes (Signed)

## 2014-08-22 NOTE — Progress Notes (Addendum)
Subjective:  Patient ID: Keith Ellis, male    DOB: 1929/03/05  Age: 81 y.o. MRN: [id number]  CC: Wound Check and Immunizations   HPI Keith Ellis presents   With need for attention to his venous stasis ulcers. He was seen a week ago and left Unna boot was applied. His daughter is a Marine scientist remove the Unna boot yesterday rewrap the wounds the wounds rein through the dressing. He has pain in his left lower leg. With increased swelling. He has no fever chills. He needs a TB screening test as her moving into an extended care facility on Wednesday  History Keith Ellis has a past medical history of GERD (gastroesophageal reflux disease); Hypertension; Blind; Hearing loss; Coronary artery disease; BPH (benign prostatic hypertrophy); Hyperlipidemia; Varicose vein; Peripheral neuropathy; Tremor, essential; Aortic sclerosis; Aortic stenosis; Atrial flutter; Chronic anticoagulation; Edema; CHF (congestive heart failure); Hearing difficulty of both ears; Urine retention (01/08/2013); Depression; Urinary catheter in place; and Syncope (07/06/2013).   He has past surgical history that includes Lung surgery; Tumor excision; Esophagogastroduodenoscopy (N/A, 06/23/2012); Upper gastrointestinal endoscopy; Cataract extraction (Right); Eye surgery (Left); Transurethral resection of prostate (N/A, 03/11/2013); permanent pacemaker insertion (N/A, 03/30/2014); Cardiac catheterization (N/A, 06/23/2014); and Cardiac catheterization (N/A, 06/23/2014).   His  family history includes Cancer in his father, mother, and sister; Rectal cancer in his father. There is no history of Stomach cancer, Esophageal cancer, or Colon cancer.  He   reports that he quit smoking about 39 years ago. His smoking use included Cigarettes. He has quit using smokeless tobacco. He reports that he does not drink alcohol or use illicit drugs.  Outpatient Prescriptions Prior to Visit  Medication Sig Dispense Refill  . clonazePAM (KLONOPIN) 0.5 MG tablet  Take 1 tablet (0.5 mg total) by mouth 2 (two) times daily as needed. for anxiety 60 tablet 5  . Coconut Oil 1000 MG CAPS Take 1,000 mg by mouth 2 (two) times daily.    Marland Kitchen ELDERBERRY PO Take 1 capsule by mouth daily.    Marland Kitchen ELIQUIS 5 MG TABS tablet TAKE ONE TABLET BY MOUTH TWICE DAILY 60 tablet 1  . escitalopram (LEXAPRO) 20 MG tablet Take 1 tablet (20 mg total) by mouth daily. 30 tablet 3  . hydrALAZINE (APRESOLINE) 10 MG tablet Take 1 tablet (10 mg total) by mouth every 8 (eight) hours. 90 tablet 5  . isosorbide mononitrate (IMDUR) 30 MG 24 hr tablet Take 0.5 tablets (15 mg total) by mouth daily. 30 tablet 5  . Multiple Vitamin (MULTIVITAMIN WITH MINERALS) TABS tablet Take 1 tablet by mouth daily.    Marland Kitchen omeprazole (PRILOSEC) 40 MG capsule Take 1 capsule (40 mg total) by mouth daily. 30 capsule 5  . potassium chloride SA (K-DUR,KLOR-CON) 20 MEQ tablet Take 20 mEq by mouth daily.    Marland Kitchen torsemide (DEMADEX) 20 MG tablet TAKE ONE TABLET BY MOUTH TWICE DAILY 60 tablet 3  . traMADol (ULTRAM) 50 MG tablet Take 1 tablet (50 mg total) by mouth every 8 (eight) hours as needed. 30 tablet 0  . zolpidem (AMBIEN) 5 MG tablet Take 1 tablet (5 mg total) by mouth at bedtime as needed for sleep. 90 tablet 1   No facility-administered medications prior to visit.    Social History   Social History  . Marital Status: Married    Spouse Name: Loran Senters"  . Number of Children: 3  . Years of Education: 8th grade   Occupational History  . [employer]     Retired  Social History Main Topics  . Smoking status: Former Smoker    Types: Cigarettes    Quit date: 07/20/1975  . Smokeless tobacco: Former Systems developer  . Alcohol Use: No  . Drug Use: No  . Sexual Activity: Not Currently   Other Topics Concern  . None   Social History Narrative   Patient has hostile neighbor with multiple police reports.  Patient is happily married, but son is at home and is not happy which creates anxious and frustrating  relationships.    Caffeine Use: 1 cup daily     Review of Systems  Constitutional: Negative for fever, chills and appetite change.  HENT: Negative for congestion, ear pain, postnasal drip, sinus pressure and sore throat.   Eyes: Negative for pain and redness.  Respiratory: Negative for cough, shortness of breath and wheezing.   Cardiovascular: Negative for leg swelling.  Gastrointestinal: Negative for nausea, vomiting, abdominal pain, diarrhea, constipation and blood in stool.  Endocrine: Negative for polyuria.  Genitourinary: Negative for dysuria, urgency, frequency and flank pain.  Musculoskeletal: Negative for gait problem.  Skin: Negative for rash.  Neurological: Negative for weakness and headaches.  Psychiatric/Behavioral: Negative for confusion and decreased concentration. The patient is not nervous/anxious.     Objective:  BP 124/52 mmHg  Pulse 71  Temp(Src) 97.9 F (36.6 C) (Oral)  Resp 14  Ht 5\' 5"  (1.651 m)  Wt 194 lb (87.998 kg)  BMI 32.28 kg/m2  SpO2 96%  Physical Exam  Constitutional: He is oriented to person, place, and time. He appears well-developed and well-nourished.  HENT:  Head: Normocephalic and atraumatic.  Eyes: Conjunctivae are normal. Pupils are equal, round, and reactive to light.  Pulmonary/Chest: Effort normal.  Musculoskeletal: He exhibits no edema.  Neurological: He is alert and oriented to person, place, and time.  Skin: Skin is dry.  Psychiatric: He has a normal mood and affect. His behavior is normal. Thought content normal.    he has medial and lateral venous stasis ulcers. They're draining SN watery fluid. The dressing smell strongly of Pseudomonas.   Assessment & Plan:   Keith Ellis was seen today for wound check and immunizations.  Diagnoses and all orders for this visit:  Venous stasis ulcer, left  Screening for tuberculosis -     TB Skin Test  Pain of left lower extremity   I am having Keith Ellis maintain his ELDERBERRY PO,  multivitamin with minerals, Coconut Oil, ELIQUIS, omeprazole, hydrALAZINE, isosorbide mononitrate, torsemide, zolpidem, clonazePAM, potassium chloride SA, escitalopram, and traMADol.  No orders of the defined types were placed in this encounter.    Appropriate red flag conditions were discussed with the patient as well as actions that should be taken.  Patient expressed his understanding.  Follow-up: Return in 1 week (on 08/29/2014).  Roselee Culver, MD

## 2014-08-24 ENCOUNTER — Ambulatory Visit (INDEPENDENT_AMBULATORY_CARE_PROVIDER_SITE_OTHER): Payer: Medicare HMO | Admitting: Radiology

## 2014-08-24 DIAGNOSIS — Z111 Encounter for screening for respiratory tuberculosis: Secondary | ICD-10-CM

## 2014-08-24 LAB — TB SKIN TEST
INDURATION: 0 mm
TB Skin Test: NEGATIVE

## 2014-08-27 ENCOUNTER — Encounter: Payer: Self-pay | Admitting: Family Medicine

## 2014-08-27 ENCOUNTER — Telehealth: Payer: Self-pay

## 2014-08-27 ENCOUNTER — Emergency Department: Payer: Medicare HMO

## 2014-08-27 ENCOUNTER — Ambulatory Visit (INDEPENDENT_AMBULATORY_CARE_PROVIDER_SITE_OTHER): Payer: Medicare HMO | Admitting: Family Medicine

## 2014-08-27 ENCOUNTER — Observation Stay
Admission: EM | Admit: 2014-08-27 | Discharge: 2014-08-29 | Disposition: A | Discharge status: Home / Self Care | Payer: Medicare HMO | Attending: Internal Medicine | Admitting: Internal Medicine

## 2014-08-27 VITALS — BP 122/64 | HR 66 | Temp 97.6°F | Ht 66.34 in | Wt 193.2 lb

## 2014-08-27 DIAGNOSIS — R609 Edema, unspecified: Secondary | ICD-10-CM | POA: Diagnosis present

## 2014-08-27 DIAGNOSIS — Z950 Presence of cardiac pacemaker: Secondary | ICD-10-CM | POA: Diagnosis not present

## 2014-08-27 DIAGNOSIS — I509 Heart failure, unspecified: Secondary | ICD-10-CM

## 2014-08-27 DIAGNOSIS — IMO0001 Reserved for inherently not codable concepts without codable children: Secondary | ICD-10-CM

## 2014-08-27 DIAGNOSIS — Z7901 Long term (current) use of anticoagulants: Secondary | ICD-10-CM | POA: Insufficient documentation

## 2014-08-27 DIAGNOSIS — I517 Cardiomegaly: Secondary | ICD-10-CM | POA: Insufficient documentation

## 2014-08-27 DIAGNOSIS — R531 Weakness: Secondary | ICD-10-CM | POA: Diagnosis not present

## 2014-08-27 DIAGNOSIS — B965 Pseudomonas (aeruginosa) (mallei) (pseudomallei) as the cause of diseases classified elsewhere: Secondary | ICD-10-CM | POA: Diagnosis not present

## 2014-08-27 DIAGNOSIS — I872 Venous insufficiency (chronic) (peripheral): Secondary | ICD-10-CM | POA: Insufficient documentation

## 2014-08-27 DIAGNOSIS — R600 Localized edema: Secondary | ICD-10-CM | POA: Diagnosis not present

## 2014-08-27 DIAGNOSIS — Z79899 Other long term (current) drug therapy: Secondary | ICD-10-CM | POA: Insufficient documentation

## 2014-08-27 DIAGNOSIS — I83025 Varicose veins of left lower extremity with ulcer other part of foot: Secondary | ICD-10-CM

## 2014-08-27 DIAGNOSIS — R0600 Dyspnea, unspecified: Secondary | ICD-10-CM

## 2014-08-27 DIAGNOSIS — E785 Hyperlipidemia, unspecified: Secondary | ICD-10-CM | POA: Diagnosis not present

## 2014-08-27 DIAGNOSIS — I83022 Varicose veins of left lower extremity with ulcer of calf: Secondary | ICD-10-CM | POA: Diagnosis not present

## 2014-08-27 DIAGNOSIS — N183 Chronic kidney disease, stage 3 unspecified: Secondary | ICD-10-CM

## 2014-08-27 DIAGNOSIS — L97929 Non-pressure chronic ulcer of unspecified part of left lower leg with unspecified severity: Secondary | ICD-10-CM | POA: Insufficient documentation

## 2014-08-27 DIAGNOSIS — I83028 Varicose veins of left lower extremity with ulcer other part of lower leg: Secondary | ICD-10-CM

## 2014-08-27 DIAGNOSIS — I2510 Atherosclerotic heart disease of native coronary artery without angina pectoris: Secondary | ICD-10-CM | POA: Insufficient documentation

## 2014-08-27 DIAGNOSIS — I5031 Acute diastolic (congestive) heart failure: Secondary | ICD-10-CM | POA: Diagnosis not present

## 2014-08-27 DIAGNOSIS — L97919 Non-pressure chronic ulcer of unspecified part of right lower leg with unspecified severity: Secondary | ICD-10-CM | POA: Insufficient documentation

## 2014-08-27 DIAGNOSIS — G629 Polyneuropathy, unspecified: Secondary | ICD-10-CM | POA: Diagnosis not present

## 2014-08-27 DIAGNOSIS — I4892 Unspecified atrial flutter: Secondary | ICD-10-CM | POA: Diagnosis not present

## 2014-08-27 DIAGNOSIS — I129 Hypertensive chronic kidney disease with stage 1 through stage 4 chronic kidney disease, or unspecified chronic kidney disease: Secondary | ICD-10-CM | POA: Insufficient documentation

## 2014-08-27 DIAGNOSIS — R0902 Hypoxemia: Secondary | ICD-10-CM | POA: Insufficient documentation

## 2014-08-27 DIAGNOSIS — I5033 Acute on chronic diastolic (congestive) heart failure: Principal | ICD-10-CM | POA: Insufficient documentation

## 2014-08-27 DIAGNOSIS — R0602 Shortness of breath: Secondary | ICD-10-CM | POA: Insufficient documentation

## 2014-08-27 DIAGNOSIS — I83023 Varicose veins of left lower extremity with ulcer of ankle: Secondary | ICD-10-CM

## 2014-08-27 DIAGNOSIS — N400 Enlarged prostate without lower urinary tract symptoms: Secondary | ICD-10-CM | POA: Insufficient documentation

## 2014-08-27 DIAGNOSIS — K219 Gastro-esophageal reflux disease without esophagitis: Secondary | ICD-10-CM | POA: Diagnosis not present

## 2014-08-27 DIAGNOSIS — B9620 Unspecified Escherichia coli [E. coli] as the cause of diseases classified elsewhere: Secondary | ICD-10-CM | POA: Diagnosis not present

## 2014-08-27 DIAGNOSIS — I4891 Unspecified atrial fibrillation: Secondary | ICD-10-CM | POA: Diagnosis not present

## 2014-08-27 DIAGNOSIS — G250 Essential tremor: Secondary | ICD-10-CM | POA: Diagnosis not present

## 2014-08-27 DIAGNOSIS — F329 Major depressive disorder, single episode, unspecified: Secondary | ICD-10-CM | POA: Insufficient documentation

## 2014-08-27 DIAGNOSIS — G4700 Insomnia, unspecified: Secondary | ICD-10-CM | POA: Diagnosis not present

## 2014-08-27 DIAGNOSIS — I350 Nonrheumatic aortic (valve) stenosis: Secondary | ICD-10-CM | POA: Diagnosis not present

## 2014-08-27 DIAGNOSIS — Z87891 Personal history of nicotine dependence: Secondary | ICD-10-CM | POA: Diagnosis not present

## 2014-08-27 DIAGNOSIS — I83021 Varicose veins of left lower extremity with ulcer of thigh: Secondary | ICD-10-CM | POA: Diagnosis not present

## 2014-08-27 DIAGNOSIS — I83024 Varicose veins of left lower extremity with ulcer of heel and midfoot: Secondary | ICD-10-CM

## 2014-08-27 DIAGNOSIS — I83029 Varicose veins of left lower extremity with ulcer of unspecified site: Secondary | ICD-10-CM

## 2014-08-27 DIAGNOSIS — M869 Osteomyelitis, unspecified: Secondary | ICD-10-CM

## 2014-08-27 DIAGNOSIS — Z91048 Other nonmedicinal substance allergy status: Secondary | ICD-10-CM | POA: Insufficient documentation

## 2014-08-27 LAB — BASIC METABOLIC PANEL
Anion gap: 9 (ref 5–15)
BUN: 21 mg/dL — ABNORMAL HIGH (ref 6–20)
CHLORIDE: 105 mmol/L (ref 101–111)
CO2: 29 mmol/L (ref 22–32)
CREATININE: 1.2 mg/dL (ref 0.61–1.24)
Calcium: 9 mg/dL (ref 8.9–10.3)
GFR calc non Af Amer: 54 mL/min — ABNORMAL LOW (ref >60)
Glucose, Bld: 101 mg/dL — ABNORMAL HIGH (ref 65–99)
POTASSIUM: 4 mmol/L (ref 3.5–5.1)
SODIUM: 143 mmol/L (ref 135–145)

## 2014-08-27 LAB — WOUND CULTURE: GRAM STAIN: NONE SEEN

## 2014-08-27 LAB — CBC WITH DIFFERENTIAL/PLATELET
BASOS PCT: 1 %
Basophils Absolute: 0 10*3/uL (ref 0–0.1)
EOS ABS: 0.1 10*3/uL (ref 0–0.7)
Eosinophils Relative: 3 %
HCT: 42.1 % (ref 40.0–52.0)
HEMOGLOBIN: 13.7 g/dL (ref 13.0–18.0)
Lymphocytes Relative: 16 %
Lymphs Abs: 0.9 10*3/uL — ABNORMAL LOW (ref 1.0–3.6)
MCH: 28.9 pg (ref 26.0–34.0)
MCHC: 32.5 g/dL (ref 32.0–36.0)
MCV: 88.9 fL (ref 80.0–100.0)
Monocytes Absolute: 0.5 10*3/uL (ref 0.2–1.0)
Monocytes Relative: 9 %
NEUTROS PCT: 71 %
Neutro Abs: 4.1 10*3/uL (ref 1.4–6.5)
Platelets: 126 10*3/uL — ABNORMAL LOW (ref 150–440)
RBC: 4.74 MIL/uL (ref 4.40–5.90)
RDW: 15.8 % — ABNORMAL HIGH (ref 11.5–14.5)
WBC: 5.7 10*3/uL (ref 3.8–10.6)

## 2014-08-27 LAB — TROPONIN I: Troponin I: <0.03 ng/mL (ref <0.031)

## 2014-08-27 LAB — BRAIN NATRIURETIC PEPTIDE: B NATRIURETIC PEPTIDE 5: 1065 pg/mL — AB (ref 0.0–100.0)

## 2014-08-27 LAB — MRSA PCR SCREENING: MRSA by PCR: NEGATIVE

## 2014-08-27 MED ORDER — CLONAZEPAM 0.5 MG PO TABS
0.5000 mg | ORAL_TABLET | Freq: Two times a day (BID) | ORAL | Status: DC | PRN
Start: 2014-08-27 — End: 2014-08-29
  Administered 2014-08-27 – 2014-08-29 (×2): 0.5 mg via ORAL
  Filled 2014-08-27 (×2): qty 1

## 2014-08-27 MED ORDER — PIPERACILLIN-TAZOBACTAM 4.5 G IVPB
4.5000 g | Freq: Three times a day (TID) | INTRAVENOUS | Status: DC
  Administered 2014-08-27 – 2014-08-28 (×2): 4.5 g via INTRAVENOUS
  Filled 2014-08-27 (×5): qty 100

## 2014-08-27 MED ORDER — PANTOPRAZOLE SODIUM 40 MG PO TBEC
40.0000 mg | DELAYED_RELEASE_TABLET | Freq: Every day | ORAL | Status: DC
  Administered 2014-08-28 – 2014-08-29 (×2): 40 mg via ORAL
  Filled 2014-08-27 (×2): qty 1

## 2014-08-27 MED ORDER — TRAMADOL HCL 50 MG PO TABS
50.0000 mg | ORAL_TABLET | Freq: Two times a day (BID) | ORAL | Status: DC | PRN
  Administered 2014-08-27 – 2014-08-28 (×2): 50 mg via ORAL
  Filled 2014-08-27 (×2): qty 1

## 2014-08-27 MED ORDER — PIPERACILLIN-TAZOBACTAM 3.375 G IVPB 30 MIN
3.3750 g | INTRAVENOUS | Status: AC
  Administered 2014-08-27: 3.375 g via INTRAVENOUS
  Filled 2014-08-27: qty 50

## 2014-08-27 MED ORDER — FUROSEMIDE 10 MG/ML IJ SOLN
20.0000 mg | Freq: Two times a day (BID) | INTRAMUSCULAR | Status: DC
  Administered 2014-08-28 – 2014-08-29 (×3): 20 mg via INTRAVENOUS
  Filled 2014-08-27 (×3): qty 2

## 2014-08-27 MED ORDER — ISOSORBIDE MONONITRATE ER 30 MG PO TB24
15.0000 mg | ORAL_TABLET | Freq: Every day | ORAL | Status: DC
  Administered 2014-08-28 – 2014-08-29 (×2): 15 mg via ORAL
  Filled 2014-08-27 (×2): qty 1

## 2014-08-27 MED ORDER — FUROSEMIDE 10 MG/ML IJ SOLN
40.0000 mg | Freq: Once | INTRAMUSCULAR | Status: AC
  Administered 2014-08-27: 40 mg via INTRAVENOUS
  Filled 2014-08-27: qty 4

## 2014-08-27 MED ORDER — ZOLPIDEM TARTRATE 5 MG PO TABS
5.0000 mg | ORAL_TABLET | Freq: Every evening | ORAL | Status: DC | PRN
  Administered 2014-08-28: 5 mg via ORAL
  Filled 2014-08-27: qty 1

## 2014-08-27 MED ORDER — ESCITALOPRAM OXALATE 10 MG PO TABS
20.0000 mg | ORAL_TABLET | Freq: Every day | ORAL | Status: DC
  Administered 2014-08-28 – 2014-08-29 (×2): 20 mg via ORAL
  Filled 2014-08-27 (×2): qty 2

## 2014-08-27 MED ORDER — ADULT MULTIVITAMIN W/MINERALS CH
1.0000 | ORAL_TABLET | Freq: Every day | ORAL | Status: DC
  Administered 2014-08-28 – 2014-08-29 (×2): 1 via ORAL
  Filled 2014-08-27 (×2): qty 1

## 2014-08-27 MED ORDER — APIXABAN 5 MG PO TABS
5.0000 mg | ORAL_TABLET | Freq: Two times a day (BID) | ORAL | Status: DC
  Administered 2014-08-27 – 2014-08-29 (×4): 5 mg via ORAL
  Filled 2014-08-27 (×4): qty 1

## 2014-08-27 MED ORDER — HYDRALAZINE HCL 10 MG PO TABS
10.0000 mg | ORAL_TABLET | Freq: Three times a day (TID) | ORAL | Status: DC
  Administered 2014-08-27 – 2014-08-29 (×6): 10 mg via ORAL
  Filled 2014-08-27 (×8): qty 1

## 2014-08-27 NOTE — Progress Notes (Signed)
Patient ID: BRYNDEN THUNE, male   DOB: 1929-11-30, 81 y.o.   MRN: [id number]  Tommi Rumps, MD Phone: [phone]  LINDEN TAGLIAFERRO is a 81 y.o. male who presents today for new patient visit.  CHF: increasing shortness of breath over the past several weeks. Notes quickly getting short of breath with exertion. No CP. Increasing orthopnea. No PND. No dyspnea now at rest. Has been on demedex and taking daily. Daughter in law notes that this issue has gotten worse recently. Notes increasing weight over the past couple of months. Has not checked his oxygen recently.   Left LE venous stasis ulcer: notes this has been an on going issue with 3 ulcers previously. Now with two as 2 of the prior ulcers grew together. Was getting unna boots for this though he was soaking through them. Notes that he has had green stuff on the dressing. No fevers or erythema Notes there is pain in the area and was placed on tramadol. Treated with keflex 2 weeks ago. Had wound culture that has grown pseudomonas, though no sensititivies noted.   Active Ambulatory Problems    Diagnosis Date Noted  . Hypertension   . CAD- mild disease 2007   . Blind left eye 02/08/2011  . Hearing impaired 02/08/2011  . Hyperlipidemia 02/08/2011  . GERD (gastroesophageal reflux disease) 02/08/2011  . Peripheral neuropathy 02/08/2011  . Aortic stenosis-mild   . Chronic anticoagulation   . Edema   . BPH (benign prostatic hypertrophy) with urinary retention 03/11/2013  . CHF (congestive heart failure) 03/26/2014  . Chronic renal disease, stage III 03/29/2014  . RBBB 03/29/2014  . Second degree AV block 06/29/2014  . Venous stasis ulcer 08/22/2014  . Acute diastolic CHF (congestive heart failure) 08/27/2014   Resolved Ambulatory Problems    Diagnosis Date Noted  . Aortic sclerosis   . Abnormal EKG 07/20/2010  . Obese 07/20/2010  . BPH (benign prostatic hyperplasia) 02/08/2011  . Atrial flutter- recurrent with slow VR   . Other  dysphagia 06/23/2012  . Foreign body in esophagus 06/23/2012  . Urinary retention 01/08/2013  . AKI (acute kidney injury) 01/08/2013  . Acute renal failure 01/08/2013  . Syncope- 2 episodes, two weeks ago 07/06/2013  . S/P TURP (status post transurethral resection of prostate) 03/11/2013  . UTI (lower urinary tract infection) 07/07/2013  . Acute on chronic diastolic CHF (congestive heart failure), NYHA class 3 03/27/2014  . Bradycardia    Past Medical History  Diagnosis Date  . Blind   . Hearing loss   . BPH (benign prostatic hypertrophy)   . Varicose vein   . Tremor, essential   . Hearing difficulty of both ears   . Urine retention 01/08/2013  . Depression   . Urinary catheter in place     Family History  Problem Relation Age of Onset  . Cancer Father     Rectal  . Rectal cancer Father   . Cancer Mother     Breast  . Stomach cancer Neg Hx   . Esophageal cancer Neg Hx   . Colon cancer Neg Hx   . Cancer Sister     Social History   Social History  . Marital Status: Married    Spouse Name: Loran Senters"  . Number of Children: 3  . Years of Education: 8th grade   Occupational History  . [employer]     Retired   Social History Main Topics  . Smoking status: Former Smoker    Types:  Cigarettes    Quit date: 07/20/1975  . Smokeless tobacco: Former Systems developer  . Alcohol Use: No  . Drug Use: No  . Sexual Activity: Not Currently   Other Topics Concern  . Not on file   Social History Narrative   Patient has hostile neighbor with multiple police reports.  Patient is happily married, but son is at home and is not happy which creates anxious and frustrating relationships.    Caffeine Use: 1 cup daily    Review of Systems  Constitutional: Negative for fever, chills and weight loss.  HENT: Negative for hearing loss, sore throat and tinnitus.   Eyes: Negative for blurred vision and double vision.  Respiratory: Positive for shortness of breath. Negative for cough  and hemoptysis.   Cardiovascular: Positive for orthopnea, leg swelling and PND. Negative for chest pain.  Gastrointestinal: Positive for constipation. Negative for nausea, vomiting, abdominal pain, diarrhea, blood in stool and melena.  Genitourinary: Negative for dysuria, urgency, frequency and hematuria.       Positive for leakage of urine  Musculoskeletal: Negative for myalgias and joint pain.  Skin:       Positive for ulcer LLE  Neurological: Negative for dizziness, sensory change, focal weakness, loss of consciousness and headaches.  Endo/Heme/Allergies: Negative for polydipsia.  Psychiatric/Behavioral: Negative for depression. The patient is not nervous/anxious.     Objective  Physical Exam Filed Vitals:   08/27/14 1022  BP: 122/64  Pulse: 66  Temp: 97.6 F (36.4 C)  Ambulatory O2 sat 86%  BP Readings from Last 3 Encounters:  08/27/14 168/53  08/27/14 122/64  08/22/14 124/52   Wt Readings from Last 3 Encounters:  08/27/14 189 lb 8 oz (85.957 kg)  08/27/14 193 lb 3.2 oz (87.635 kg)  08/22/14 194 lb (87.998 kg)    Physical Exam  Constitutional: He is well-developed, well-nourished, and in no distress.  HENT:  Head: Normocephalic and atraumatic.  Right Ear: External ear normal.  Left Ear: External ear normal.  Mouth/Throat: Oropharynx is clear and moist. No oropharyngeal exudate.  Eyes: Conjunctivae are normal.  Neck: Neck supple.  Cardiovascular: Normal rate, regular rhythm and normal heart sounds.   Pulmonary/Chest: Effort normal. No respiratory distress. He has no wheezes.  Crackles bilateral bases  Abdominal: Soft. He exhibits no distension. There is no tenderness. There is no rebound and no guarding.  Musculoskeletal: He exhibits edema (3+ pitting edema bilateral LE).  Lymphadenopathy:    He has no cervical adenopathy.  Skin: He is not diaphoretic.  LLE just proximal to the ankle with ulcer on lateral aspect with red base and ulcer on medial aspect with red  base, dressing with green serous material on it, mild tenderness to the area, skin of LLE from ankle to just distal to the knee with hemosiderin deposition   EKG: ventricular pacemaker rhythm  Assessment/Plan:   Acute diastolic CHF (congestive heart failure) Patient with likely acute exacerbation of CHF with increasing SOB, weight gain, edema, and O2 desat. Patient will benefit from work up in the ED for this issue and will be transported via EMS for further evaluation.   Venous stasis ulcer Patient with venous stasis ulceration with culture proven pseudomonal infection. No systemic signs of illness. Patient will need wound care referral. Given patient is being transported to ED will defer antibiotic management to ED physician.     Orders Placed This Encounter  Procedures  . EKG 12-Lead   Tommi Rumps

## 2014-08-27 NOTE — Assessment & Plan Note (Signed)
Patient with likely acute exacerbation of CHF with increasing SOB, weight gain, edema, and O2 desat. Patient will benefit from work up in the ED for this issue and will be transported via EMS for further evaluation.

## 2014-08-27 NOTE — Telephone Encounter (Signed)
Spoke to [employer].  She said she did not call here regarding Sachin Ferencz and that we needed to speak with the facility administrator named [name].  I called [name] at [phone].  She did not answer.  LMVM if they still had questions regarding [facility] medicine to please call us back.

## 2014-08-27 NOTE — H&P (Signed)
[place] [facility] [place] NAME: Keith Ellis    MR#:  [id number]  DATE OF BIRTH:  02-25-1929  DATE OF ADMISSION:  08/27/2014  PRIMARY CARE PHYSICIAN: Tommi Rumps, MD   REQUESTING/REFERRING PHYSICIAN: Enid Skeens  CHIEF COMPLAINT:   Chief Complaint  Patient presents with  . Weakness    HISTORY OF PRESENT ILLNESS: Keith Ellis  is a 81 y.o. male with a known history of hypertension, coronary artery disease, status post pacemaker placement test, aortic stenosis, atrial flutter on Eliquis, diastolic congestive heart failure- due to generalized weakness noted to assisted living place last week. Has been having both lower extremity swelling and some pressure wounds on them and PMD did the swabbing from the wound and send it for the culture which came back with Pseudomonas. He was on Keflex orally for last 10 days for the same reason, but does not know if there is any changes made after getting report of Pseudomonas. He also has been getting increasingly more short of breath with minimal exertion, not eating too good. In ER he is noted to have elevated BNP, and some right-sided possible pleural effusion on chest x-ray. Given to hospitalist team for admission for CHF.  PAST MEDICAL HISTORY:   Past Medical History  Diagnosis Date  . GERD (gastroesophageal reflux disease)   . Hypertension   . Blind     Right enucleation  . Hearing loss   . Coronary artery disease     Minimal LAD stenosis 2007  . BPH (benign prostatic hypertrophy)   . Hyperlipidemia   . Varicose vein   . Peripheral neuropathy   . Tremor, essential   . Aortic sclerosis     2008  . Aortic stenosis     Mild, echo, January, 2014  . Atrial flutter     December, 2013  . Chronic anticoagulation     Apixaban started December 27, 2011  . Edema     December, 2013  . CHF (congestive heart failure)   . Hearing difficulty of both ears     hearing aids  . Urine retention  01/08/2013  . Depression   . Urinary catheter in place   . Syncope 07/06/2013    PAST SURGICAL HISTORY:  Past Surgical History  Procedure Laterality Date  . Lung surgery      Benign lump removed  . Tumor excision      behindleft eye,abpve left eye,left foot  . Esophagogastroduodenoscopy N/A 06/23/2012    Procedure: ESOPHAGOGASTRODUODENOSCOPY (EGD);  Surgeon: Ladene Artist, MD;  Location: [facility];  Service: Endoscopy;  Laterality: N/A;  . Upper gastrointestinal endoscopy    . Cataract extraction Right   . Eye surgery Left     removal eyeball  . Transurethral resection of prostate N/A 03/11/2013    Procedure: TRANSURETHRAL RESECTION OF THE PROSTATE WITH GYRUS INSTRUMENTS;  Surgeon: Alexis Frock, MD;  Location: [facility];  Service: Urology;  Laterality: N/A;  . Permanent pacemaker insertion N/A 03/30/2014    Procedure: PERMANENT PACEMAKER INSERTION;  Surgeon: [name], MD;  Location: [facility] CATH LAB;  Service: Cardiovascular;  Laterality: N/A;  . Peripheral vascular catheterization N/A 06/23/2014    Procedure: Abdominal Aortogram;  Surgeon: Serafina Mitchell, MD;  Location: [place] [facility];  Service: Cardiovascular;  Laterality: N/A;  . Peripheral vascular catheterization N/A 06/23/2014    Procedure: Pelvic Angiography;  Surgeon: Serafina Mitchell, MD;  Location: [place] [facility];  Service: Cardiovascular;  Laterality:  N/A;    SOCIAL HISTORY:  Social History  Substance Use Topics  . Smoking status: Former Smoker    Types: Cigarettes    Quit date: 07/20/1975  . Smokeless tobacco: Former Systems developer  . Alcohol Use: No    FAMILY HISTORY:  Family History  Problem Relation Age of Onset  . Cancer Father     Rectal  . Rectal cancer Father   . Cancer Mother     Breast  . Stomach cancer Neg Hx   . Esophageal cancer Neg Hx   . Colon cancer Neg Hx   . Cancer Sister     DRUG ALLERGIES:  Allergies  Allergen Reactions  . Menthol Hives and Itching    REVIEW OF SYSTEMS:    CONSTITUTIONAL: No fever, positive for fatigue or weakness.  EYES: No blurred or double vision.  EARS, NOSE, AND THROAT: No tinnitus or ear pain.  RESPIRATORY: No cough, positive for shortness of breath, no wheezing or hemoptysis.  CARDIOVASCULAR: No chest pain, orthopnea, edema.  GASTROINTESTINAL: No nausea, vomiting, diarrhea or abdominal pain.  GENITOURINARY: No dysuria, hematuria.  ENDOCRINE: No polyuria, nocturia,  HEMATOLOGY: No anemia, easy bruising or bleeding SKIN: No rash or lesion. MUSCULOSKELETAL: No joint pain or arthritis.  Positive for b/l leg swelling and ulcer. NEUROLOGIC: No tingling, numbness, weakness.  PSYCHIATRY: No anxiety or depression.   MEDICATIONS AT HOME:  Prior to Admission medications   Medication Sig Start Date End Date Taking? Authorizing Provider  apixaban (ELIQUIS) 5 MG TABS tablet Take 5 mg by mouth 2 (two) times daily.   Yes Historical Provider, MD  clonazePAM (KLONOPIN) 0.5 MG tablet Take 1 tablet (0.5 mg total) by mouth 2 (two) times daily as needed. for anxiety Patient taking differently: Take 0.5 mg by mouth 2 (two) times daily as needed for anxiety.  05/20/14  Yes [name], MD  Coconut Oil 1000 MG CAPS Take 1,000 mg by mouth 2 (two) times daily.   Yes Historical Provider, MD  ELDERBERRY PO Take 1 capsule by mouth daily.   Yes Historical Provider, MD  escitalopram (LEXAPRO) 20 MG tablet Take 1 tablet (20 mg total) by mouth daily. 07/28/14  Yes [name], MD  hydrALAZINE (APRESOLINE) 10 MG tablet Take 1 tablet (10 mg total) by mouth every 8 (eight) hours. 04/01/14  Yes Almyra Deforest, PA  isosorbide mononitrate (IMDUR) 30 MG 24 hr tablet Take 0.5 tablets (15 mg total) by mouth daily. 04/01/14  Yes Almyra Deforest, PA  Multiple Vitamin (MULTIVITAMIN WITH MINERALS) TABS tablet Take 1 tablet by mouth daily.   Yes Historical Provider, MD  omeprazole (PRILOSEC) 40 MG capsule Take 1 capsule (40 mg total) by mouth daily. 07/31/13  Yes Shawnee Knapp, MD   potassium chloride SA (K-DUR,KLOR-CON) 20 MEQ tablet Take 20 mEq by mouth daily.   Yes Historical Provider, MD  torsemide (DEMADEX) 20 MG tablet TAKE ONE TABLET BY MOUTH TWICE DAILY Patient taking differently: Take 20 mg by mouth 2 (two) times daily.  04/01/14  Yes Almyra Deforest, PA  traMADol (ULTRAM) 50 MG tablet Take 1 tablet (50 mg total) by mouth every 8 (eight) hours as needed. Patient taking differently: Take 50 mg by mouth every 8 (eight) hours as needed for moderate pain.  08/16/14  Yes Roselee Culver, MD  zolpidem (AMBIEN) 5 MG tablet Take 1 tablet (5 mg total) by mouth at bedtime as needed for sleep. 04/08/14  Yes [name], MD      PHYSICAL EXAMINATION:  VITAL SIGNS: Blood pressure 143/62, pulse 42, temperature 98.3 F (36.8 C), temperature source Oral, resp. rate 13, height 5\' 7"  (1.702 m), weight 90.266 kg (199 lb), SpO2 78 %.  GENERAL:  81 y.o.-year-old patient lying in the bed with no acute distress.  EYES: Pupils equal, round, reactive to light and accommodation. No scleral icterus. Extraocular muscles intact.  HEENT: Head atraumatic, normocephalic. Oropharynx and nasopharynx clear.  NECK:  Supple, no jugular venous distention. No thyroid enlargement, no tenderness.  LUNGS: decreased breath sound on right lower, no wheezing, rales,rhonchi or crepitation. No use of accessory muscles of respiration.  CARDIOVASCULAR: S1, S2 normal. No murmurs, rubs, or gallops.  ABDOMEN: Soft, nontender, nondistended. Bowel sounds present. No organomegaly or mass.  EXTREMITIES: positive for pedal edema, an ulcer on left leg preent, no cyanosis, or clubbing.  NEUROLOGIC: Cranial nerves II through XII are intact. Muscle strength 5/5 in all extremities. Sensation intact. Gait not checked.  PSYCHIATRIC: The patient is alert and oriented x 3.  SKIN: No obvious rash, lesion, or ulcer.   LABORATORY PANEL:   CBC  Recent Labs Lab 08/27/14 1236  WBC 5.7  HGB 13.7  HCT 42.1  PLT 126*  MCV  88.9  MCH 28.9  MCHC 32.5  RDW 15.8*  LYMPHSABS 0.9*  MONOABS 0.5  EOSABS 0.1  BASOSABS 0.0   ------------------------------------------------------------------------------------------------------------------  Chemistries   Recent Labs Lab 08/27/14 1236  NA 143  K 4.0  CL 105  CO2 29  GLUCOSE 101*  BUN 21*  CREATININE 1.20  CALCIUM 9.0   ------------------------------------------------------------------------------------------------------------------ estimated creatinine clearance is 50.9 mL/min (by C-G formula based on Cr of 1.2). ------------------------------------------------------------------------------------------------------------------ No results for input(s): TSH, T4TOTAL, T3FREE, THYROIDAB in the last 72 hours.  Invalid input(s): FREET3   Coagulation profile No results for input(s): INR, PROTIME in the last 168 hours. ------------------------------------------------------------------------------------------------------------------- No results for input(s): DDIMER in the last 72 hours. -------------------------------------------------------------------------------------------------------------------  Cardiac Enzymes  Recent Labs Lab 08/27/14 1236  TROPONINI <0.03   ------------------------------------------------------------------------------------------------------------------ Invalid input(s): POCBNP  ---------------------------------------------------------------------------------------------------------------  Urinalysis    Component Value Date/Time   COLORURINE AMBER* 03/29/2014 1551   APPEARANCEUR CLEAR 03/29/2014 1551   LABSPEC 1.012 03/29/2014 1551   PHURINE 6.0 03/29/2014 1551   GLUCOSEU NEGATIVE 03/29/2014 1551   HGBUR NEGATIVE 03/29/2014 1551   BILIRUBINUR NEGATIVE 03/29/2014 1551   BILIRUBINUR neg 07/22/2013 [place] 03/29/2014 1551   PROTEINUR NEGATIVE 03/29/2014 1551   PROTEINUR trace 07/22/2013 1204    UROBILINOGEN 4.0* 03/29/2014 1551   UROBILINOGEN 2.0 07/22/2013 1204   NITRITE NEGATIVE 03/29/2014 1551   NITRITE neg 07/22/2013 1204   LEUKOCYTESUR NEGATIVE 03/29/2014 1551     RADIOLOGY: Dg Chest Port 1 View  08/27/2014   CLINICAL DATA:  Worsening shortness of breath. Lower extremity swelling. Congestive heart failure.  EXAM: PORTABLE CHEST - 1 VIEW  COMPARISON:  None.  FINDINGS: Mild cardiomegaly noted. Dual lead transvenous pacemaker is seen in appropriate position. Elevation of right hemidiaphragm noted. Subpulmonic right pleural effusion cannot be excluded. No evidence of pulmonary consolidation.  IMPRESSION: Elevated right hemidiaphragm versus subpulmonic right pleural effusion.  Mild cardiomegaly.   Electronically Signed   By: Earle Gell M.D.   On: 08/27/2014 13:07    EKG: Paced rhythm  IMPRESSION AND PLAN:  * Acute diastolic heart failure  IV Lasix, monitor input and output, he had an echocardiogram showing diastolic heart failure in the past so no need to repeat it.  * Cellulitis  Redness and swelling and an ulcer  on the left leg wound  Positive for Pseudomonas, give IV Zosyn for now.   * History of atrial fibrillation Continue Eliquis  * Hypertension Continue hydralazine, Imdur.  * Generalized weakness  Get physical therapy evaluation.  All the records are reviewed and case discussed with ED provider. Management plans discussed with the patient, family and they are in agreement.  CODE STATUS: Advance Directive Documentation        Most Recent Value   Type of Advance Directive  Living will   Pre-existing out of facility DNR order (yellow form or pink MOST form)     "MOST" Form in Place?         TOTAL TIME TAKING CARE OF THIS PATIENT: 50 minutes.    Vaughan Basta M.D on 08/27/2014   Between 7am to 6pm - Pager - [phone]  After 6pm go to [email or web address] - password [email or web address] [facility]  [place] [facility]  Office  [phone]  CC: Primary  care physician; Tommi Rumps, MD

## 2014-08-27 NOTE — Assessment & Plan Note (Signed)
Patient with venous stasis ulceration with culture proven pseudomonal infection. No systemic signs of illness. Patient will need wound care referral. Given patient is being transported to ED will defer antibiotic management to ED physician.

## 2014-08-27 NOTE — ED Provider Notes (Signed)
[facility] Emergency Department Provider Note  ____________________________________________  Time seen: 11:55 AM  I have reviewed the triage vital signs and the nursing notes.   HISTORY  Chief Complaint Weakness  shortness of breath, dyspnea on exertion Peripheral edema with weeping    HPI Keith Ellis is a 81 y.o. male who resides at [facility].He was seeing his medical care provider at [facility] today and they have sent him to the emergency department due to dyspnea on exertion with hypoxia, peripheral edema with weeping, and open wounds on the left leg.  We are told that his oxygen saturation level drops to 86% when he is ambulating. He becomes fatigued and more short of breath. The report from [place] reports he had some chest tightness when he was attempting to walk.  Addition to this, he has a history of peripheral edema which is now worse. He has weeping from both legs (notable drainage on the bed sheet below his legs).  There are open sores on the lower left leg.  The patient is alert and communicative, although he does seem a little bit confused and offers little history himself. He denies any chest pain or shortness of breath currently but does say he gets short of breath when he is up and moving.    Past Medical History  Diagnosis Date  . GERD (gastroesophageal reflux disease)   . Hypertension   . Blind     Right enucleation  . Hearing loss   . Coronary artery disease     Minimal LAD stenosis 2007  . BPH (benign prostatic hypertrophy)   . Hyperlipidemia   . Varicose vein   . Peripheral neuropathy   . Tremor, essential   . Aortic sclerosis     2008  . Aortic stenosis     Mild, echo, January, 2014  . Atrial flutter     December, 2013  . Chronic anticoagulation     Apixaban started December 27, 2011  . Edema     December, 2013  . CHF (congestive heart failure)   . Hearing difficulty of both ears     hearing aids   . Urine retention 01/08/2013  . Depression   . Urinary catheter in place   . Syncope 07/06/2013    Patient Active Problem List   Diagnosis Date Noted  . Venous stasis ulcer 08/22/2014  . Second degree AV block 06/29/2014  . Chronic renal disease, stage III 03/29/2014  . RBBB 03/29/2014  . CHF (congestive heart failure) 03/26/2014  . BPH (benign prostatic hypertrophy) with urinary retention 03/11/2013  . Aortic stenosis-mild   . Chronic anticoagulation   . Edema   . Blind left eye 02/08/2011  . Hearing impaired 02/08/2011  . Hyperlipidemia 02/08/2011  . GERD (gastroesophageal reflux disease) 02/08/2011  . Peripheral neuropathy 02/08/2011  . Hypertension   . CAD- mild disease 2007     Past Surgical History  Procedure Laterality Date  . Lung surgery      Benign lump removed  . Tumor excision      behindleft eye,abpve left eye,left foot  . Esophagogastroduodenoscopy N/A 06/23/2012    Procedure: ESOPHAGOGASTRODUODENOSCOPY (EGD);  Surgeon: Ladene Artist, MD;  Location: [facility];  Service: Endoscopy;  Laterality: N/A;  . Upper gastrointestinal endoscopy    . Cataract extraction Right   . Eye surgery Left     removal eyeball  . Transurethral resection of prostate N/A 03/11/2013    Procedure: TRANSURETHRAL RESECTION OF THE  PROSTATE WITH GYRUS INSTRUMENTS;  Surgeon: Alexis Frock, MD;  Location: [facility];  Service: Urology;  Laterality: N/A;  . Permanent pacemaker insertion N/A 03/30/2014    Procedure: PERMANENT PACEMAKER INSERTION;  Surgeon: [name], MD;  Location: [facility] CATH LAB;  Service: Cardiovascular;  Laterality: N/A;  . Peripheral vascular catheterization N/A 06/23/2014    Procedure: Abdominal Aortogram;  Surgeon: Serafina Mitchell, MD;  Location: [place] [facility];  Service: Cardiovascular;  Laterality: N/A;  . Peripheral vascular catheterization N/A 06/23/2014    Procedure: Pelvic Angiography;  Surgeon: Serafina Mitchell, MD;  Location: [place] [facility];  Service:  Cardiovascular;  Laterality: N/A;    Current Outpatient Rx  Name  Route  Sig  Dispense  Refill  . clonazePAM (KLONOPIN) 0.5 MG tablet   Oral   Take 1 tablet (0.5 mg total) by mouth 2 (two) times daily as needed. for anxiety   60 tablet   5   . Coconut Oil 1000 MG CAPS   Oral   Take 1,000 mg by mouth 2 (two) times daily.         Marland Kitchen ELDERBERRY PO   Oral   Take 1 capsule by mouth daily.         Marland Kitchen ELIQUIS 5 MG TABS tablet      TAKE ONE TABLET BY MOUTH TWICE DAILY   60 tablet   1     PT USUALLY GETS 60 TABS, THIS WAS AN EMERGENCY REF ...   . escitalopram (LEXAPRO) 20 MG tablet   Oral   Take 1 tablet (20 mg total) by mouth daily.   30 tablet   3   . hydrALAZINE (APRESOLINE) 10 MG tablet   Oral   Take 1 tablet (10 mg total) by mouth every 8 (eight) hours.   90 tablet   5   . isosorbide mononitrate (IMDUR) 30 MG 24 hr tablet   Oral   Take 0.5 tablets (15 mg total) by mouth daily.   30 tablet   5   . Multiple Vitamin (MULTIVITAMIN WITH MINERALS) TABS tablet   Oral   Take 1 tablet by mouth daily.         Marland Kitchen omeprazole (PRILOSEC) 40 MG capsule   Oral   Take 1 capsule (40 mg total) by mouth daily.   30 capsule   5   . potassium chloride SA (K-DUR,KLOR-CON) 20 MEQ tablet   Oral   Take 20 mEq by mouth daily.         Marland Kitchen torsemide (DEMADEX) 20 MG tablet      TAKE ONE TABLET BY MOUTH TWICE DAILY   60 tablet   3   . traMADol (ULTRAM) 50 MG tablet   Oral   Take 1 tablet (50 mg total) by mouth every 8 (eight) hours as needed.   30 tablet   0   . zolpidem (AMBIEN) 5 MG tablet   Oral   Take 1 tablet (5 mg total) by mouth at bedtime as needed for sleep. Patient not taking: Reported on 08/27/2014   90 tablet   1     Allergies Menthol  Family History  Problem Relation Age of Onset  . Cancer Father     Rectal  . Rectal cancer Father   . Cancer Mother     Breast  . Stomach cancer Neg Hx   . Esophageal cancer Neg Hx   . Colon cancer Neg Hx   .  Cancer Sister  Social History Social History  Substance Use Topics  . Smoking status: Former Smoker    Types: Cigarettes    Quit date: 07/20/1975  . Smokeless tobacco: Former Systems developer  . Alcohol Use: No    Review of Systems  Constitutional: Negative for fever. ENT: Negative for sore throat. Cardiovascular: Negative for chest pain. Respiratory: Positive for dyspnea on exertion. See history of present illness. Gastrointestinal: Negative for abdominal pain, vomiting and diarrhea. Genitourinary: Negative for dysuria. Musculoskeletal: Notable bilateral edema in his legs. Skin: Open areas on either side of his left lower leg. Neurological: Negative for headaches   10-point ROS otherwise negative.  ____________________________________________   PHYSICAL EXAM:  VITAL SIGNS: ED Triage Vitals  Enc Vitals Group     BP 08/27/14 1209 155/67 mmHg     Pulse --      Resp 08/27/14 1209 16     Temp 08/27/14 1209 98.3 F (36.8 C)     Temp Source 08/27/14 1209 Oral     SpO2 08/27/14 1205 96 %     Weight 08/27/14 1209 199 lb (90.266 kg)     Height 08/27/14 1209 5' 7"  (1.702 m)     Head Cir --      Peak Flow --      Pain Score --      Pain Loc --      Pain Edu? --      Excl. in [place]? --     Constitutional:  Alert and does respond to questions. He is no acute distress. ENT   Head: Normocephalic and atraumatic.   Nose: No congestion/rhinnorhea. Cardiovascular: Normal rate, regular rhythm, no murmur noted Respiratory:  Normal respiratory effort, no tachypnea.    Breath sounds are overall clear, but there is a diminished quality on the right. Gastrointestinal: Soft and nontender. No distention.  Back: No muscle spasm, no tenderness, no CVA tenderness. Musculoskeletal: No deformity noted. Nontender with normal range of motion in all extremities. There is extensive edema in both lower legs with some skin breakdown and weeping.  Neurologic:  Patient does answer questions and  provide simple answers. He appears a little bit confused or overwhelmed by the transported to the emergency department. No gross focal neurologic deficits are appreciated.  Skin:  There is mild erythema to bilateral lower extremities with weeping from the notable peripheral edema. There is an area of denudation on either side of his left lower leg.Marland Kitchen Psychiatric: Pleasant, alert, calm, but possibly mildly confused. ____________________________________________    LABS (pertinent positives/negatives)  Labs Reviewed  CBC WITH DIFFERENTIAL/PLATELET - Abnormal; Notable for the following:    RDW 15.8 (*)    Platelets 126 (*)    Lymphs Abs 0.9 (*)    All other components within normal limits  BASIC METABOLIC PANEL - Abnormal; Notable for the following:    Glucose, Bld 101 (*)    BUN 21 (*)    GFR calc non Af Amer 54 (*)    All other components within normal limits  BRAIN NATRIURETIC PEPTIDE - Abnormal; Notable for the following:    B Natriuretic Peptide 1065.0 (*)    All other components within normal limits  TROPONIN I     ____________________________________________   EKG  ED ECG REPORT I, Valeen Borys W, the attending physician, personally viewed and interpreted this ECG.   Date: 08/27/2014  EKG Time: 12:39 PM  Rate: 65  Rhythm: Dual-chamber pacemaker. No further interpretation  Axis: -67  Intervals: QRS 198, QTc 536  ____________________________________________    RADIOLOGY  Chest x-ray:  IMPRESSION: Elevated right hemidiaphragm versus subpulmonic right pleural effusion.  Mild cardiomegaly  ____________________________________________  ____________________________________________   INITIAL IMPRESSION / ASSESSMENT AND PLAN / ED COURSE  Pertinent labs & imaging results that were available during my care of the patient were reviewed by me and considered in my medical decision making (see chart for details).  Pleasant, alert, 81 year old male. Slightly  slow to respond, some possible mild confusion, but overall communicative. Significant edema in both legs with weeping. Skin breakdown with loss of skin on either side of his left lower leg. Hypoxia when ambulating at his primary physician's.  We will treat with Lasix, 40 mg, due to the fluid overload. Labs are pending. We will consider admission hospital for further diuresis given his significant edema in the noted hypoxia at his primary care's office.  ----------------------------------------- 1:43 PM on 08/27/2014 -----------------------------------------  At this time, the patient appears in no acute distress. I have met his daughter-in-law and obtain additional history. The wounds on either side of the left leg apparently recultured recently with pseudomonas as results.  I have spoken with Dr. Neal Dy to arrange for consultation for admission. He will see the patient emergency department.  ____________________________________________   FINAL CLINICAL IMPRESSION(S) / ED DIAGNOSES  Final diagnoses:  Venous stasis ulcer, left  Chronic renal disease, stage III  Peripheral edema      Ahmed Prima, MD 08/27/14 1344

## 2014-08-27 NOTE — Telephone Encounter (Signed)
Sounds like the patient has been out of this medication for about 6 weeks.  If he is sleeping well, he doesn't need it.  If he is NOT sleeping well, we can consider restarting it.

## 2014-08-27 NOTE — ED Notes (Signed)
Patient brought in via EMS from [facility] on [address].  Patient lives at [facility] and went to doctors office today for weakness and swelling of bilateral lower legs. EMS called to Doctors office due to hypoxia and weakness.

## 2014-08-27 NOTE — Telephone Encounter (Signed)
Keith Ellis from [facility] assisted [facility] called about Keith Ellis latest [facility] that she received; it has Ambien listed on it, however the Ambien was not included in the bag that Keith Ellis family sent with him. She wants to know if he needs to be taking this, and if so, does she need to call the pharmacy to request this? Please call Keith Ellis at [phone]

## 2014-08-27 NOTE — Progress Notes (Signed)
Pre visit review using our clinic review tool, if applicable. No additional management support is needed unless otherwise documented below in the visit note. 

## 2014-08-27 NOTE — Telephone Encounter (Signed)
Ambien is listed on patient's medication list and a 90 day supply was prescribed by Dr. Joseph Art on 04/08/2014.  Patient's family wants to know if he should be taking this medication.  It is not in his medication bag.

## 2014-08-27 NOTE — Progress Notes (Signed)
ANTIBIOTIC CONSULT NOTE - INITIAL  Pharmacy Consult for Zosyn Indication: Pseudomonas cellulitis  Allergies  Allergen Reactions  . Menthol Hives and Itching    Patient Measurements: Height: 5\' 7"  (170.2 cm) Weight: 199 lb (90.266 kg) IBW/kg (Calculated) : 66.1  Vital Signs: Temp: 98.3 F (36.8 C) (08/19 1209) Temp Source: Oral (08/19 1209) BP: 143/58 mmHg (08/19 1430) Pulse Rate: 42 (08/19 1400) Intake/Output from previous day:   Intake/Output from this shift: Total I/O In: -  Out: 475 [Urine:475]  Labs:  Recent Labs  08/27/14 1236  WBC 5.7  HGB 13.7  PLT 126*  CREATININE 1.20   Estimated Creatinine Clearance: 50.9 mL/min (by C-G formula based on Cr of 1.2). No results for input(s): VANCOTROUGH, VANCOPEAK, VANCORANDOM, GENTTROUGH, GENTPEAK, GENTRANDOM, TOBRATROUGH, TOBRAPEAK, TOBRARND, AMIKACINPEAK, AMIKACINTROU, AMIKACIN in the last 72 hours.   Microbiology: Recent Results (from the past 720 hour(s))  Wound culture     Status: None   Collection Time: 08/22/14  2:35 PM  Result Value Ref Range Status   Culture   Final    Abundant PSEUDOMONAS AERUGINOSA Abundant ESCHERICHIA COLI    Gram Stain Rare  Final   Gram Stain WBC present-predominately PMN  Final   Gram Stain No Squamous Epithelial Cells Seen  Final   Gram Stain Abundant Gram Negative Rods  Final   Gram Stain Few Gram Positive Cocci In Pairs  Final   Organism ID, Bacteria PSEUDOMONAS AERUGINOSA  Final   Organism ID, Bacteria ESCHERICHIA COLI  Final      Susceptibility   Escherichia coli -  (no method available)    AMPICILLIN <=2 Sensitive     AMOX/CLAVULANIC <=2 Sensitive     AMPICILLIN/SULBACTAM <=2 Sensitive     PIP/TAZO <=4 Sensitive     IMIPENEM <=0.25 Sensitive     CEFTRIAXONE <=1 Sensitive     CEFTAZIDIME <=1 Sensitive     CEFEPIME <=1 Sensitive     GENTAMICIN <=1 Sensitive     TOBRAMYCIN <=1 Sensitive     CIPROFLOXACIN <=0.25 Sensitive     LEVOFLOXACIN <=0.12 Sensitive    TRIMETH/SULFA* <=20 Sensitive      * ORAL therapy:A cefazolin MIC of <32 predicts susceptibility to the oral agents cefaclor,cefdinir,cefpodoxime,cefprozil,cefuroxime,cephalexin,and loracarbef when used for therapy of uncomplicated UTIs due to E.coli,K.pneumomiae,and P.mirabilis. PARENTERAL therapy: A cefazolinMIC of >8 indicates resistance to parenteralcefazolin. An alternate test method must beperformed to confirm susceptibility to parenteralcefazolin.   Pseudomonas aeruginosa -  (no method available)    PIP/TAZO 8 Sensitive     IMIPENEM 1 Sensitive     CEFTRIAXONE  Resistant     CEFTAZIDIME 4 Sensitive     CEFEPIME 2 Sensitive     GENTAMICIN 2 Sensitive     TOBRAMYCIN <=1 Sensitive     CIPROFLOXACIN <=0.25 Sensitive     LEVOFLOXACIN 1 Sensitive     Medical History: Past Medical History  Diagnosis Date  . GERD (gastroesophageal reflux disease)   . Hypertension   . Blind     Right enucleation  . Hearing loss   . Coronary artery disease     Minimal LAD stenosis 2007  . BPH (benign prostatic hypertrophy)   . Hyperlipidemia   . Varicose vein   . Peripheral neuropathy   . Tremor, essential   . Aortic sclerosis     2008  . Aortic stenosis     Mild, echo, January, 2014  . Atrial flutter     December, 2013  . Chronic anticoagulation  Apixaban started December 27, 2011  . Edema     December, 2013  . CHF (congestive heart failure)   . Hearing difficulty of both ears     hearing aids  . Urine retention 01/08/2013  . Depression   . Urinary catheter in place   . Syncope 07/06/2013    Medications:  Scheduled:  . furosemide  20 mg Intravenous Q12H   Infusions:  . piperacillin-tazobactam    . piperacillin-tazobactam     PRN:   Assessment: 81 y/o M with cellulitis and wound culture growing Pseudomonas and E. coli.   Goal of Therapy:  Resolution of infection  Plan:  Zosyn 3.375 g iv once then 4.5 g EI q 8 hours.   Ulice Dash D 08/27/2014,2:52 PM

## 2014-08-27 NOTE — Progress Notes (Signed)
Patient admitted to unit. Oriented to room, call bell, and staff. Bed in lowest position. Fall safety plan reviewed. Full assessment to Epic. Skin assessment verified with Truitt Leep RN. Telemetry box verification with tele clerk- Box#: [id number]. Will continue to monitor.

## 2014-08-27 NOTE — Evaluation (Signed)
Physical Therapy Evaluation Patient Details Name: Keith Ellis MRN: [id number] DOB: 26-Oct-1929 Today's Date: 08/27/2014   History of Present Illness  Patient is an 81 y/o male that presents with venous statis ulcer and is s/p pacemaker placement. Patient with PMH - afib, peripheral neuropathy, HTN, syncope, L eye enucleation.   Clinical Impression  Patient presents from ALF, where he has only been for ~ 1 week. Patient displays mild balance deficits with SPC during ambulation today as well as decreased stride lengths, indicative of a falls risk. PT tried patient out on a RW, which improved his modified DGI score, stride length, and gait speed. Patient advised to use RW at home. Patient otherwise appears at or very near his mobility baseline and would benefit from HHPT for added balance activities. Patient would continue to benefit from skilled PT services to address his balance deficits.     Follow Up Recommendations Home health PT    Equipment Recommendations  Rolling walker with 5" wheels    Recommendations for Other Services       Precautions / Restrictions Precautions Precautions: Fall Restrictions Weight Bearing Restrictions: No      Mobility  Bed Mobility Overal bed mobility: Modified Independent             General bed mobility comments: Patient reports   Transfers Overall transfer level: Needs assistance Equipment used: Straight cane Transfers: Sit to/from Stand Sit to Stand: Supervision         General transfer comment: Patient requires 2 attempts to perform sit to stand, otherwise has no loss of balance with cane.   Ambulation/Gait Ambulation/Gait assistance: Supervision Ambulation Distance (Feet): 200 Feet (150' with RW as well) Assistive device: Straight cane Gait Pattern/deviations: Decreased step length - left;Decreased step length - right   Gait velocity interpretation: Below normal speed for age/gender General Gait Details: Patient takes short  stride lengths secondary to decreased LE strength. In gait training patient cued to take longer steps, which he was able to perform with RW.   Stairs            Wheelchair Mobility    Modified Rankin (Stroke Patients Only)       Balance Overall balance assessment: Needs assistance Sitting-balance support: Feet unsupported Sitting balance-Leahy Scale: Good     Standing balance support: Single extremity supported Standing balance-Leahy Scale: Fair Standing balance comment: Patient is able to ambulate with SPC with modified DGI of 9/12, indicating he is at higher risk for falling with SPC. Switched to [employer].                              Pertinent Vitals/Pain Pain Assessment:  (Patient does not report any pain during this session.)    Home Living Family/patient expects to be discharged to:: Assisted living               Home Equipment: Walker - 2 wheels;Cane - single point      Prior Function Level of Independence: Independent with assistive device(s)         Comments: Patient states he has recently been using SPC.      Hand Dominance   Dominant Hand: Right    Extremity/Trunk Assessment   Upper Extremity Assessment: Overall WFL for tasks assessed           Lower Extremity Assessment: Overall WFL for tasks assessed         Communication   Communication: [facility]  Cognition Arousal/Alertness: Awake/alert Behavior During Therapy: WFL for tasks assessed/performed Overall Cognitive Status: Within Functional Limits for tasks assessed                      General Comments      Exercises        Assessment/Plan    PT Assessment Patient needs continued PT services  PT Diagnosis Difficulty walking;Generalized weakness   PT Problem List Decreased strength;Cardiopulmonary status limiting activity;Decreased activity tolerance;Decreased mobility;Decreased balance  PT Treatment Interventions DME instruction;Gait training;Therapeutic  activities;Therapeutic exercise   PT Goals (Current goals can be found in the Care Plan section) Acute Rehab PT Goals Patient Stated Goal: To return home  PT Goal Formulation: With patient/family Time For Goal Achievement: 09/10/14 Potential to Achieve Goals: Good    Frequency Min 2X/week   Barriers to discharge        Co-evaluation               End of Session Equipment Utilized During Treatment: Gait belt Activity Tolerance: Patient tolerated treatment well Patient left: in bed;with call bell/phone within reach;with bed alarm set      Functional Assessment Tool Used: Modified DGI, clinical judgement  Functional Limitation: Mobility: Walking and moving around Mobility: Walking and Moving Around Current Status (N3614): At least 1 percent but less than 20 percent impaired, limited or restricted Mobility: Walking and Moving Around Goal Status [phone]): At least 1 percent but less than 20 percent impaired, limited or restricted    Time: 0086-7619 PT Time Calculation (min) (ACUTE ONLY): 25 min   Charges:   PT Evaluation $Initial PT Evaluation Tier I: 1 Procedure PT Treatments $Gait Training: 8-22 mins   PT G Codes:   PT G-Codes **NOT FOR INPATIENT CLASS** Functional Assessment Tool Used: Modified DGI, clinical judgement  Functional Limitation: Mobility: Walking and moving around Mobility: Walking and Moving Around Current Status (J0932): At least 1 percent but less than 20 percent impaired, limited or restricted Mobility: Walking and Moving Around Goal Status [phone]): At least 1 percent but less than 20 percent impaired, limited or restricted    Kerman Passey, PT, DPT    08/27/2014, 5:10 PM

## 2014-08-27 NOTE — Progress Notes (Signed)
Per Dr. Anselm Jungling, okay to place wound nurse consult to see patient for leg wounds

## 2014-08-28 ENCOUNTER — Observation Stay: Payer: Medicare HMO

## 2014-08-28 LAB — CBC
HEMATOCRIT: 39.4 % — AB (ref 40.0–52.0)
Hemoglobin: 12.8 g/dL — ABNORMAL LOW (ref 13.0–18.0)
MCH: 28.7 pg (ref 26.0–34.0)
MCHC: 32.6 g/dL (ref 32.0–36.0)
MCV: 88.1 fL (ref 80.0–100.0)
PLATELETS: 108 10*3/uL — AB (ref 150–440)
RBC: 4.47 MIL/uL (ref 4.40–5.90)
RDW: 15.9 % — AB (ref 11.5–14.5)
WBC: 4.8 10*3/uL (ref 3.8–10.6)

## 2014-08-28 LAB — BASIC METABOLIC PANEL
Anion gap: 8 (ref 5–15)
BUN: 20 mg/dL (ref 6–20)
CHLORIDE: 106 mmol/L (ref 101–111)
CO2: 27 mmol/L (ref 22–32)
CREATININE: 1.24 mg/dL (ref 0.61–1.24)
Calcium: 8.8 mg/dL — ABNORMAL LOW (ref 8.9–10.3)
GFR calc Af Amer: >60 mL/min (ref >60)
GFR calc non Af Amer: 52 mL/min — ABNORMAL LOW (ref >60)
GLUCOSE: 89 mg/dL (ref 65–99)
POTASSIUM: 3.6 mmol/L (ref 3.5–5.1)
Sodium: 141 mmol/L (ref 135–145)

## 2014-08-28 MED ORDER — PIPERACILLIN-TAZOBACTAM 3.375 G IVPB
3.3750 g | Freq: Three times a day (TID) | INTRAVENOUS | Status: DC
  Administered 2014-08-28 – 2014-08-29 (×3): 3.375 g via INTRAVENOUS
  Filled 2014-08-28 (×7): qty 50

## 2014-08-28 NOTE — Progress Notes (Signed)
Initial Nutrition Assessment  INTERVENTION:   Meals and Snacks: Cater to patient preferences Medical Food Supplement Therapy: will send supplement on follow if intake poor.   NUTRITION DIAGNOSIS:   Increased nutrient needs related to wound healing as evidenced by estimated needs.  GOAL:   Patient will meet greater than or equal to 90% of their needs  MONITOR:    (energy Intake, Anthropometrics, Glucose Profile)  REASON FOR ASSESSMENT:   Diagnosis, Malnutrition Screening Tool    ASSESSMENT:   Pt admitted with lower leg cellulitis and h/o CHF. Wound care following. Pt sound asleep on visit.   Past Medical History  Diagnosis Date  . GERD (gastroesophageal reflux disease)   . Hypertension   . Blind     Right enucleation  . Hearing loss   . Coronary artery disease     Minimal LAD stenosis 2007  . BPH (benign prostatic hypertrophy)   . Hyperlipidemia   . Varicose vein   . Peripheral neuropathy   . Tremor, essential   . Aortic sclerosis     2008  . Aortic stenosis     Mild, echo, January, 2014  . Atrial flutter     December, 2013  . Chronic anticoagulation     Apixaban started December 27, 2011  . Edema     December, 2013  . CHF (congestive heart failure)   . Hearing difficulty of both ears     hearing aids  . Urine retention 01/08/2013  . Depression   . Urinary catheter in place   . Syncope 07/06/2013     Diet Order:  Diet heart healthy/carb modified Room service appropriate?: Yes; Fluid consistency:: Thin    Current Nutrition: Pt ate 90% of  Breakfast this am, coffee and Sprite left in pt room with only sips taken.   Food/Nutrition-Related History: Per MST no decrease in appetite PTA.   Medications: MVI, Lasix, Protonix  Electrolyte/Renal Profile and Glucose Profile:   Recent Labs Lab 08/27/14 1236 08/28/14 0452  NA 143 141  K 4.0 3.6  CL 105 106  CO2 29 27  BUN 21* 20  CREATININE 1.20 1.24  CALCIUM 9.0 8.8*  GLUCOSE 101* 89   Protein  Profile: No results for input(s): ALBUMIN in the last 168 hours.  Gastrointestinal Profile: Last BM:  08/26/2014    Nutrition-Focused Physical Exam Findings:  Unable to complete Nutrition-Focused physical exam at this time.    Weight Change: Per CHL weight relatively stable. RD notes three weights recorded yesterday with variance 189lbs, 199lbs and 193lbs. RD also notes 2+ BLLE edema.   Height:   Ht Readings from Last 1 Encounters:  08/27/14 5\' 7"  (1.702 m)    Weight:   Wt Readings from Last 1 Encounters:  08/27/14 189 lb 8 oz (85.957 kg)   Wt Readings from Last 10 Encounters:  08/27/14 189 lb 8 oz (85.957 kg)  08/27/14 193 lb 3.2 oz (87.635 kg)  08/22/14 194 lb (87.998 kg)  08/16/14 191 lb 6.4 oz (86.818 kg)  08/12/14 194 lb (87.998 kg)  08/09/14 192 lb (87.091 kg)  07/28/14 189 lb (85.73 kg)  06/28/14 190 lb 6.4 oz (86.365 kg)  06/23/14 184 lb (83.462 kg)  06/14/14 193 lb 3.2 oz (87.635 kg)    BMI:  Body mass index is 29.67 kg/(m^2).  Estimated Nutritional Needs:   Kcal:  1984-2345kcals, BEE: 1504kcals, TEE: (IF 1.1-1.3)(AF 1.2)  Protein:  93-110g protein (1.1-1.3g/kg)  Fluid:  2125-2552mL of fluid (25-66mL/kg)  EDUCATION NEEDS:  Education needs no appropriate at this time  [place], [place], LDN Pager [phone]

## 2014-08-28 NOTE — Progress Notes (Signed)
[name] [place] NAME: Keith Ellis    MR#:  [id number]  DATE OF BIRTH:  1929/01/13  SUBJECTIVE:  Patient has no acute issues. Patient says he has been on antibiotics for his lower external he ulcer. Patient denies shortness of breath. He says his lotion edema has improved.  REVIEW OF SYSTEMS:    Review of Systems  Constitutional: Negative for fever, chills and malaise/fatigue.  HENT: Negative for sore throat.   Eyes: Negative for blurred vision.  Respiratory: Negative for cough, hemoptysis, shortness of breath and wheezing.   Cardiovascular: Positive for leg swelling. Negative for chest pain and palpitations.  Gastrointestinal: Negative for nausea, vomiting, abdominal pain, diarrhea and blood in stool.  Genitourinary: Negative for dysuria.  Musculoskeletal: Negative for back pain.  Skin:       Ulcer left lower extremity  Neurological: Negative for dizziness, tremors and headaches.  Endo/Heme/Allergies: Does not bruise/bleed easily.    Tolerating Diet: Yes      DRUG ALLERGIES:   Allergies  Allergen Reactions  . Menthol Hives and Itching    VITALS:  Blood pressure 144/57, pulse 61, temperature 98.3 F (36.8 C), temperature source Oral, resp. rate 16, height 5\' 7"  (1.702 m), weight 85.957 kg (189 lb 8 oz), SpO2 97 %.  PHYSICAL EXAMINATION:   Physical Exam  Constitutional: He is oriented to person, place, and time and well-developed, well-nourished, and in no distress. No distress.  HENT:  Head: Normocephalic.  Eyes: No scleral icterus.  Neck: Normal range of motion. Neck supple. No JVD present. No tracheal deviation present.  Cardiovascular: Normal rate, regular rhythm and normal heart sounds.  Exam reveals no gallop and no friction rub.   No murmur heard. Pulmonary/Chest: Effort normal and breath sounds normal. No respiratory distress. He has no wheezes. He has no rales. He exhibits no tenderness.  Abdominal:  Soft. Bowel sounds are normal. He exhibits no distension and no mass. There is no tenderness. There is no rebound and no guarding.  Musculoskeletal: Normal range of motion. He exhibits edema.  Neurological: He is alert and oriented to person, place, and time.  Skin: Skin is warm. No rash noted. No erythema.  There is a ulcer on the left lateral lower extremity which is weeping also he has small ulcer on the right shin and medial left leg  Psychiatric: Affect and judgment normal.      LABORATORY PANEL:   CBC  Recent Labs Lab 08/28/14 0452  WBC 4.8  HGB 12.8*  HCT 39.4*  PLT 108*   ------------------------------------------------------------------------------------------------------------------  Chemistries   Recent Labs Lab 08/28/14 0452  NA 141  K 3.6  CL 106  CO2 27  GLUCOSE 89  BUN 20  CREATININE 1.24  CALCIUM 8.8*   ------------------------------------------------------------------------------------------------------------------  Cardiac Enzymes  Recent Labs Lab 08/27/14 1432 08/27/14 1839 08/27/14 2138  TROPONINI <0.03 <0.03 <0.03   ------------------------------------------------------------------------------------------------------------------  RADIOLOGY:  Dg Chest Port 1 View  08/27/2014   CLINICAL DATA:  Worsening shortness of breath. Lower extremity swelling. Congestive heart failure.  EXAM: PORTABLE CHEST - 1 VIEW  COMPARISON:  None.  FINDINGS: Mild cardiomegaly noted. Dual lead transvenous pacemaker is seen in appropriate position. Elevation of right hemidiaphragm noted. Subpulmonic right pleural effusion cannot be excluded. No evidence of pulmonary consolidation.  IMPRESSION: Elevated right hemidiaphragm versus subpulmonic right pleural effusion.  Mild cardiomegaly.   Electronically Signed   By: Earle Gell M.D.   On: 08/27/2014 13:07  ASSESSMENT AND PLAN:   This is an 81 year old male with a history of diastolic heart failure, pacemaker,  aortic stenosis, atrial flutter on Eliquis, Pseudomonas and Escherichia coli lower extremity ulcers who presented from assisted living with generalized weakness and shortness of breath.   1. Acute on chronic diastolic heart failure: Patient had an echocardiogram March she thousand 16 which showed normal ejection fraction however he does have grade 2 diastolic dysfunction. Patient's currently on Lasix. I will continue Lasix. Continue to monitor input and output.  2. Pseudomonas and Escherichia coli lower extremity ulcer: I will order a CT scan to evaluate possible myelitis he cannot have an MRI due to the fact that he has a pacemaker. I will continue on Zosyn. Both Escherichia coli and Pseudomonas was then sedated to this antibiotics. His cultures were about a week ago. Wound care consultation is also been requested.  3. History of atrial fibrillation: Patient will continue on Eliquis.  4. Essential hypertension: Patient continue on hydralazine and Imdur.  5. Generalized weakness: Patient will have physical therapy consultation and case management consultation. Patient is currently a resident at assisted living.     Management plans discussed with the patient and he is in agreement.  CODE STATUS: Full  TOTAL TIME TAKING CARE OF THIS PATIENT: 35 minutes.     POSSIBLE D/C tomorrow, DEPENDING ON CLINICAL CONDITION.   Yanel Dombrosky M.D on 08/28/2014 at 12:19 PM  Between 7am to 6pm - Pager - [phone] After 6pm go to [email or web address] - password [email or web address] [facility]  [facility]  Office  [phone]  CC: Primary care physician; Tommi Rumps, MD

## 2014-08-28 NOTE — Consult Note (Signed)
WOC wound consult note Reason for Consult:Bilateral LE ulcerations (full thickness).  Patient has been followed by the outpatient wound care center at [facility] in [place] in the past, but is not a patient there at this time. He reports that his daughter-in-law, who is a Marine scientist, has been assisting him by wrapping his legs in 3 layers in the recent past, but "not lately".  He moved to an assisted living facility within the last week.  These ulcerations have been present about 1 month, he reports. He has some sort of "pump" that he places on his legs at night to "keep the fluid from building up".  He does not have his "pump" at the ALF, he reports. Wound type:venous insufficiency, chronic Pressure Ulcer POA: No Measurement: Right pretibial ulceration:  2cm x 2cm x 0.2cm.  Wound is covered with a thin layer of epidermis that has peeled away slightly to reveal a ruddy red wound bed.  This wound is not moist.  Left medial malleolus:  5.5cm x 4cm x 0.2cm with moist, red wound bed.  Left lateral malleolus:  8cm x 7cm x 0.2cm wound moist, red wound bed. Wound bed:As described above. Drainage (amount, consistency, odor) Wounds are not actively exudating at this time, although there is serous staining (dried) on the underpad placed beneath the left LE. Periwound: Hemosiderin staining is evident bilaterally as well as edema.  No induration, no erythema is noted. Dressing procedure/placement/frequency: Orders for daily wound care for the Nursing staff are provided and consist of an alginate dressing following by mild compression with a dry boot (Kerlix + ACE). Upon discharge, patient referral to the outpatient wound care center at [facility] could be considered as he has been followed there in the past; [facility] could provide application of twice weekly [employer] with follow up by that care center as they direct. There is another outpatient wound care center affiliated with [facility] that may be more convenient  for the family and could be considered. If you agree, please discuss with patient/family and make arrangements/referral to the center of their choosing. Investigation into the location of the sequential compression device that the patient describes should be considered. Thank you for this consultation.  The [facility] nursing team will not follow, but will remain available to this patient, the nursing and medical teams.  Please re-consult if needed. Thanks, Maudie Flakes, MSN, RN, [place], [place], [place] [phone])

## 2014-08-28 NOTE — Progress Notes (Signed)
ANTIBIOTIC CONSULT NOTE - F/U  Pharmacy Consult for Zosyn Indication: Pseudomonas cellulitis  Allergies  Allergen Reactions  . Menthol Hives and Itching    Patient Measurements: Height: 5\' 7"  (170.2 cm) Weight: 189 lb 8 oz (85.957 kg) IBW/kg (Calculated) : 66.1  Vital Signs: Temp: 98.3 F (36.8 C) (08/20 1140) Temp Source: Oral (08/20 0800) BP: 144/57 mmHg (08/20 1140) Pulse Rate: 61 (08/20 1140) Intake/Output from previous day: 08/19 0701 - 08/20 0700 In: 170 [P.O.:120; IV Piggyback:50] Out: 5465 [Urine:1275] Intake/Output from this shift: Total I/O In: 0  Out: 100 [Urine:100]  Labs:  Recent Labs  08/27/14 1236 08/28/14 0452  WBC 5.7 4.8  HGB 13.7 12.8*  PLT 126* 108*  CREATININE 1.20 1.24   Estimated Creatinine Clearance: 48.1 mL/min (by C-G formula based on Cr of 1.24). No results for input(s): VANCOTROUGH, VANCOPEAK, VANCORANDOM, GENTTROUGH, GENTPEAK, GENTRANDOM, TOBRATROUGH, TOBRAPEAK, TOBRARND, AMIKACINPEAK, AMIKACINTROU, AMIKACIN in the last 72 hours.   Microbiology: Recent Results (from the past 720 hour(s))  Wound culture     Status: None   Collection Time: 08/22/14  2:35 PM  Result Value Ref Range Status   Culture   Final    Abundant PSEUDOMONAS AERUGINOSA Abundant ESCHERICHIA COLI    Gram Stain Rare  Final   Gram Stain WBC present-predominately PMN  Final   Gram Stain No Squamous Epithelial Cells Seen  Final   Gram Stain Abundant Gram Negative Rods  Final   Gram Stain Few Gram Positive Cocci In Pairs  Final   Organism ID, Bacteria PSEUDOMONAS AERUGINOSA  Final   Organism ID, Bacteria ESCHERICHIA COLI  Final      Susceptibility   Escherichia coli -  (no method available)    AMPICILLIN <=2 Sensitive     AMOX/CLAVULANIC <=2 Sensitive     AMPICILLIN/SULBACTAM <=2 Sensitive     PIP/TAZO <=4 Sensitive     IMIPENEM <=0.25 Sensitive     CEFTRIAXONE <=1 Sensitive     CEFTAZIDIME <=1 Sensitive     CEFEPIME <=1 Sensitive     GENTAMICIN <=1  Sensitive     TOBRAMYCIN <=1 Sensitive     CIPROFLOXACIN <=0.25 Sensitive     LEVOFLOXACIN <=0.12 Sensitive     TRIMETH/SULFA* <=20 Sensitive      * ORAL therapy:A cefazolin MIC of <32 predicts susceptibility to the oral agents cefaclor,cefdinir,cefpodoxime,cefprozil,cefuroxime,cephalexin,and loracarbef when used for therapy of uncomplicated UTIs due to E.coli,K.pneumomiae,and P.mirabilis. PARENTERAL therapy: A cefazolinMIC of >8 indicates resistance to parenteralcefazolin. An alternate test method must beperformed to confirm susceptibility to parenteralcefazolin.   Pseudomonas aeruginosa -  (no method available)    PIP/TAZO 8 Sensitive     IMIPENEM 1 Sensitive     CEFTRIAXONE  Resistant     CEFTAZIDIME 4 Sensitive     CEFEPIME 2 Sensitive     GENTAMICIN 2 Sensitive     TOBRAMYCIN <=1 Sensitive     CIPROFLOXACIN <=0.25 Sensitive     LEVOFLOXACIN 1 Sensitive   MRSA PCR Screening     Status: None   Collection Time: 08/27/14  5:01 PM  Result Value Ref Range Status   MRSA by PCR NEGATIVE NEGATIVE Final    Comment:        The GeneXpert MRSA Assay (FDA approved for NASAL specimens only), is one component of a comprehensive MRSA colonization surveillance program. It is not intended to diagnose MRSA infection nor to guide or monitor treatment for MRSA infections.     Medical History: Past Medical History  Diagnosis Date  .  GERD (gastroesophageal reflux disease)   . Hypertension   . Blind     Right enucleation  . Hearing loss   . Coronary artery disease     Minimal LAD stenosis 2007  . BPH (benign prostatic hypertrophy)   . Hyperlipidemia   . Varicose vein   . Peripheral neuropathy   . Tremor, essential   . Aortic sclerosis     2008  . Aortic stenosis     Mild, echo, January, 2014  . Atrial flutter     December, 2013  . Chronic anticoagulation     Apixaban started December 27, 2011  . Edema     December, 2013  . CHF (congestive heart failure)   . Hearing difficulty  of both ears     hearing aids  . Urine retention 01/08/2013  . Depression   . Urinary catheter in place   . Syncope 07/06/2013    Medications:  Scheduled:  . apixaban  5 mg Oral BID  . escitalopram  20 mg Oral Daily  . furosemide  20 mg Intravenous Q12H  . hydrALAZINE  10 mg Oral 3 times per day  . isosorbide mononitrate  15 mg Oral Daily  . multivitamin with minerals  1 tablet Oral Daily  . pantoprazole  40 mg Oral Daily  . piperacillin-tazobactam (ZOSYN)  IV  3.375 g Intravenous 3 times per day   Infusions:    PRN:   Assessment: 81 y/o M with cellulitis and wound culture growing Pseudomonas and E. coli.   Goal of Therapy:  Resolution of infection  Plan:  Patient remains afebrile and has normalized WBC. Will continue patient on Zosyn EI 3.375g IV Q8hr.    Pharmacy will continue to monitor and adjust per consult.   Keith Ellis,Keith Ellis 08/28/2014,11:53 AM

## 2014-08-29 LAB — BASIC METABOLIC PANEL
ANION GAP: 12 (ref 5–15)
BUN: 23 mg/dL — AB (ref 6–20)
CALCIUM: 9 mg/dL (ref 8.9–10.3)
CO2: 26 mmol/L (ref 22–32)
CREATININE: 1.36 mg/dL — AB (ref 0.61–1.24)
Chloride: 102 mmol/L (ref 101–111)
GFR calc Af Amer: 54 mL/min — ABNORMAL LOW (ref >60)
GFR calc non Af Amer: 47 mL/min — ABNORMAL LOW (ref >60)
GLUCOSE: 93 mg/dL (ref 65–99)
Potassium: 3.5 mmol/L (ref 3.5–5.1)
Sodium: 140 mmol/L (ref 135–145)

## 2014-08-29 MED ORDER — CLINDAMYCIN HCL 300 MG PO CAPS: 300.0000 mg | ORAL_CAPSULE | Freq: Three times a day (TID) | ORAL | Status: DC

## 2014-08-29 MED ORDER — CIPROFLOXACIN HCL 500 MG PO TABS: 500.0000 mg | ORAL_TABLET | Freq: Two times a day (BID) | ORAL | Status: DC

## 2014-08-29 NOTE — Care Management Note (Signed)
Case Management Note  Patient Details  Name: Keith Ellis MRN: [id number] Date of Birth: 18-Dec-1929  Subjective/Objective:  Faxed and called Emmit Pomfret per referral to [facility] for [place] PT, RN, Aid and instructions for wound care.                  Action/Plan:   Expected Discharge Date:                  Expected Discharge Plan:     In-House Referral:     Discharge planning Services     Post Acute Care Choice:    Choice offered to:     DME Arranged:    DME Agency:     HH Arranged:    [place] Agency:     Status of Service:     Medicare Important Message Given:    Date Medicare IM Given:    Medicare IM give by:    Date Additional Medicare IM Given:    Additional Medicare Important Message give by:     If discussed at [place] of Stay Meetings, dates discussed:    Additional Comments:  Rhydian Baldi A, RN 08/29/2014, 10:41 AM

## 2014-08-29 NOTE — Discharge Summary (Addendum)
[facility] [place] NAME: Keith Ellis    MR#:  [id number]  DATE OF BIRTH:  Apr 08, 1929  DATE OF ADMISSION:  08/27/2014 ADMITTING PHYSICIAN: Vaughan Basta, MD  DATE OF DISCHARGE: 08/29/2014  PRIMARY CARE PHYSICIAN: Tommi Rumps, MD    ADMISSION DIAGNOSIS:  Peripheral edema [R60.9] Chronic renal disease, stage III [N18.3] Venous stasis ulcer, left [I83.021, I83.022, I83.023, I83.024, I83.025, I83.028, I83.029]  DISCHARGE DIAGNOSIS:  Principal Problem:   Acute diastolic CHF (congestive heart failure) Active Problems:   CHF (congestive heart failure)   SECONDARY DIAGNOSIS:   Past Medical History  Diagnosis Date  . GERD (gastroesophageal reflux disease)   . Hypertension   . Blind     Right enucleation  . Hearing loss   . Coronary artery disease     Minimal LAD stenosis 2007  . BPH (benign prostatic hypertrophy)   . Hyperlipidemia   . Varicose vein   . Peripheral neuropathy   . Tremor, essential   . Aortic sclerosis     2008  . Aortic stenosis     Mild, echo, January, 2014  . Atrial flutter     December, 2013  . Chronic anticoagulation     Apixaban started December 27, 2011  . Edema     December, 2013  . CHF (congestive heart failure)   . Hearing difficulty of both ears     hearing aids  . Urine retention 01/08/2013  . Depression   . Urinary catheter in place   . Syncope 07/06/2013    HOSPITAL COURSE:  This is an 81 year old male with a history of diastolic heart failure, pacemaker, aortic stenosis, atrial flutter on Eliquis, Pseudomonas and Escherichia coli lower extremity ulcers who presented from assisted living with generalized weakness and shortness of breath.   1. Acute on chronic diastolic heart failure: Patient had an echocardiogram March 2016 which showed normal ejection fraction however he does have grade 2 diastolic dysfunction. Patient diuresed well with Lasix.  2. Recent Pseudomonas  and Escherichia coli lower extremity ulcer: X-rays did not show evidence of osteomyelitis. Patient will continue on CIPRO. Wound car consult was placed and recommendation should be followed.   3. History of atrial fibrillation: Patient will continue on Eliquis.  4. Essential hypertension: Patient continue on hydralazine and Imdur.  5. Generalized weakness: Patient will  Be discharged with [place] AND DIET:  Heart healthy diet in stable condition Wound care to full thickness ulcerations on the bilateral LEs (right pretibial area, left lateral malleolus and left medial malleolus):  Cleanse with NS, pat gently dry. Apply a calcium alginate dressing Kellie Simmering [phone]) appropriate to the size of the wound to the wound bed.  Allow only 1 inch to lie atop intact periwound skin. wound bed. Top with dry 4x4s abd secure with Kerlix roll gauze wrapped from toe to knee, heel inclusive.  Top Kerlix with 4-inch ACE bandages wrapped in the same manner (from toe to knee, heel inclusive). Change daily.  Elevate LEs on pillows while in bed or chair, float heels.  CONSULTS OBTAINED:     DRUG ALLERGIES:   Allergies  Allergen Reactions  . Menthol Hives and Itching    DISCHARGE MEDICATIONS:   Current Discharge Medication List    START taking these medications   Details  cipro 500 mg po BID for 7 days       CONTINUE these medications which have NOT CHANGED   Details  apixaban (ELIQUIS) 5 MG TABS tablet Take 5 mg by mouth 2 (two) times daily.    clonazePAM (KLONOPIN) 0.5 MG tablet Take 1 tablet (0.5 mg total) by mouth 2 (two) times daily as needed. for anxiety Qty: 60 tablet, Refills: 5    Coconut Oil 1000 MG CAPS Take 1,000 mg by mouth 2 (two) times daily.    ELDERBERRY PO Take 1 capsule by mouth daily.    escitalopram (LEXAPRO) 20 MG tablet Take 1 tablet (20 mg total) by mouth daily. Qty: 30 tablet, Refills: 3   Associated Diagnoses: Panic disorder    hydrALAZINE  (APRESOLINE) 10 MG tablet Take 1 tablet (10 mg total) by mouth every 8 (eight) hours. Qty: 90 tablet, Refills: 5    isosorbide mononitrate (IMDUR) 30 MG 24 hr tablet Take 0.5 tablets (15 mg total) by mouth daily. Qty: 30 tablet, Refills: 5    Multiple Vitamin (MULTIVITAMIN WITH MINERALS) TABS tablet Take 1 tablet by mouth daily.    omeprazole (PRILOSEC) 40 MG capsule Take 1 capsule (40 mg total) by mouth daily. Qty: 30 capsule, Refills: 5    potassium chloride SA (K-DUR,KLOR-CON) 20 MEQ tablet Take 20 mEq by mouth daily.    torsemide (DEMADEX) 20 MG tablet TAKE ONE TABLET BY MOUTH TWICE DAILY Qty: 60 tablet, Refills: 3    traMADol (ULTRAM) 50 MG tablet Take 1 tablet (50 mg total) by mouth every 8 (eight) hours as needed. Qty: 30 tablet, Refills: 0    zolpidem (AMBIEN) 5 MG tablet Take 1 tablet (5 mg total) by mouth at bedtime as needed for sleep. Qty: 90 tablet, Refills: 1   Associated Diagnoses: Insomnia           CIPRO 500 mg PO BID for 7 days   Today   CHIEF COMPLAINT:  No issues overnight   VITAL SIGNS:  Blood pressure 120/54, pulse 60, temperature 98.8 F (37.1 C), temperature source Oral, resp. rate 18, height 5\' 7"  (1.702 m), weight 85.957 kg (189 lb 8 oz), SpO2 96 %.   REVIEW OF SYSTEMS:  Review of Systems  Constitutional: Negative for fever, chills and malaise/fatigue.  HENT: Negative for sore throat.   Eyes: Negative for blurred vision.  Respiratory: Negative for cough, hemoptysis, shortness of breath and wheezing.   Cardiovascular: Positive for leg swelling. Negative for chest pain and palpitations.  Gastrointestinal: Negative for nausea, vomiting, abdominal pain, diarrhea and blood in stool.  Genitourinary: Negative for dysuria.  Musculoskeletal: Negative for back pain.  Skin:       Ulcer LE chronic  Neurological: Negative for dizziness, tremors, focal weakness and headaches.  Endo/Heme/Allergies: Does not bruise/bleed easily.     PHYSICAL  EXAMINATION:  GENERAL:  81 y.o.-year-old patient lying in the bed with no acute distress.  NECK:  Supple, no jugular venous distention. No thyroid enlargement, no tenderness.  LUNGS: Normal breath sounds bilaterally, no wheezing, rales,rhonchi  No use of accessory muscles of respiration.  CARDIOVASCULAR: S1, S2 normal. No murmurs, rubs, or gallops.  ABDOMEN: Soft, non-tender, non-distended. Bowel sounds present. No organomegaly or mass.  EXTREMITIES: No pedal edema, cyanosis, or clubbing.  PSYCHIATRIC: The patient is alert and oriented x 3.  SKIN: + LE ulcers left lateral and right tibia.   DATA REVIEW:   CBC  Recent Labs Lab 08/28/14 0452  WBC 4.8  HGB 12.8*  HCT 39.4*  PLT 108*    Chemistries   Recent Labs Lab 08/29/14 0513  NA 140  K 3.5  CL 102  CO2 26  GLUCOSE 93  BUN 23*  CREATININE 1.36*  CALCIUM 9.0    Cardiac Enzymes  Recent Labs Lab 08/27/14 1432 08/27/14 1839 08/27/14 2138  TROPONINI <0.03 <0.03 <0.03    Microbiology Results  @[email or web address] @  RADIOLOGY:  Dg Tibia/fibula Left  08/28/2014   CLINICAL DATA:  81 year old male with a history of ulcer left lateral lower extremity.  EXAM: LEFT TIBIA AND FIBULA - 2 VIEW  COMPARISON:  None.  FINDINGS: No acute fracture. No significant soft tissue swelling. Degenerative changes at the ankle. No radiopaque foreign body.  IMPRESSION: Negative for acute bony abnormality.  Signed,  Dulcy Fanny. Earleen Newport, DO  [facility]  [facility]   Electronically Signed   By: Corrie Mckusick D.O.   On: 08/28/2014 15:12   Dg Chest Port 1 View  08/27/2014   CLINICAL DATA:  Worsening shortness of breath. Lower extremity swelling. Congestive heart failure.  EXAM: PORTABLE CHEST - 1 VIEW  COMPARISON:  None.  FINDINGS: Mild cardiomegaly noted. Dual lead transvenous pacemaker is seen in appropriate position. Elevation of right hemidiaphragm noted. Subpulmonic right pleural effusion cannot be  excluded. No evidence of pulmonary consolidation.  IMPRESSION: Elevated right hemidiaphragm versus subpulmonic right pleural effusion.  Mild cardiomegaly.   Electronically Signed   By: Earle Gell M.D.   On: 08/27/2014 13:07      Management plans discussed with the patient and he is in agreement. Stable for discharge AL with [place]  Patient should follow up with PCP in 1 week  CODE STATUS:     Code Status Orders        Start     Ordered   08/27/14 1532  Full code   Continuous     08/27/14 1531    Advance Directive Documentation        Most Recent Value   Type of Advance Directive  Living will   Pre-existing out of facility DNR order (yellow form or pink MOST form)     "MOST" Form in Place?        TOTAL TIME TAKING CARE OF THIS PATIENT: 35 minutes.    Kweku Stankey M.D on 08/29/2014 at 10:22 AM  Between 7am to 6pm - Pager - [phone] After 6pm go to [email or web address] - password [email or web address] [facility]  [place] [facility]  Office  [phone]  CC: Primary care physician; Tommi Rumps, MD

## 2014-08-29 NOTE — Clinical Social Work Note (Signed)
Clinical Social Work Assessment  Patient Details  Name: Keith Ellis MRN: [id number] Date of Birth: 08/25/1929  Date of referral:  08/29/14               Reason for consult:  Facility Placement                Permission sought to share information with:    Permission granted to share information::     Name::        Agency::     Relationship::     Contact Information:     Housing/Transportation Living arrangements for the past 2 months:  [place] of Information:  Facility Patient Interpreter Needed:  None Criminal Activity/Legal Involvement Pertinent to Current Situation/Hospitalization:  No - Comment as needed Significant Relationships:  Adult Children Lives with:  Facility Resident Do you feel safe going back to the place where you live?  Yes Need for family participation in patient care:  No (Coment)  Care giving concerns:  It has been determined that patient is a resident of [place]   Facilities manager / plan:  CSW initially informed by physician that patient is ready for discharge and possibly is from an ALF but then physician stated he was not. RN CM informed CSW that she was able to verify that he was from [place]. CSW contacted [place] supervisor: [name] and she confirmed patient is a resident at their ALF and that they can take patient back. CSW coordinated with [name] and their on call pharmacy by sending new abx script to pharmacare. Patient's daughter in law, Keith Ellis, aware of discharge and is going to tranpsort. Fl2 completed. RN CM has arranged home health.  Employment status:  Disabled (Comment on whether or not currently receiving Disability) Insurance information:  Programmer, applications PT Recommendations:  Home with [place] / Referral to community resources:     Patient/Family's Response to care: Pleasant and cooperative   Patient/Family's Understanding of and Emotional Response to Diagnosis, Current  Treatment, and Prognosis:  Patient's daughter in law wanted to ensure home health had been ordered and CSW confirmed this.   Emotional Assessment Appearance:  Appears stated age Attitude/Demeanor/Rapport:   (pleasantly confused) Affect (typically observed):  Appropriate Orientation:  Fluctuating Orientation (Suspected and/or reported Sundowners), Oriented to Self Alcohol / Substance use:  Not Applicable Psych involvement (Current and /or in the community):  No (Comment)  Discharge Needs  Concerns to be addressed:  Care Coordination Readmission within the last 30 days:  No Current discharge risk:  None Barriers to Discharge:  No Barriers Identified   Keith Leff, LCSW 08/29/2014, 11:26 AM

## 2014-08-29 NOTE — Progress Notes (Signed)
Pt to be discharged to [facility] facility today. Iv and tele removed. Left ankle dressing changed with telfa and fluff gauze. Pt to be transported by daughter in [employer]

## 2014-08-29 NOTE — Care Management Note (Signed)
Case Management Note  Patient Details  Name: ERVIN HENSLEY MRN: [id number] Date of Birth: [birth year]  Subjective/Objective:      Called [facility] and was told that Mr Enochs is a resident in the [facility] [place], Room 3. Left a phone message with supervisor Crystal [phone] to please call me with the name of any home health provider that [place] preferred per Mr Dominic will be discharged back there with home health PT, RN, Aid.               Action/Plan:   Expected Discharge Date:                  Expected Discharge Plan:     In-House Referral:     Discharge planning Services     Post Acute Care Choice:    Choice offered to:     DME Arranged:    DME Agency:     HH Arranged:    [place] Agency:     Status of Service:     Medicare Important Message Given:    Date Medicare IM Given:    Medicare IM give by:    Date Additional Medicare IM Given:    Additional Medicare Important Message give by:     If discussed at [place] of Stay Meetings, dates discussed:    Additional Comments:  Stashia Sia A, RN 08/29/2014, 9:47 AM

## 2014-08-30 ENCOUNTER — Telehealth: Payer: Self-pay

## 2014-08-30 NOTE — Telephone Encounter (Signed)
Received Standing Orders for medications from [facility]. It looks like Dr Tommi Rumps is pt's PCP and pt was just in to see him on 08/27/14 before entering hosp. The PCP should really be signing orders for the pt's Assist Living. I faxed back to [place] to advise.

## 2014-08-30 NOTE — Telephone Encounter (Signed)
Transition Care Management Follow-up Telephone Call  How have you been since you were released from the hospital?  Not to good, feel bad every morning.  Once he eats he feels better.  Patient is residing at a assisted living.  Can be reached at [phone]   Do you understand why you were in the hospital? Water blisters on his legs, and something to with a blood blister per the patient.  Reviewed his diagnosis with the patient.   Do you understand the discharge instrcutions? Didn't understand them except that he knows someone should be helping him with his medications and changing his dressings.     Items Reviewed:  Medications reviewed: Yes, Reviewed with him that he is on a antibiotic for 7 days and that he continued all of his other medications.  He said that the girls at [facility] will help him with them.    Allergies reviewed: Yes  Dietary changes reviewed: yes, he understands that he needs to eat but doesn't have a big appetite.    Referrals reviewed: None/N/A   Functional Questionnaire:   Activities of Daily Living (ADLs):   He states they are independent in the following: He needs assistance with most activities.  States that he ambulates with a cane and tries to sit in the chair during the day.   States they require assistance with the following: cane,  Medication help.   Any transportation issues/concerns?: lives at assisted living, will need to coordinate with follow up appointment.    Any patient concerns? Concerned that no one has Changed his bandages.  He said it was wet.  I told him when we got off the phone to ask the staff to assist and make sure they were changed.  He was also concerned that his family wasn't coming today to visit him.       Confirmed importance and date/time of follow-up visits scheduled: I explained that a follow up appointment would be made with Dr. Caryl Bis in the next few days and that he would need to make sure he came for it.  I will  have Denisa help make the appointment tomorrow when she returns.    Confirmed with patient if condition begins to worsen call PCP or go to the ER.  Patient was given the Call-a-Nurse line [phone]:

## 2014-09-02 ENCOUNTER — Other Ambulatory Visit: Payer: Self-pay | Admitting: Family Medicine

## 2014-09-02 ENCOUNTER — Telehealth: Payer: Self-pay

## 2014-09-02 DIAGNOSIS — G4700 Insomnia, unspecified: Secondary | ICD-10-CM

## 2014-09-02 MED ORDER — ZOLPIDEM TARTRATE 5 MG PO TABS: 5.0000 mg | ORAL_TABLET | Freq: Every evening | ORAL | Status: DC | PRN

## 2014-09-02 NOTE — Telephone Encounter (Signed)
Spoke with the nurse Mardene Celeste) at [facility] facility [facility] building) called and wanted to check the status update on Ambien refill. Dr Joseph Art can you fill for pt? He opnly takes this medication as needed.   Ph: [phone]

## 2014-09-03 NOTE — Progress Notes (Signed)
History and physical examinations obtained with PA English. Agree with assessment and plan. Waymond Meador Martin Simmie Camerer, M.D. [facility]  [place] [address] [place], [place]  [address] [phone] phone [phone] fax  

## 2014-09-03 NOTE — Telephone Encounter (Signed)
Faxed. Notified Mardene Celeste.

## 2014-09-06 ENCOUNTER — Emergency Department: Payer: Medicare HMO

## 2014-09-06 ENCOUNTER — Telehealth: Payer: Self-pay

## 2014-09-06 ENCOUNTER — Inpatient Hospital Stay
Admission: EM | Admit: 2014-09-06 | Discharge: 2014-09-08 | DRG: 092 | Disposition: A | Discharge status: Home Health | Payer: Medicare HMO | Attending: Internal Medicine | Admitting: Internal Medicine

## 2014-09-06 DIAGNOSIS — Z7902 Long term (current) use of antithrombotics/antiplatelets: Secondary | ICD-10-CM

## 2014-09-06 DIAGNOSIS — T404X5A Adverse effect of other synthetic narcotics, initial encounter: Secondary | ICD-10-CM | POA: Diagnosis present

## 2014-09-06 DIAGNOSIS — Z800 Family history of malignant neoplasm of digestive organs: Secondary | ICD-10-CM

## 2014-09-06 DIAGNOSIS — F419 Anxiety disorder, unspecified: Secondary | ICD-10-CM | POA: Diagnosis present

## 2014-09-06 DIAGNOSIS — Z950 Presence of cardiac pacemaker: Secondary | ICD-10-CM

## 2014-09-06 DIAGNOSIS — R946 Abnormal results of thyroid function studies: Secondary | ICD-10-CM | POA: Diagnosis present

## 2014-09-06 DIAGNOSIS — I4892 Unspecified atrial flutter: Secondary | ICD-10-CM | POA: Diagnosis present

## 2014-09-06 DIAGNOSIS — F329 Major depressive disorder, single episode, unspecified: Secondary | ICD-10-CM | POA: Diagnosis present

## 2014-09-06 DIAGNOSIS — L97329 Non-pressure chronic ulcer of left ankle with unspecified severity: Secondary | ICD-10-CM | POA: Diagnosis present

## 2014-09-06 DIAGNOSIS — Z79899 Other long term (current) drug therapy: Secondary | ICD-10-CM

## 2014-09-06 DIAGNOSIS — Z87891 Personal history of nicotine dependence: Secondary | ICD-10-CM | POA: Diagnosis not present

## 2014-09-06 DIAGNOSIS — I10 Essential (primary) hypertension: Secondary | ICD-10-CM | POA: Diagnosis present

## 2014-09-06 DIAGNOSIS — I2510 Atherosclerotic heart disease of native coronary artery without angina pectoris: Secondary | ICD-10-CM | POA: Diagnosis present

## 2014-09-06 DIAGNOSIS — Z9001 Acquired absence of eye: Secondary | ICD-10-CM | POA: Diagnosis present

## 2014-09-06 DIAGNOSIS — Y92129 Unspecified place in nursing home as the place of occurrence of the external cause: Secondary | ICD-10-CM

## 2014-09-06 DIAGNOSIS — I482 Chronic atrial fibrillation: Secondary | ICD-10-CM | POA: Diagnosis present

## 2014-09-06 DIAGNOSIS — I872 Venous insufficiency (chronic) (peripheral): Secondary | ICD-10-CM | POA: Diagnosis present

## 2014-09-06 DIAGNOSIS — G9340 Encephalopathy, unspecified: Secondary | ICD-10-CM | POA: Diagnosis present

## 2014-09-06 DIAGNOSIS — G92 Toxic encephalopathy: Secondary | ICD-10-CM | POA: Diagnosis present

## 2014-09-06 DIAGNOSIS — G629 Polyneuropathy, unspecified: Secondary | ICD-10-CM | POA: Diagnosis present

## 2014-09-06 DIAGNOSIS — K219 Gastro-esophageal reflux disease without esophagitis: Secondary | ICD-10-CM | POA: Diagnosis present

## 2014-09-06 DIAGNOSIS — H9190 Unspecified hearing loss, unspecified ear: Secondary | ICD-10-CM | POA: Diagnosis present

## 2014-09-06 DIAGNOSIS — G250 Essential tremor: Secondary | ICD-10-CM | POA: Diagnosis present

## 2014-09-06 DIAGNOSIS — E785 Hyperlipidemia, unspecified: Secondary | ICD-10-CM | POA: Diagnosis present

## 2014-09-06 DIAGNOSIS — R451 Restlessness and agitation: Secondary | ICD-10-CM | POA: Diagnosis present

## 2014-09-06 DIAGNOSIS — I5022 Chronic systolic (congestive) heart failure: Secondary | ICD-10-CM | POA: Diagnosis present

## 2014-09-06 DIAGNOSIS — R4182 Altered mental status, unspecified: Secondary | ICD-10-CM

## 2014-09-06 LAB — CBC WITH DIFFERENTIAL/PLATELET
BASOS ABS: 0 10*3/uL (ref 0–0.1)
Basophils Relative: 1 %
Eosinophils Absolute: 0.2 10*3/uL (ref 0–0.7)
Eosinophils Relative: 3 %
HEMATOCRIT: 39.3 % — AB (ref 40.0–52.0)
Hemoglobin: 12.9 g/dL — ABNORMAL LOW (ref 13.0–18.0)
LYMPHS PCT: 12 %
Lymphs Abs: 0.9 10*3/uL — ABNORMAL LOW (ref 1.0–3.6)
MCH: 29.1 pg (ref 26.0–34.0)
MCHC: 32.9 g/dL (ref 32.0–36.0)
MCV: 88.6 fL (ref 80.0–100.0)
MONO ABS: 0.7 10*3/uL (ref 0.2–1.0)
Monocytes Relative: 10 %
NEUTROS ABS: 5.3 10*3/uL (ref 1.4–6.5)
Neutrophils Relative %: 74 %
Platelets: 116 10*3/uL — ABNORMAL LOW (ref 150–440)
RBC: 4.44 MIL/uL (ref 4.40–5.90)
RDW: 16.4 % — AB (ref 11.5–14.5)
WBC: 7.1 10*3/uL (ref 3.8–10.6)

## 2014-09-06 LAB — URINALYSIS COMPLETE WITH MICROSCOPIC (ARMC ONLY)
BACTERIA UA: NONE SEEN
Bilirubin Urine: NEGATIVE
Glucose, UA: NEGATIVE mg/dL
Hgb urine dipstick: NEGATIVE
KETONES UR: NEGATIVE mg/dL
Leukocytes, UA: NEGATIVE
Nitrite: NEGATIVE
PROTEIN: NEGATIVE mg/dL
Specific Gravity, Urine: 1.008 (ref 1.005–1.030)
pH: 5 (ref 5.0–8.0)

## 2014-09-06 LAB — COMPREHENSIVE METABOLIC PANEL
ALK PHOS: 103 U/L (ref 38–126)
ALT: 10 U/L — AB (ref 17–63)
AST: 29 U/L (ref 15–41)
Albumin: 3.2 g/dL — ABNORMAL LOW (ref 3.5–5.0)
Anion gap: 8 (ref 5–15)
BILIRUBIN TOTAL: 3 mg/dL — AB (ref 0.3–1.2)
BUN: 21 mg/dL — AB (ref 6–20)
CALCIUM: 8.4 mg/dL — AB (ref 8.9–10.3)
CO2: 28 mmol/L (ref 22–32)
CREATININE: 1.27 mg/dL — AB (ref 0.61–1.24)
Chloride: 101 mmol/L (ref 101–111)
GFR calc Af Amer: 59 mL/min — ABNORMAL LOW (ref >60)
GFR, EST NON AFRICAN AMERICAN: 51 mL/min — AB (ref >60)
Glucose, Bld: 107 mg/dL — ABNORMAL HIGH (ref 65–99)
POTASSIUM: 3.5 mmol/L (ref 3.5–5.1)
Sodium: 137 mmol/L (ref 135–145)
TOTAL PROTEIN: 7.1 g/dL (ref 6.5–8.1)

## 2014-09-06 LAB — T4, FREE: FREE T4: 1.33 ng/dL — AB (ref 0.61–1.12)

## 2014-09-06 LAB — TROPONIN I: Troponin I: <0.03 ng/mL (ref <0.031)

## 2014-09-06 LAB — TSH: TSH: 7.475 u[IU]/mL — ABNORMAL HIGH (ref 0.350–4.500)

## 2014-09-06 MED ORDER — ONDANSETRON HCL 4 MG PO TABS: 4.0000 mg | ORAL_TABLET | Freq: Four times a day (QID) | ORAL | Status: DC | PRN

## 2014-09-06 MED ORDER — OXYCODONE HCL 5 MG PO TABS: 5.0000 mg | ORAL_TABLET | ORAL | Status: DC | PRN

## 2014-09-06 MED ORDER — IPRATROPIUM-ALBUTEROL 0.5-2.5 (3) MG/3ML IN SOLN: 3.0000 mL | RESPIRATORY_TRACT | Status: DC | PRN

## 2014-09-06 MED ORDER — ONDANSETRON HCL 4 MG/2ML IJ SOLN
4.0000 mg | Freq: Four times a day (QID) | INTRAMUSCULAR | Status: DC | PRN
  Administered 2014-09-06: 4 mg via INTRAVENOUS
  Filled 2014-09-06: qty 2

## 2014-09-06 MED ORDER — ACETAMINOPHEN 650 MG RE SUPP: 650.0000 mg | Freq: Four times a day (QID) | RECTAL | Status: DC | PRN

## 2014-09-06 MED ORDER — HEPARIN SODIUM (PORCINE) 5000 UNIT/ML IJ SOLN: 5000.0000 [IU] | Freq: Three times a day (TID) | INTRAMUSCULAR | Status: DC

## 2014-09-06 MED ORDER — ACETAMINOPHEN 325 MG PO TABS: 650.0000 mg | ORAL_TABLET | Freq: Four times a day (QID) | ORAL | Status: DC | PRN

## 2014-09-06 MED ORDER — FUROSEMIDE 10 MG/ML IJ SOLN
40.0000 mg | Freq: Once | INTRAMUSCULAR | Status: AC
  Administered 2014-09-07: 40 mg via INTRAVENOUS
  Filled 2014-09-06: qty 4

## 2014-09-06 MED ORDER — MORPHINE SULFATE (PF) 2 MG/ML IV SOLN
2.0000 mg | INTRAVENOUS | Status: DC | PRN
  Administered 2014-09-06: 2 mg via INTRAVENOUS
  Filled 2014-09-06: qty 1

## 2014-09-06 NOTE — Telephone Encounter (Signed)
Jarrett Soho from [facility] is calling because the patient is pouring liquid from his leg due to cellulitis. She also states that the patient has been lost and confused. She would like to know if the patient needs to go to the emergency room. Please  Call Jarrett Soho! [phone]

## 2014-09-06 NOTE — Telephone Encounter (Signed)
Spoke with Keith Ellis and advised her to send pt to the emergency room. FYI Dr. Tamala Julian

## 2014-09-06 NOTE — ED Provider Notes (Signed)
[facility] Emergency Department Provider Note  ____________________________________________  Time seen: Seen upon arrival to the emergency department  I have reviewed the triage vital signs and the nursing notes.   HISTORY  Chief Complaint Altered Mental Status    HPI XXAVIER NOON is a 81 y.o. male with a history of CHF and bilateral lower extremity ulcers who is presenting today with altered mental status.Per EMS, the patient has been increasingly confused today. The patient is denying any pain at this time and has no acute complaints. However, the patient was reported to have her desires to leave his assisted living which is not his baseline. Denies any chest pain or shortness of breath. No dysuria. Recent admission for CHF exacerbation with also treatment of lower extremity ulcerations with Cipro because of Escherichia coli as well as pseudomonas colonization. Denies any focal weakness or headache. Denies any numbness.   Past Medical History  Diagnosis Date  . GERD (gastroesophageal reflux disease)   . Hypertension   . Blind     Right enucleation  . Hearing loss   . Coronary artery disease     Minimal LAD stenosis 2007  . BPH (benign prostatic hypertrophy)   . Hyperlipidemia   . Varicose vein   . Peripheral neuropathy   . Tremor, essential   . Aortic sclerosis     2008  . Aortic stenosis     Mild, echo, January, 2014  . Atrial flutter     December, 2013  . Chronic anticoagulation     Apixaban started December 27, 2011  . Edema     December, 2013  . CHF (congestive heart failure)   . Hearing difficulty of both ears     hearing aids  . Urine retention 01/08/2013  . Depression   . Urinary catheter in place   . Syncope 07/06/2013    Patient Active Problem List   Diagnosis Date Noted  . Acute diastolic CHF (congestive heart failure) 08/27/2014  . Venous stasis ulcer 08/22/2014  . Second degree AV block 06/29/2014  . Chronic renal  disease, stage III 03/29/2014  . RBBB 03/29/2014  . CHF (congestive heart failure) 03/26/2014  . BPH (benign prostatic hypertrophy) with urinary retention 03/11/2013  . Aortic stenosis-mild   . Chronic anticoagulation   . Edema   . Blind left eye 02/08/2011  . Hearing impaired 02/08/2011  . Hyperlipidemia 02/08/2011  . GERD (gastroesophageal reflux disease) 02/08/2011  . Peripheral neuropathy 02/08/2011  . Hypertension   . CAD- mild disease 2007     Past Surgical History  Procedure Laterality Date  . Lung surgery      Benign lump removed  . Tumor excision      behindleft eye,abpve left eye,left foot  . Esophagogastroduodenoscopy N/A 06/23/2012    Procedure: ESOPHAGOGASTRODUODENOSCOPY (EGD);  Surgeon: Ladene Artist, MD;  Location: [facility];  Service: Endoscopy;  Laterality: N/A;  . Upper gastrointestinal endoscopy    . Cataract extraction Right   . Eye surgery Left     removal eyeball  . Transurethral resection of prostate N/A 03/11/2013    Procedure: TRANSURETHRAL RESECTION OF THE PROSTATE WITH GYRUS INSTRUMENTS;  Surgeon: Alexis Frock, MD;  Location: [facility];  Service: Urology;  Laterality: N/A;  . Permanent pacemaker insertion N/A 03/30/2014    Procedure: PERMANENT PACEMAKER INSERTION;  Surgeon: [name], MD;  Location: [facility] CATH LAB;  Service: Cardiovascular;  Laterality: N/A;  . Peripheral vascular catheterization N/A 06/23/2014    Procedure: Abdominal Aortogram;  Surgeon: Serafina Mitchell, MD;  Location: [place] CV LAB;  Service: Cardiovascular;  Laterality: N/A;  . Peripheral vascular catheterization N/A 06/23/2014    Procedure: Pelvic Angiography;  Surgeon: Serafina Mitchell, MD;  Location: [place] CV LAB;  Service: Cardiovascular;  Laterality: N/A;    Current Outpatient Rx  Name  Route  Sig  Dispense  Refill  . apixaban (ELIQUIS) 5 MG TABS tablet   Oral   Take 5 mg by mouth 2 (two) times daily.         . ciprofloxacin (CIPRO) 500 MG tablet   Oral   Take  1 tablet (500 mg total) by mouth 2 (two) times daily.   14 tablet   0   . clonazePAM (KLONOPIN) 0.5 MG tablet   Oral   Take 1 tablet (0.5 mg total) by mouth 2 (two) times daily as needed. for anxiety Patient taking differently: Take 0.5 mg by mouth 2 (two) times daily as needed for anxiety.    60 tablet   5   . Coconut Oil 1000 MG CAPS   Oral   Take 1,000 mg by mouth 2 (two) times daily.         Marland Kitchen ELDERBERRY PO   Oral   Take 1 capsule by mouth daily.         Marland Kitchen escitalopram (LEXAPRO) 20 MG tablet   Oral   Take 1 tablet (20 mg total) by mouth daily.   30 tablet   3   . hydrALAZINE (APRESOLINE) 10 MG tablet   Oral   Take 1 tablet (10 mg total) by mouth every 8 (eight) hours.   90 tablet   5   . isosorbide mononitrate (IMDUR) 30 MG 24 hr tablet   Oral   Take 0.5 tablets (15 mg total) by mouth daily.   30 tablet   5   . Multiple Vitamin (MULTIVITAMIN WITH MINERALS) TABS tablet   Oral   Take 1 tablet by mouth daily.         Marland Kitchen omeprazole (PRILOSEC) 40 MG capsule   Oral   Take 1 capsule (40 mg total) by mouth daily.   30 capsule   5   . potassium chloride SA (K-DUR,KLOR-CON) 20 MEQ tablet   Oral   Take 20 mEq by mouth daily.         Marland Kitchen torsemide (DEMADEX) 20 MG tablet      TAKE ONE TABLET BY MOUTH TWICE DAILY Patient taking differently: Take 20 mg by mouth 2 (two) times daily.    60 tablet   3   . traMADol (ULTRAM) 50 MG tablet   Oral   Take 1 tablet (50 mg total) by mouth every 8 (eight) hours as needed. Patient taking differently: Take 50 mg by mouth every 8 (eight) hours as needed for moderate pain.    30 tablet   0   . zolpidem (AMBIEN) 5 MG tablet   Oral   Take 1 tablet (5 mg total) by mouth at bedtime as needed for sleep.   90 tablet   1     Allergies Menthol  Family History  Problem Relation Age of Onset  . Cancer Father     Rectal  . Rectal cancer Father   . Cancer Mother     Breast  . Stomach cancer Neg Hx   . Esophageal  cancer Neg Hx   . Colon cancer Neg Hx   . Cancer Sister     Social  History Social History  Substance Use Topics  . Smoking status: Former Smoker    Types: Cigarettes    Quit date: 07/20/1975  . Smokeless tobacco: Former Systems developer  . Alcohol Use: No    Review of Systems Constitutional: No fever/chills Eyes: No visual changes. ENT: No sore throat. Cardiovascular: Denies chest pain. Respiratory: Denies shortness of breath. Gastrointestinal: No abdominal pain.  No nausea, no vomiting.  No diarrhea.  No constipation. Genitourinary: Negative for dysuria. Musculoskeletal: Negative for back pain. Skin: Negative for rash. Neurological: Negative for headaches, focal weakness or numbness.  10-point ROS otherwise negative.  ____________________________________________   PHYSICAL EXAM:  VITAL SIGNS: ED Triage Vitals  Enc Vitals Group     BP --      Pulse --      Resp --      Temp --      Temp src --      SpO2 --      Weight --      Height --      Head Cir --      Peak Flow --      Pain Score 09/06/14 1644 0     Pain Loc --      Pain Edu? --      Excl. in [place]? --     Constitutional: Alert and oriented to self location and birthdate. Well appearing and in no acute distress. Eyes: Conjunctivae are normal. PRRL on the right. Has an artificial eye on the left. EOMI on the right. Head: Atraumatic. Nose: No congestion/rhinnorhea. Mouth/Throat: Mucous membranes are moist.  Oropharynx non-erythematous. Neck: No stridor.   Cardiovascular: Normal rate, regular rhythm. Grossly normal heart sounds.  Good peripheral circulation. Respiratory: Normal respiratory effort.  No retractions. Lungs CTAB. Gastrointestinal: Soft and nontender. No distention. No abdominal bruits. No CVA tenderness. Musculoskeletal: Bilateral lower extremity edema with swelling and mild tenderness to palpation. No joint effusions. Neurologic:  Normal speech and language. No gross focal neurologic deficits are  appreciated. No gait instability. Skin:  Hyperpigmentation to the bilateral lower extremities with multiple ulcerations. No pus, induration surrounding. Psychiatric: Mood and affect are normal. Speech and behavior are normal.  ____________________________________________   LABS (all labs ordered are listed, but only abnormal results are displayed)  Labs Reviewed  CBC WITH DIFFERENTIAL/PLATELET - Abnormal; Notable for the following:    Hemoglobin 12.9 (*)    HCT 39.3 (*)    RDW 16.4 (*)    Platelets 116 (*)    Lymphs Abs 0.9 (*)    All other components within normal limits  COMPREHENSIVE METABOLIC PANEL - Abnormal; Notable for the following:    Glucose, Bld 107 (*)    BUN 21 (*)    Creatinine, Ser 1.27 (*)    Calcium 8.4 (*)    Albumin 3.2 (*)    ALT 10 (*)    Total Bilirubin 3.0 (*)    GFR calc non Af Amer 51 (*)    GFR calc Af Amer 59 (*)    All other components within normal limits  TSH - Abnormal; Notable for the following:    TSH 7.475 (*)    All other components within normal limits  URINALYSIS COMPLETEWITH MICROSCOPIC ([facility] ONLY) - Abnormal; Notable for the following:    Color, Urine YELLOW (*)    APPearance CLEAR (*)    Squamous Epithelial / LPF 0-5 (*)    All other components within normal limits  TROPONIN I   ____________________________________________  EKG  ED ECG REPORT I, Doran Stabler, the attending physician, personally viewed and interpreted this ECG.   Date: 09/06/2014  EKG Time: 1727  Rate: 61  Rhythm: AV sequential or dual-chamber electronic pacemaker  Axis: Normal axis  Intervals:Wide-complex QRS consistent with AV pacing.  ST&T Change: T wave inversions in 1, aVL as well as V2. V2 inversion is new since 819 but probably related to lead placement.  ____________________________________________  RADIOLOGY  1. Congestion with a right pleural effusion which appears improved from previous. I personally reviewed these images. CAT scan of  the brain without any acute cranial abnormality. ____________________________________________   PROCEDURES   ____________________________________________   INITIAL IMPRESSION / ASSESSMENT AND PLAN / ED COURSE  Pertinent labs & imaging results that were available during my care of the patient were reviewed by me and considered in my medical decision making (see chart for details).  ----------------------------------------- 8:14 PM on 09/06/2014 -----------------------------------------  Patient's son as well as daughter-in-law are now the bedside. They stated that the patient has been having worsening confusion and agitation over the past 2-3 days. They state that he has never acted like this before except when he has had a urinary tract infection in the past. He does not have a psychiatric history or history of dementia with psychosis. I will admit the patient for undifferentiated altered mental status. There is no obvious source of infection to attribute the symptoms to. Signed out to Dr. Verdell Carmine. ____________________________________________   FINAL CLINICAL IMPRESSION(S) / ED DIAGNOSES  Acute altered mental status. Initial visit.    Orbie Pyo, MD 09/06/14 [id number]

## 2014-09-06 NOTE — ED Notes (Signed)
Patient sent here from [facility].Staff states patient is not himself. Insisting on wanting to leave and being combative.

## 2014-09-06 NOTE — H&P (Signed)
[facility] [place] NAME: Keith Ellis    MR#:  [id number]  DATE OF BIRTH:  [birth year]   DATE OF ADMISSION:  09/06/2014  PRIMARY CARE PHYSICIAN: Tommi Rumps, MD   REQUESTING/REFERRING PHYSICIAN: Clearnce Hasten  CHIEF COMPLAINT:   Chief Complaint  Patient presents with  . Altered Mental Status    HISTORY OF PRESENT ILLNESS:  Keith Ellis  is a 81 y.o. male with a known history of essential hypertension, permanent Pacemaker insertion, diastolic congestive heart failure presenting with altered mental status. The patient is unable to provide meaningful information given mental status/medical condition. History obtained from son at bedside. States that the patient has been having increased episodes of confusion as well as "muttering" for the last 2-3 days. However, 4 AM the day of admission at his nursing facility he became acutely agitated and combative and apparently took all of his bedding in place in the hallway. No further symptomatology has been noted other than lower extremity edema edema/ulcerations which have actually improved. Of note recently in the hospital with discharge diagnosis acute chronic diastolic congestive heart failure, lower extremity edema with ulceration placed on Cipro for about a coverage of leg wounds and has completed that course.  PAST MEDICAL HISTORY:   Past Medical History  Diagnosis Date  . GERD (gastroesophageal reflux disease)   . Hypertension   . Blind     Right enucleation  . Hearing loss   . Coronary artery disease     Minimal LAD stenosis 2007  . BPH (benign prostatic hypertrophy)   . Hyperlipidemia   . Varicose vein   . Peripheral neuropathy   . Tremor, essential   . Aortic sclerosis     2008  . Aortic stenosis     Mild, echo, January, 2014  . Atrial flutter     December, 2013  . Chronic anticoagulation     Apixaban started December 27, 2011  . Edema     December, 2013  . CHF  (congestive heart failure)   . Hearing difficulty of both ears     hearing aids  . Urine retention 01/08/2013  . Depression   . Urinary catheter in place   . Syncope 07/06/2013    PAST SURGICAL HISTORY:   Past Surgical History  Procedure Laterality Date  . Lung surgery      Benign lump removed  . Tumor excision      behindleft eye,abpve left eye,left foot  . Esophagogastroduodenoscopy N/A 06/23/2012    Procedure: ESOPHAGOGASTRODUODENOSCOPY (EGD);  Surgeon: Ladene Artist, MD;  Location: [facility];  Service: Endoscopy;  Laterality: N/A;  . Upper gastrointestinal endoscopy    . Cataract extraction Right   . Eye surgery Left     removal eyeball  . Transurethral resection of prostate N/A 03/11/2013    Procedure: TRANSURETHRAL RESECTION OF THE PROSTATE WITH GYRUS INSTRUMENTS;  Surgeon: Alexis Frock, MD;  Location: [facility];  Service: Urology;  Laterality: N/A;  . Permanent pacemaker insertion N/A 03/30/2014    Procedure: PERMANENT PACEMAKER INSERTION;  Surgeon: [name], MD;  Location: [facility] CATH LAB;  Service: Cardiovascular;  Laterality: N/A;  . Peripheral vascular catheterization N/A 06/23/2014    Procedure: Abdominal Aortogram;  Surgeon: Serafina Mitchell, MD;  Location: [place] CV LAB;  Service: Cardiovascular;  Laterality: N/A;  . Peripheral vascular catheterization N/A 06/23/2014    Procedure: Pelvic Angiography;  Surgeon: Serafina Mitchell, MD;  Location: [place] CV LAB;  Service: Cardiovascular;  Laterality: N/A;    SOCIAL HISTORY:   Social History  Substance Use Topics  . Smoking status: Former Smoker    Types: Cigarettes    Quit date: 07/20/1975  . Smokeless tobacco: Former Systems developer  . Alcohol Use: No    FAMILY HISTORY:   Family History  Problem Relation Age of Onset  . Cancer Father     Rectal  . Rectal cancer Father   . Cancer Mother     Breast  . Stomach cancer Neg Hx   . Esophageal cancer Neg Hx   . Colon cancer Neg Hx   . Cancer Sister     DRUG  ALLERGIES:   Allergies  Allergen Reactions  . Menthol Hives and Itching    REVIEW OF SYSTEMS:  Unable to fully obtain given patient's mental status/medical condition   MEDICATIONS AT HOME:   Prior to Admission medications   Medication Sig Start Date End Date Taking? Authorizing Provider  apixaban (ELIQUIS) 5 MG TABS tablet Take 5 mg by mouth 2 (two) times daily.   Yes Historical Provider, MD  clonazePAM (KLONOPIN) 0.5 MG tablet Take 1 tablet (0.5 mg total) by mouth 2 (two) times daily as needed. for anxiety Patient taking differently: Take 0.5 mg by mouth 2 (two) times daily as needed for anxiety.  05/20/14  Yes [name], MD  escitalopram (LEXAPRO) 20 MG tablet Take 1 tablet (20 mg total) by mouth daily. 07/28/14  Yes [name], MD  hydrALAZINE (APRESOLINE) 10 MG tablet Take 1 tablet (10 mg total) by mouth every 8 (eight) hours. 04/01/14  Yes Almyra Deforest, PA  isosorbide mononitrate (IMDUR) 30 MG 24 hr tablet Take 0.5 tablets (15 mg total) by mouth daily. 04/01/14  Yes Almyra Deforest, PA  Multiple Vitamin (MULTIVITAMIN WITH MINERALS) TABS tablet Take 1 tablet by mouth daily.   Yes Historical Provider, MD  omeprazole (PRILOSEC) 40 MG capsule Take 1 capsule (40 mg total) by mouth daily. 07/31/13  Yes Shawnee Knapp, MD  potassium chloride SA (K-DUR,KLOR-CON) 20 MEQ tablet Take 20 mEq by mouth daily.   Yes Historical Provider, MD  torsemide (DEMADEX) 20 MG tablet TAKE ONE TABLET BY MOUTH TWICE DAILY Patient taking differently: Take 20 mg by mouth 2 (two) times daily.  04/01/14  Yes Almyra Deforest, PA  zolpidem (AMBIEN) 5 MG tablet Take 1 tablet (5 mg total) by mouth at bedtime as needed for sleep. 09/02/14  Yes [name], MD  ciprofloxacin (CIPRO) 500 MG tablet Take 1 tablet (500 mg total) by mouth 2 (two) times daily. Patient not taking: Reported on 09/06/2014 08/29/14   Bettey Costa, MD  traMADol (ULTRAM) 50 MG tablet Take 1 tablet (50 mg total) by mouth every 8 (eight) hours as needed. Patient not  taking: Reported on 09/06/2014 08/16/14   Roselee Culver, MD      VITAL SIGNS:  Blood pressure 132/48, pulse 60, temperature 98.5 F (36.9 C), temperature source Oral, resp. rate 22, height 5\' 8"  (1.727 m), weight 188 lb 11.4 oz (85.599 kg), SpO2 93 %.  PHYSICAL EXAMINATION:   VITAL SIGNS: Filed Vitals:   09/06/14 2015  BP:   Pulse: 60  Temp:   Resp: 22   GENERAL:81 y.o.male moderate distress given mental status. Exam, dictated by mental status, blindness, hard of hearing HEAD: Normocephalic, atraumatic.  EYES: Right Pupil, round, reactive to light. Unable to assess extraocular muscles given mental status/medical condition. No scleral icterus.  MOUTH: Moist mucosal membrane. Dentition intact. No  abscess noted.  EAR, NOSE, THROAT: Clear without exudates. No external lesions.  NECK: Supple. No thyromegaly. No nodules. No JVD.  PULMONARY: Clear to ascultation, without wheeze rails or rhonci. No use of accessory muscles, Good respiratory effort. good air entry bilaterally CHEST: Nontender to palpation.  CARDIOVASCULAR: S1 and S2. Regular rate and rhythm. No murmurs, rubs, or gallops. 3+ edema bilaterally to knees. Pedal pulses 2+ bilaterally.  GASTROINTESTINAL: Soft, nontender, nondistended. No masses. Positive bowel sounds. No hepatosplenomegaly.  MUSCULOSKELETAL: No swelling, clubbing, or edema. Range of motion full in all extremities.  NEUROLOGIC: Unable to assess given mental status/medical condition, inability to follow commands SKIN: Bilateral lower extremity ulcerations the feet with non-purulent discharge, No further ulceration, lesions, rashes, or cyanosis. Skin warm and dry. Turgor intact.  PSYCHIATRIC: Unable to fully assess given mental status/medical condition   .    LABORATORY PANEL:   CBC  Recent Labs Lab 09/06/14 1743  WBC 7.1  HGB 12.9*  HCT 39.3*  PLT 116*    ------------------------------------------------------------------------------------------------------------------  Chemistries   Recent Labs Lab 09/06/14 1743  NA 137  K 3.5  CL 101  CO2 28  GLUCOSE 107*  BUN 21*  CREATININE 1.27*  CALCIUM 8.4*  AST 29  ALT 10*  ALKPHOS 103  BILITOT 3.0*   ------------------------------------------------------------------------------------------------------------------  Cardiac Enzymes  Recent Labs Lab 09/06/14 1743  TROPONINI <0.03   ------------------------------------------------------------------------------------------------------------------  RADIOLOGY:  Dg Chest 2 View  09/06/2014   CLINICAL DATA:  Patient is with altered mental status. History of congestive heart failure, coronary artery disease, hypertension.  EXAM: CHEST  2 VIEW  COMPARISON:  08/27/2014  FINDINGS: Dual lead cardiac pacemaker is stable.  Cardiomediastinal silhouette is enlarged. Mediastinal contours appear intact. The aorta is torturous and contains atherosclerotic calcifications. Vascular markings are prominent.  There is a right pleural effusion with associated basilar atelectasis. There is no evidence of pneumothorax.  Osseous structures are without acute abnormality. Soft tissues are grossly normal.  IMPRESSION: Pulmonary vascular congestion.  Increased size of the cardiac silhouette.  Right pleural effusion.   Electronically Signed   By: Fidela Salisbury M.D.   On: 09/06/2014 17:08   Ct Head Wo Contrast  09/06/2014   CLINICAL DATA:  Altered mental status.  EXAM: CT HEAD WITHOUT CONTRAST  TECHNIQUE: Contiguous axial images were obtained from the base of the skull through the vertex without intravenous contrast.  COMPARISON:  None.  FINDINGS: No mass effect or midline shift. No evidence of acute intracranial hemorrhage, or infarction. No abnormal extra-axial fluid collections. Gray-white matter differentiation is normal. Basal cisterns are preserved. There is  atrophy and chronic small vessel disease changes.  No depressed skull fractures. Visualized paranasal sinuses and mastoid air cells are not opacified. There is likely left orbital prosthesis.  IMPRESSION: No acute intracranial abnormality.  Atrophy, chronic microvascular disease.   Electronically Signed   By: Fidela Salisbury M.D.   On: 09/06/2014 17:02    EKG:   Orders placed or performed during the hospital encounter of 09/06/14  . ED EKG  . ED EKG    IMPRESSION AND PLAN:   81 year old Caucasian gentleman history of diastolic congestive heart failure, essential hypertension presenting with altered mental status.  1. Acute encephalopathy: Unclear etiology at this time, avoid further sedating agents, neuro checks every 4 hours, no active evidence pointing towards infection 2. Abnormal thyroid function tests: We'll check a free T4 level, question if this can be related to #1 3. Bilateral lower extremity ulceration: Does not appear to  be infected at this time we will have her consult wound therapy for continued care 4. Diastolic congestive heart failure chronic: Continue home medication for diuresis will give 1 dose of IV Lasix now given some mild pulmonary congestion 5. Venous thromboembolism or phylactic: Therapeutic Eliquis  All the records are reviewed and case discussed with ED provider. Management plans discussed with the patient, family and they are in agreement.  CODE STATUS: Full  TOTAL TIME TAKING CARE OF THIS PATIENT: 35 minutes.    Hower,  Karenann Cai.D on 09/06/2014 at 9:11 PM  Between 7am to 6pm - Pager - [phone]  After 6pm: House Pager: - [phone]  [facility]  Office  [phone]  CC: Primary care physician; Tommi Rumps, MD

## 2014-09-06 NOTE — Telephone Encounter (Signed)
Noted and agree with ED evaluation. Will forward to patient's PCP at [facility].

## 2014-09-07 ENCOUNTER — Telehealth: Payer: Self-pay | Admitting: Family Medicine

## 2014-09-07 ENCOUNTER — Ambulatory Visit: Payer: Medicare HMO | Admitting: Family Medicine

## 2014-09-07 LAB — BASIC METABOLIC PANEL
ANION GAP: 7 (ref 5–15)
BUN: 21 mg/dL — ABNORMAL HIGH (ref 6–20)
CO2: 25 mmol/L (ref 22–32)
Calcium: 8.1 mg/dL — ABNORMAL LOW (ref 8.9–10.3)
Chloride: 106 mmol/L (ref 101–111)
Creatinine, Ser: 1.16 mg/dL (ref 0.61–1.24)
GFR, EST NON AFRICAN AMERICAN: 57 mL/min — AB (ref >60)
Glucose, Bld: 108 mg/dL — ABNORMAL HIGH (ref 65–99)
POTASSIUM: 3.4 mmol/L — AB (ref 3.5–5.1)
SODIUM: 138 mmol/L (ref 135–145)

## 2014-09-07 LAB — CBC
HEMATOCRIT: 37.1 % — AB (ref 40.0–52.0)
HEMOGLOBIN: 12.3 g/dL — AB (ref 13.0–18.0)
MCH: 29 pg (ref 26.0–34.0)
MCHC: 33.1 g/dL (ref 32.0–36.0)
MCV: 87.6 fL (ref 80.0–100.0)
Platelets: 106 10*3/uL — ABNORMAL LOW (ref 150–440)
RBC: 4.24 MIL/uL — ABNORMAL LOW (ref 4.40–5.90)
RDW: 16.7 % — ABNORMAL HIGH (ref 11.5–14.5)
WBC: 5.4 10*3/uL (ref 3.8–10.6)

## 2014-09-07 LAB — MRSA PCR SCREENING: MRSA by PCR: NEGATIVE

## 2014-09-07 MED ORDER — ADULT MULTIVITAMIN W/MINERALS CH
1.0000 | ORAL_TABLET | Freq: Every day | ORAL | Status: DC
  Administered 2014-09-07: 1 via ORAL
  Filled 2014-09-07: qty 1

## 2014-09-07 MED ORDER — ESCITALOPRAM OXALATE 10 MG PO TABS
20.0000 mg | ORAL_TABLET | Freq: Every day | ORAL | Status: DC
  Administered 2014-09-07 – 2014-09-08 (×2): 20 mg via ORAL
  Filled 2014-09-07 (×2): qty 2

## 2014-09-07 MED ORDER — POTASSIUM CHLORIDE CRYS ER 20 MEQ PO TBCR
40.0000 meq | EXTENDED_RELEASE_TABLET | Freq: Once | ORAL | Status: AC
  Administered 2014-09-07: 40 meq via ORAL
  Filled 2014-09-07: qty 2

## 2014-09-07 MED ORDER — HYDRALAZINE HCL 10 MG PO TABS
10.0000 mg | ORAL_TABLET | Freq: Three times a day (TID) | ORAL | Status: DC
  Administered 2014-09-07 – 2014-09-08 (×3): 10 mg via ORAL
  Filled 2014-09-07 (×3): qty 1

## 2014-09-07 MED ORDER — CLONAZEPAM 0.5 MG PO TABS: 0.5000 mg | ORAL_TABLET | Freq: Two times a day (BID) | ORAL | Status: DC | PRN

## 2014-09-07 MED ORDER — TORSEMIDE 20 MG PO TABS
20.0000 mg | ORAL_TABLET | Freq: Two times a day (BID) | ORAL | Status: DC
  Administered 2014-09-07 – 2014-09-08 (×3): 20 mg via ORAL
  Filled 2014-09-07 (×4): qty 1

## 2014-09-07 MED ORDER — PANTOPRAZOLE SODIUM 40 MG PO TBEC
40.0000 mg | DELAYED_RELEASE_TABLET | Freq: Every day | ORAL | Status: DC
  Administered 2014-09-07 – 2014-09-08 (×2): 40 mg via ORAL
  Filled 2014-09-07 (×2): qty 1

## 2014-09-07 MED ORDER — APIXABAN 5 MG PO TABS
5.0000 mg | ORAL_TABLET | Freq: Two times a day (BID) | ORAL | Status: DC
  Administered 2014-09-07 – 2014-09-08 (×3): 5 mg via ORAL
  Filled 2014-09-07 (×3): qty 1

## 2014-09-07 MED ORDER — ISOSORBIDE MONONITRATE ER 30 MG PO TB24
15.0000 mg | ORAL_TABLET | Freq: Every day | ORAL | Status: DC
  Administered 2014-09-07 – 2014-09-08 (×2): 15 mg via ORAL
  Filled 2014-09-07 (×2): qty 1

## 2014-09-07 NOTE — Clinical Social Work Note (Signed)
Clinical Social Work Assessment  Patient Details  Name: Keith Ellis MRN: [id number] Date of Birth: 09/15/1929  Date of referral:  09/07/14               Reason for consult:  Facility Placement, Discharge Planning                Permission sought to share information with:    Permission granted to share information::     Name::        Agency::     Relationship::     Contact Information:     Housing/Transportation Living arrangements for the past 2 months:  [place], [place] ([place]) Source of Information:  Adult Children Patient Interpreter Needed:  None Criminal Activity/Legal Involvement Pertinent to Current Situation/Hospitalization:  No - Comment as needed Significant Relationships:  Adult Children, Spouse Lives with:  Facility Resident Do you feel safe going back to the place where you live?  No Need for family participation in patient care:  Yes (Comment)  Care giving concerns:  Pt has been declining physically over the past month.  Pt is currently confused.   Social Worker assessment / plan:  CSW met with pt's son Keith Ellis and daughter-in-law at bedside.  Pt was resting soundly.  CSW explained the role of CSW.  Pt and wife recently moved to [facility] 2 weeks ago.  Prior to moving to ALF, pt and wife lived in a private home and their son Keith Ellis resided with them.  Pt has 3 sons.  His son Keith Ellis is the [place] and POA.  Pt's family expressed a desire to move pt from [place] to [facility] [address] in [place], [place].  CSW will complete FL2 and send to the facility.  CSW called [facility] and left a message to speak with their admission coordinator.  Employment status:  Retired Nurse, adult PT Recommendations:  Not assessed at this time Information / Referral to community resources:  Other (Comment Required) (Assisted Living)  Patient/Family's Response to care:  Pt's son and daughter-in-law were pleasant and  appreciative of CSW assistance.  Patient/Family's Understanding of and Emotional Response to Diagnosis, Current Treatment, and Prognosis:  Pt's family recognizes that pt will continue to need care outside of the home.  Pt's family expressed that [facility] was not the best fit for their family and requested that CSW send an FL2 to [facility].  Emotional Assessment Appearance:  Appears stated age Attitude/Demeanor/Rapport:  Unable to Assess (Pt was sleeping.) Affect (typically observed):  Unable to Assess Orientation:  Oriented to Self Alcohol / Substance use:  Not Applicable Psych involvement (Current and /or in the community):  No (Comment)  Discharge Needs  Concerns to be addressed:  Discharge Planning Concerns Readmission within the last 30 days:  Yes Current discharge risk:  None Barriers to Discharge:  Continued Medical Work up   [place], [place] D, [place] 09/07/2014, 10:10 AM

## 2014-09-07 NOTE — Telephone Encounter (Signed)
Noted and canceled appointment

## 2014-09-07 NOTE — Telephone Encounter (Signed)
Pt was admitted into the hospital and will not be making the appt today. Just got the call. Have a great day!

## 2014-09-07 NOTE — Progress Notes (Signed)
[facility] at [facility] NAME: Keith Ellis    MR#:  [id number]  DATE OF BIRTH:  [birth year]  SUBJECTIVE:  CHIEF COMPLAINT:   Chief Complaint  Patient presents with  . Altered Mental Status   -Admitted for acute change in mental status. Patient is still very sleepy, arousable. Seems to be oriented 1-2 at this time. -No Family at bedside  REVIEW OF SYSTEMS:  Review of Systems  Unable to perform ROS: mental acuity    DRUG ALLERGIES:   Allergies  Allergen Reactions  . Menthol Hives and Itching    VITALS:  Blood pressure 131/56, pulse 61, temperature 97.4 F (36.3 C), temperature source Oral, resp. rate 22, height 5\' 8"  (1.727 m), weight 83.371 kg (183 lb 12.8 oz), SpO2 91 %.  PHYSICAL EXAMINATION:  Physical Exam  GENERAL:  81 y.o.-year-old patient lying in the bed with no acute distress.  EYES: Right Pupil equal, round, reactive to light and accommodation. Left eye is not reacting or having any movement. No scleral icterus. Extraocular muscles intact.  HEENT: Head atraumatic, normocephalic. Oropharynx and nasopharynx clear.  NECK:  Supple, no jugular venous distention. No thyroid enlargement, no tenderness.  LUNGS: Normal breath sounds bilaterally, no wheezing, rales,rhonchi or crepitation. No use of accessory muscles of respiration.  CARDIOVASCULAR: S1, S2 normal. No rubs, or gallops. Systolic murmur present on exam ABDOMEN: Soft, nontender, nondistended. Bowel sounds present. No organomegaly or mass.  EXTREMITIES: No pedal edema, cyanosis, or clubbing.  NEUROLOGIC: Cranial nerves II through XII are intact. Able to move all 4 extremities. Following some commands. Very sleepy on exam. Unable to do a complete neuro exam. PSYCHIATRIC: The patient is alert and oriented x 3.  SKIN: No obvious rash, lesion, or ulcer.    LABORATORY PANEL:   CBC  Recent Labs Lab 09/07/14 0501  WBC 5.4  HGB 12.3*  HCT 37.1*  PLT 106*    ------------------------------------------------------------------------------------------------------------------  Chemistries   Recent Labs Lab 09/06/14 1743 09/07/14 0501  NA 137 138  K 3.5 3.4*  CL 101 106  CO2 28 25  GLUCOSE 107* 108*  BUN 21* 21*  CREATININE 1.27* 1.16  CALCIUM 8.4* 8.1*  AST 29  --   ALT 10*  --   ALKPHOS 103  --   BILITOT 3.0*  --    ------------------------------------------------------------------------------------------------------------------  Cardiac Enzymes  Recent Labs Lab 09/06/14 1743  TROPONINI <0.03   ------------------------------------------------------------------------------------------------------------------  RADIOLOGY:  Dg Chest 2 View  09/06/2014   CLINICAL DATA:  Patient is with altered mental status. History of congestive heart failure, coronary artery disease, hypertension.  EXAM: CHEST  2 VIEW  COMPARISON:  08/27/2014  FINDINGS: Dual lead cardiac pacemaker is stable.  Cardiomediastinal silhouette is enlarged. Mediastinal contours appear intact. The aorta is torturous and contains atherosclerotic calcifications. Vascular markings are prominent.  There is a right pleural effusion with associated basilar atelectasis. There is no evidence of pneumothorax.  Osseous structures are without acute abnormality. Soft tissues are grossly normal.  IMPRESSION: Pulmonary vascular congestion.  Increased size of the cardiac silhouette.  Right pleural effusion.   Electronically Signed   By: Fidela Salisbury M.D.   On: 09/06/2014 17:08   Ct Head Wo Contrast  09/06/2014   CLINICAL DATA:  Altered mental status.  EXAM: CT HEAD WITHOUT CONTRAST  TECHNIQUE: Contiguous axial images were obtained from the base of the skull through the vertex without intravenous contrast.  COMPARISON:  None.  FINDINGS: No mass effect or  midline shift. No evidence of acute intracranial hemorrhage, or infarction. No abnormal extra-axial fluid collections. Gray-white  matter differentiation is normal. Basal cisterns are preserved. There is atrophy and chronic small vessel disease changes.  No depressed skull fractures. Visualized paranasal sinuses and mastoid air cells are not opacified. There is likely left orbital prosthesis.  IMPRESSION: No acute intracranial abnormality.  Atrophy, chronic microvascular disease.   Electronically Signed   By: Fidela Salisbury M.D.   On: 09/06/2014 17:02    EKG:   Orders placed or performed during the hospital encounter of 09/06/14  . ED EKG  . ED EKG  . EKG 12-Lead  . EKG 12-Lead    ASSESSMENT AND PLAN:   Keith Ellis is a 81 y.o. male with a known history of essential hypertension, permanent Pacemaker insertion, diastolic congestive heart failure presenting with altered mental status.  #1 AMS- unknown cause of encephalopathy. -No fevers noted. No source of infection. -Could be any medication related. Hold his narcotics at this time. -Improved from yesterday. Patient is arousable and oriented 2 today.  #2 Systolic CHF- appears well compensated at this time. Continue home dose of torsemide.  #3 chronic atrial flutter/atrial fibrillation-rate controlled. Not on any rate limiting medications. -On eliquis.  #4 hypertension-on Imdur  #5 depression and anxiety-on Celexa and Klonopin  #6 DVT prophylaxis-on eliquis.  Patient is from [facility] facility. Social worker consult today.  All the records are reviewed and case discussed with Care Management/Social Workerr. Management plans discussed with the patient, family and they are in agreement.  CODE STATUS: Full code  TOTAL TIME TAKING CARE OF THIS PATIENT: 38 minutes.   POSSIBLE D/C IN 2 DAYS, DEPENDING ON CLINICAL CONDITION.   Myla Mauriello M.D on 09/07/2014 at 2:57 PM  Between 7am to 6pm - Pager - [phone]  After 6pm go to [email or web address] - password [email or web address] [facility]  [facility]  Office   [phone]  CC: Primary care physician; Tommi Rumps, MD

## 2014-09-07 NOTE — Clinical Social Work Note (Signed)
CSW received a return call from Karleen Hampshire, Astronomer at [facility] [phone]).  Mr. Brayton El confirmed that pt's family is in the process of signing a contract with their facility.  He requested that an FL2 be sent to the facility.  He stated he will send a RN to assess pt.  CSW will continue to follow and assist with d/c planning needs.  [place], [place]

## 2014-09-07 NOTE — Consult Note (Signed)
WOC wound consult note Reason for Consult: Chronic bilateral lower extremities, wears modified compression at home.  Wound type:Chronic venous insufficiency Pressure Ulcer POA: N/A Measurement:Right pretibial ulcer: 2cm x 2cm x 0.2cm.Left medial malleolus: 5.5cm x 4cm x 0.2cm with moist, red wound bed. Left lateral malleolus: 8cm x 7cm x 0.2cm wound moist, red wound bed. Wound JJK:KXFGH red, moist Drainage (amount, consistency, odor) Moderate serous weeping (dressings are moist through several layers) Periwound:Dry skin and generalized edema to lower extremities Dressing procedure/placement/frequency:Cleanse bilateral lower legs with soap and water.  Calcium alginate to nonintact wound bed on bilateral lower extremities.  Wrap lower legs from base of toes to just below the knee with zinc layer.  Secure with [place].  Change Tuesday and Friday.  [facility] team will continue to follow and remain available to patient, medical and nursing teams.  Domenic Moras RN BSN [place] Pager [phone]

## 2014-09-08 ENCOUNTER — Telehealth: Payer: Self-pay | Admitting: Family Medicine

## 2014-09-08 ENCOUNTER — Encounter (HOSPITAL_BASED_OUTPATIENT_CLINIC_OR_DEPARTMENT_OTHER): Payer: Medicare HMO

## 2014-09-08 LAB — BASIC METABOLIC PANEL
ANION GAP: 8 (ref 5–15)
BUN: 23 mg/dL — ABNORMAL HIGH (ref 6–20)
CALCIUM: 8.3 mg/dL — AB (ref 8.9–10.3)
CO2: 25 mmol/L (ref 22–32)
CREATININE: 1.33 mg/dL — AB (ref 0.61–1.24)
Chloride: 104 mmol/L (ref 101–111)
GFR, EST AFRICAN AMERICAN: 56 mL/min — AB (ref >60)
GFR, EST NON AFRICAN AMERICAN: 48 mL/min — AB (ref >60)
GLUCOSE: 99 mg/dL (ref 65–99)
Potassium: 4.1 mmol/L (ref 3.5–5.1)
Sodium: 137 mmol/L (ref 135–145)

## 2014-09-08 LAB — CBC
HEMATOCRIT: 40.2 % (ref 40.0–52.0)
Hemoglobin: 13.2 g/dL (ref 13.0–18.0)
MCH: 28.8 pg (ref 26.0–34.0)
MCHC: 32.9 g/dL (ref 32.0–36.0)
MCV: 87.7 fL (ref 80.0–100.0)
PLATELETS: 118 10*3/uL — AB (ref 150–440)
RBC: 4.58 MIL/uL (ref 4.40–5.90)
RDW: 16.4 % — AB (ref 11.5–14.5)
WBC: 5.5 10*3/uL (ref 3.8–10.6)

## 2014-09-08 NOTE — Care Management Important Message (Signed)
Important Message  Patient Details  Name: Keith Ellis MRN: [id number] Date of Birth: Jul 03, 1929   Medicare Important Message Given:  Yes-second notification given    Juliann Pulse A [place] 09/08/2014, 1:10 PM

## 2014-09-08 NOTE — Telephone Encounter (Signed)
Noted.  Will follow up.  Thanks!

## 2014-09-08 NOTE — Clinical Social Work Note (Signed)
RN, Mickel Baas from [facility] is currently meeting with pt in order to complete an admission assessment.  CSW will continue to follow and assist with d/c planning needs.  [place], [place]

## 2014-09-08 NOTE — Progress Notes (Signed)
IV was removed, Son was present to take pt back to [facility] facility. Volunteer will take pt downstairs via wheelchair.

## 2014-09-08 NOTE — Care Management (Signed)
Patient open to [facility]. Spoke with Keith Ellis who stated that his co pay was 200 dollars weekly asked if family wanted to continue with this co pay. Spoke with son Keith Ellis who was agreeable to investigating a different provider. Contacted [facility]. No co pay with [facility] with patient insurance. Son Keith Ellis stated that he would like to switch home health agency to  [facility]. Referral placed with Keith [facility] at [facility].

## 2014-09-08 NOTE — Discharge Summary (Signed)
[facility] NAME: Keith Ellis    MR#:  [id number]  DATE OF BIRTH:  1929-11-28  DATE OF ADMISSION:  09/06/2014 ADMITTING PHYSICIAN: Lytle Butte, MD  DATE OF DISCHARGE: No discharge date for patient encounter.  PRIMARY CARE PHYSICIAN: Tommi Rumps, MD    ADMISSION DIAGNOSIS:  Agitation [R45.1] Altered mental status, unspecified altered mental status type [R41.82]  DISCHARGE DIAGNOSIS:  Active Problems:   Acute encephalopathy   Abnormal thyroid function test   SECONDARY DIAGNOSIS:   Past Medical History  Diagnosis Date  . GERD (gastroesophageal reflux disease)   . Hypertension   . Blind     Right enucleation  . Hearing loss   . Coronary artery disease     Minimal LAD stenosis 2007  . BPH (benign prostatic hypertrophy)   . Hyperlipidemia   . Varicose vein   . Peripheral neuropathy   . Tremor, essential   . Aortic sclerosis     2008  . Aortic stenosis     Mild, echo, January, 2014  . Atrial flutter     December, 2013  . Chronic anticoagulation     Apixaban started December 27, 2011  . Edema     December, 2013  . CHF (congestive heart failure)   . Hearing difficulty of both ears     hearing aids  . Urine retention 01/08/2013  . Depression   . Urinary catheter in place   . Syncope 07/06/2013    HOSPITAL COURSE:   [name] is a 81 y.o. male with a known history of essential hypertension, permanent Pacemaker insertion, diastolic congestive heart failure presenting with altered mental status.  #1 AMS- unknown cause of encephalopathy.  -No fevers noted. No source of infection. -Could be medication related. Held his narcotics at this time. - Patient more awake and at baseline now  #2 Systolic CHF- appears well compensated at this time. Continue home dose of torsemide.  #3 chronic atrial flutter/atrial fibrillation-rate controlled. Not on any rate limiting medications. -On eliquis.  #4  hypertension-on Imdur  #5 depression and anxiety-on Celexa and Klonopin  #6 Chronic lower extremity ulcers- recently finished cipro, seen by wound care RN here and dressing done and f/u as outpatient  For discharge back to ALF today TSH and T4 slightly elevated, not on any supplements, recheck in 2 weeks- central origin if both are elevated.  DISCHARGE CONDITIONS:   stable  CONSULTS OBTAINED:  Treatment Team:  Lytle Butte, MD  DRUG ALLERGIES:   Allergies  Allergen Reactions  . Menthol Hives and Itching    DISCHARGE MEDICATIONS:   Current Discharge Medication List    CONTINUE these medications which have NOT CHANGED   Details  apixaban (ELIQUIS) 5 MG TABS tablet Take 5 mg by mouth 2 (two) times daily.    clonazePAM (KLONOPIN) 0.5 MG tablet Take 1 tablet (0.5 mg total) by mouth 2 (two) times daily as needed. for anxiety Qty: 60 tablet, Refills: 5    escitalopram (LEXAPRO) 20 MG tablet Take 1 tablet (20 mg total) by mouth daily. Qty: 30 tablet, Refills: 3   Associated Diagnoses: Panic disorder    hydrALAZINE (APRESOLINE) 10 MG tablet Take 1 tablet (10 mg total) by mouth every 8 (eight) hours. Qty: 90 tablet, Refills: 5    isosorbide mononitrate (IMDUR) 30 MG 24 hr tablet Take 0.5 tablets (15 mg total) by mouth daily. Qty: 30 tablet, Refills: 5    Multiple Vitamin (MULTIVITAMIN  WITH MINERALS) TABS tablet Take 1 tablet by mouth daily.    omeprazole (PRILOSEC) 40 MG capsule Take 1 capsule (40 mg total) by mouth daily. Qty: 30 capsule, Refills: 5    potassium chloride SA (K-DUR,KLOR-CON) 20 MEQ tablet Take 20 mEq by mouth daily.    torsemide (DEMADEX) 20 MG tablet TAKE ONE TABLET BY MOUTH TWICE DAILY Qty: 60 tablet, Refills: 3      STOP taking these medications     zolpidem (AMBIEN) 5 MG tablet      ciprofloxacin (CIPRO) 500 MG tablet      traMADol (ULTRAM) 50 MG tablet          DISCHARGE INSTRUCTIONS:   1. PCP f/u in 1 week 2. Wound care f/u in 3  days  If you experience worsening of your admission symptoms, develop shortness of breath, life threatening emergency, suicidal or homicidal thoughts you must seek medical attention immediately by calling 911 or calling your MD immediately  if symptoms less severe.  You Must read complete instructions/literature along with all the possible adverse reactions/side effects for all the Medicines you take and that have been prescribed to you. Take any new Medicines after you have completely understood and accept all the possible adverse reactions/side effects.   Please note  You were cared for by a hospitalist during your hospital stay. If you have any questions about your discharge medications or the care you received while you were in the hospital after you are discharged, you can call the unit and asked to speak with the hospitalist on call if the hospitalist that took care of you is not available. Once you are discharged, your primary care physician will handle any further medical issues. Please note that NO REFILLS for any discharge medications will be authorized once you are discharged, as it is imperative that you return to your primary care physician (or establish a relationship with a primary care physician if you do not have one) for your aftercare needs so that they can reassess your need for medications and monitor your lab values.    Today   CHIEF COMPLAINT:   Chief Complaint  Patient presents with  . Altered Mental Status    VITAL SIGNS:  Blood pressure 141/49, pulse 63, temperature 97.6 F (36.4 C), temperature source Oral, resp. rate 20, height 5\' 8"  (1.727 m), weight 83.371 kg (183 lb 12.8 oz), SpO2 93 %.  I/O:   Intake/Output Summary (Last 24 hours) at 09/08/14 1242 Last data filed at 09/08/14 1036  Gross per 24 hour  Intake    770 ml  Output   1000 ml  Net   -230 ml    PHYSICAL EXAMINATION:   Physical Exam  GENERAL:  81 y.o.-year-old patient lying in the bed with  no acute distress.  EYES: Pupils equal, round, reactive to light and accommodation. No scleral icterus. Extraocular muscles intact.  HEENT: Head atraumatic, normocephalic. Oropharynx and nasopharynx clear.  NECK:  Supple, no jugular venous distention. No thyroid enlargement, no tenderness.  LUNGS: Normal breath sounds bilaterally, no wheezing, rales,rhonchi or crepitation. No use of accessory muscles of respiration.  CARDIOVASCULAR: S1, S2 normal. No murmurs, rubs, or gallops.  ABDOMEN: Soft, non-tender, non-distended. Bowel sounds present. No organomegaly or mass.  EXTREMITIES: No pedal edema, cyanosis, or clubbing.  NEUROLOGIC: Cranial nerves II through XII are intact. Muscle strength 5/5 in all extremities. Sensation intact. Gait not checked.  PSYCHIATRIC: The patient is alert and oriented x 3.  SKIN: No obvious  rash, lesion, or ulcer.   DATA REVIEW:   CBC  Recent Labs Lab 09/08/14 0432  WBC 5.5  HGB 13.2  HCT 40.2  PLT 118*    Chemistries   Recent Labs Lab 09/06/14 1743  09/08/14 0432  NA 137  < > 137  K 3.5  < > 4.1  CL 101  < > 104  CO2 28  < > 25  GLUCOSE 107*  < > 99  BUN 21*  < > 23*  CREATININE 1.27*  < > 1.33*  CALCIUM 8.4*  < > 8.3*  AST 29  --   --   ALT 10*  --   --   ALKPHOS 103  --   --   BILITOT 3.0*  --   --   < > = values in this interval not displayed.  Cardiac Enzymes  Recent Labs Lab 09/06/14 1743  TROPONINI <0.03    Microbiology Results  Results for orders placed or performed during the hospital encounter of 09/06/14  MRSA PCR Screening     Status: None   Collection Time: 09/07/14  5:36 AM  Result Value Ref Range Status   MRSA by PCR NEGATIVE NEGATIVE Final    Comment:        The GeneXpert MRSA Assay (FDA approved for NASAL specimens only), is one component of a comprehensive MRSA colonization surveillance program. It is not intended to diagnose MRSA infection nor to guide or monitor treatment for MRSA infections.      RADIOLOGY:  Dg Chest 2 View  09/06/2014   CLINICAL DATA:  Patient is with altered mental status. History of congestive heart failure, coronary artery disease, hypertension.  EXAM: CHEST  2 VIEW  COMPARISON:  08/27/2014  FINDINGS: Dual lead cardiac pacemaker is stable.  Cardiomediastinal silhouette is enlarged. Mediastinal contours appear intact. The aorta is torturous and contains atherosclerotic calcifications. Vascular markings are prominent.  There is a right pleural effusion with associated basilar atelectasis. There is no evidence of pneumothorax.  Osseous structures are without acute abnormality. Soft tissues are grossly normal.  IMPRESSION: Pulmonary vascular congestion.  Increased size of the cardiac silhouette.  Right pleural effusion.   Electronically Signed   By: Fidela Salisbury M.D.   On: 09/06/2014 17:08   Ct Head Wo Contrast  09/06/2014   CLINICAL DATA:  Altered mental status.  EXAM: CT HEAD WITHOUT CONTRAST  TECHNIQUE: Contiguous axial images were obtained from the base of the skull through the vertex without intravenous contrast.  COMPARISON:  None.  FINDINGS: No mass effect or midline shift. No evidence of acute intracranial hemorrhage, or infarction. No abnormal extra-axial fluid collections. Gray-white matter differentiation is normal. Basal cisterns are preserved. There is atrophy and chronic small vessel disease changes.  No depressed skull fractures. Visualized paranasal sinuses and mastoid air cells are not opacified. There is likely left orbital prosthesis.  IMPRESSION: No acute intracranial abnormality.  Atrophy, chronic microvascular disease.   Electronically Signed   By: Fidela Salisbury M.D.   On: 09/06/2014 17:02    EKG:   Orders placed or performed during the hospital encounter of 09/06/14  . ED EKG  . ED EKG  . EKG 12-Lead  . EKG 12-Lead      Management plans discussed with the patient, family and they are in agreement.  CODE STATUS: Discussed with son  prior to discharge, patient is a Full Code    Code Status Orders        Start  Ordered   09/06/14 2037  Full code   Continuous     09/06/14 2037    Advance Directive Documentation        Most Recent Value   Type of Advance Directive  Living will   Pre-existing out of facility DNR order (yellow form or pink MOST form)     "MOST" Form in Place?        TOTAL TIME TAKING CARE OF THIS PATIENT: 38 minutes.    Gladstone Lighter M.D on 09/08/2014 at 12:42 PM  Between 7am to 6pm - Pager - [phone]  After 6pm go to [email or web address] - password [email or web address] [facility]  [facility]  Office  [phone]  CC: Primary care physician; Tommi Rumps, MD

## 2014-09-08 NOTE — Telephone Encounter (Signed)
Pt is being discharged from Hospital today. I sch appt follow up appt for 09/07. Thank you!

## 2014-09-09 NOTE — Telephone Encounter (Signed)
Transition Care Management Follow-up Telephone Call   Date discharged? 09/08/14   How have you been since you were released from the hospital? Fine. Doing well. Sitting up at the table and eating well.    Do you understand why you were in the hospital? Yes   Do you understand the discharge instructions? Yes and have plenty of help   Where were you discharged to? Home Facility   Items Reviewed:  Medications reviewed: Yes  Allergies reviewed: Yes  Dietary changes reviewed: Yes  Referrals reviewed: Yes   Functional Questionnaire:   Activities of Daily Living (ADLs):   He states they are independent in the following: Toileting, self feeding States they require assistance with the following: Ambulating (uses cane), grooming, dressing, bathing, meal prep (wife and caregiver helps)   Any transportation issues/concerns?: No, [place] to transport   Any patient concerns? Not at this time.   Confirmed importance and date/time of follow-up visits scheduled Yes, appointment made on 09/15/14  Provider Appointment booked with Dr. Caryl Bis (PCP).  Confirmed with patient if condition begins to worsen call PCP or go to the ER.  Patient was given the office number and encouraged to call back with question or concerns.  : Yes and son [name]) verbalized understanding.

## 2014-09-15 ENCOUNTER — Telehealth: Payer: Self-pay | Admitting: *Deleted

## 2014-09-15 ENCOUNTER — Ambulatory Visit: Payer: Medicare HMO | Admitting: Family Medicine

## 2014-09-15 NOTE — Telephone Encounter (Signed)
Keith Ellis called requesting wound change orders be changed from Tuesday and Friday to twice weekly.  spoke with Dr Caryl Bis he said yes however at least 24 hours must be between each dressing change.  Left detailed message on Keith Ellis's VM.

## 2014-09-24 ENCOUNTER — Telehealth: Payer: Self-pay

## 2014-09-24 NOTE — Telephone Encounter (Signed)
Received a message from the wound center regarding this patient. Caller states that this patient is having bilateral pain in lower extremities and has pain scale of 9/10. Caller was wondering if there was a pain Rx that could be prescribed, or if there could be a verbal order for him to have Tylenol until he see's the Dr. Osvaldo Human asks that verbal order is given to [place] at [facility] [phone]). Thanks!

## 2014-09-24 NOTE — Telephone Encounter (Signed)
Spoke with Keith Ellis at Urgent [facility]. I notified her that if pain is persistent then pt needs to be seen at Urgent Care over the weekend, per Dr. Caryl Bis.  Orders faxed over for Tylenol 500mg  PO every 6 hours PRN. Thanks!

## 2014-09-24 NOTE — Telephone Encounter (Signed)
[facility] faxed over a FL2 for Dr. Joseph Art to fill out. Per Dr. Carlean Jews, have pt come in for exam before we can fill out. Faxed back

## 2014-09-24 NOTE — Telephone Encounter (Signed)
Patient can take tylenol 500 mg by mouth every 6 hours as needed for pain. He should be advised to follow-up in the office for this issue and if they can not follow-up in the office should be advised to be evaluated at an urgent care for this issue. Thanks.

## 2014-09-27 ENCOUNTER — Telehealth: Payer: Self-pay | Admitting: Cardiology

## 2014-09-27 ENCOUNTER — Ambulatory Visit (INDEPENDENT_AMBULATORY_CARE_PROVIDER_SITE_OTHER): Payer: Medicare HMO | Admitting: *Deleted

## 2014-09-27 DIAGNOSIS — I441 Atrioventricular block, second degree: Secondary | ICD-10-CM | POA: Diagnosis not present

## 2014-09-27 NOTE — Telephone Encounter (Signed)
LMOVM reminding pt to send remote transmission.   

## 2014-09-29 NOTE — Progress Notes (Signed)
Remote pacemaker transmission.   

## 2014-10-04 LAB — CUP PACEART REMOTE DEVICE CHECK
Battery Remaining Longevity: 127 mo
Brady Statistic AP VP Percent: 94 %
Brady Statistic AP VS Percent: 0 %
Brady Statistic AS VP Percent: 5 %
Brady Statistic AS VS Percent: 0 %
Date Time Interrogation Session: 20160919184926
Lead Channel Impedance Value: 448 Ohm
Lead Channel Setting Pacing Amplitude: 2 V
Lead Channel Setting Pacing Amplitude: 2.5 V
Lead Channel Setting Pacing Pulse Width: 0.4 ms
MDC IDC MSMT BATTERY IMPEDANCE: 100 Ohm
MDC IDC MSMT BATTERY VOLTAGE: 2.78 V
MDC IDC MSMT LEADCHNL RV IMPEDANCE VALUE: 515 Ohm
MDC IDC SET LEADCHNL RV SENSING SENSITIVITY: 2 mV

## 2014-10-05 ENCOUNTER — Emergency Department (HOSPITAL_COMMUNITY): Payer: Medicare HMO

## 2014-10-05 ENCOUNTER — Encounter (HOSPITAL_COMMUNITY): Payer: Self-pay | Admitting: *Deleted

## 2014-10-05 ENCOUNTER — Inpatient Hospital Stay (HOSPITAL_COMMUNITY)
Admission: EM | Admit: 2014-10-05 | Discharge: 2014-10-07 | DRG: 292 | Disposition: A | Discharge status: Hospice | Payer: Medicare HMO | Attending: Internal Medicine | Admitting: Internal Medicine

## 2014-10-05 DIAGNOSIS — Z9001 Acquired absence of eye: Secondary | ICD-10-CM | POA: Diagnosis present

## 2014-10-05 DIAGNOSIS — E785 Hyperlipidemia, unspecified: Secondary | ICD-10-CM | POA: Diagnosis present

## 2014-10-05 DIAGNOSIS — Z87891 Personal history of nicotine dependence: Secondary | ICD-10-CM

## 2014-10-05 DIAGNOSIS — H9190 Unspecified hearing loss, unspecified ear: Secondary | ICD-10-CM | POA: Diagnosis present

## 2014-10-05 DIAGNOSIS — G250 Essential tremor: Secondary | ICD-10-CM | POA: Diagnosis present

## 2014-10-05 DIAGNOSIS — L8990 Pressure ulcer of unspecified site, unspecified stage: Secondary | ICD-10-CM | POA: Diagnosis present

## 2014-10-05 DIAGNOSIS — F410 Panic disorder [episodic paroxysmal anxiety] without agoraphobia: Secondary | ICD-10-CM | POA: Diagnosis present

## 2014-10-05 DIAGNOSIS — R600 Localized edema: Secondary | ICD-10-CM

## 2014-10-05 DIAGNOSIS — R229 Localized swelling, mass and lump, unspecified: Secondary | ICD-10-CM | POA: Diagnosis not present

## 2014-10-05 DIAGNOSIS — Z800 Family history of malignant neoplasm of digestive organs: Secondary | ICD-10-CM

## 2014-10-05 DIAGNOSIS — I83025 Varicose veins of left lower extremity with ulcer other part of foot: Secondary | ICD-10-CM

## 2014-10-05 DIAGNOSIS — I4892 Unspecified atrial flutter: Secondary | ICD-10-CM | POA: Diagnosis present

## 2014-10-05 DIAGNOSIS — I10 Essential (primary) hypertension: Secondary | ICD-10-CM | POA: Diagnosis present

## 2014-10-05 DIAGNOSIS — G629 Polyneuropathy, unspecified: Secondary | ICD-10-CM | POA: Diagnosis present

## 2014-10-05 DIAGNOSIS — Z7189 Other specified counseling: Secondary | ICD-10-CM | POA: Diagnosis not present

## 2014-10-05 DIAGNOSIS — I482 Chronic atrial fibrillation: Secondary | ICD-10-CM | POA: Diagnosis present

## 2014-10-05 DIAGNOSIS — R627 Adult failure to thrive: Secondary | ICD-10-CM | POA: Diagnosis present

## 2014-10-05 DIAGNOSIS — L97929 Non-pressure chronic ulcer of unspecified part of left lower leg with unspecified severity: Secondary | ICD-10-CM | POA: Diagnosis present

## 2014-10-05 DIAGNOSIS — Z7901 Long term (current) use of anticoagulants: Secondary | ICD-10-CM | POA: Diagnosis not present

## 2014-10-05 DIAGNOSIS — Z950 Presence of cardiac pacemaker: Secondary | ICD-10-CM | POA: Diagnosis not present

## 2014-10-05 DIAGNOSIS — I83028 Varicose veins of left lower extremity with ulcer other part of lower leg: Secondary | ICD-10-CM

## 2014-10-05 DIAGNOSIS — Z66 Do not resuscitate: Secondary | ICD-10-CM | POA: Diagnosis present

## 2014-10-05 DIAGNOSIS — K219 Gastro-esophageal reflux disease without esophagitis: Secondary | ICD-10-CM | POA: Diagnosis present

## 2014-10-05 DIAGNOSIS — R531 Weakness: Secondary | ICD-10-CM

## 2014-10-05 DIAGNOSIS — I83021 Varicose veins of left lower extremity with ulcer of thigh: Secondary | ICD-10-CM

## 2014-10-05 DIAGNOSIS — I071 Rheumatic tricuspid insufficiency: Secondary | ICD-10-CM | POA: Diagnosis present

## 2014-10-05 DIAGNOSIS — F329 Major depressive disorder, single episode, unspecified: Secondary | ICD-10-CM | POA: Diagnosis present

## 2014-10-05 DIAGNOSIS — I2510 Atherosclerotic heart disease of native coronary artery without angina pectoris: Secondary | ICD-10-CM | POA: Diagnosis present

## 2014-10-05 DIAGNOSIS — I83009 Varicose veins of unspecified lower extremity with ulcer of unspecified site: Secondary | ICD-10-CM | POA: Diagnosis present

## 2014-10-05 DIAGNOSIS — I83023 Varicose veins of left lower extremity with ulcer of ankle: Secondary | ICD-10-CM

## 2014-10-05 DIAGNOSIS — I723 Aneurysm of iliac artery: Secondary | ICD-10-CM | POA: Diagnosis present

## 2014-10-05 DIAGNOSIS — I878 Other specified disorders of veins: Secondary | ICD-10-CM | POA: Diagnosis present

## 2014-10-05 DIAGNOSIS — I83024 Varicose veins of left lower extremity with ulcer of heel and midfoot: Secondary | ICD-10-CM

## 2014-10-05 DIAGNOSIS — I83029 Varicose veins of left lower extremity with ulcer of unspecified site: Secondary | ICD-10-CM

## 2014-10-05 DIAGNOSIS — Z515 Encounter for palliative care: Secondary | ICD-10-CM

## 2014-10-05 DIAGNOSIS — L97919 Non-pressure chronic ulcer of unspecified part of right lower leg with unspecified severity: Secondary | ICD-10-CM | POA: Diagnosis present

## 2014-10-05 DIAGNOSIS — N400 Enlarged prostate without lower urinary tract symptoms: Secondary | ICD-10-CM | POA: Diagnosis present

## 2014-10-05 DIAGNOSIS — I83022 Varicose veins of left lower extremity with ulcer of calf: Secondary | ICD-10-CM

## 2014-10-05 DIAGNOSIS — L97909 Non-pressure chronic ulcer of unspecified part of unspecified lower leg with unspecified severity: Secondary | ICD-10-CM

## 2014-10-05 DIAGNOSIS — I5033 Acute on chronic diastolic (congestive) heart failure: Secondary | ICD-10-CM | POA: Diagnosis not present

## 2014-10-05 DIAGNOSIS — Z79899 Other long term (current) drug therapy: Secondary | ICD-10-CM

## 2014-10-05 DIAGNOSIS — I509 Heart failure, unspecified: Secondary | ICD-10-CM

## 2014-10-05 LAB — CBC WITH DIFFERENTIAL/PLATELET
BASOS ABS: 0 10*3/uL (ref 0.0–0.1)
Basophils Relative: 0 %
EOS PCT: 1 %
Eosinophils Absolute: 0.1 10*3/uL (ref 0.0–0.7)
HEMATOCRIT: 38.2 % — AB (ref 39.0–52.0)
Hemoglobin: 12.5 g/dL — ABNORMAL LOW (ref 13.0–17.0)
LYMPHS ABS: 1.2 10*3/uL (ref 0.7–4.0)
LYMPHS PCT: 17 %
MCH: 29 pg (ref 26.0–34.0)
MCHC: 32.7 g/dL (ref 30.0–36.0)
MCV: 88.6 fL (ref 78.0–100.0)
MONO ABS: 0.6 10*3/uL (ref 0.1–1.0)
Monocytes Relative: 9 %
NEUTROS ABS: 5.2 10*3/uL (ref 1.7–7.7)
Neutrophils Relative %: 73 %
PLATELETS: 136 10*3/uL — AB (ref 150–400)
RBC: 4.31 MIL/uL (ref 4.22–5.81)
RDW: 18.2 % — AB (ref 11.5–15.5)
WBC: 7.2 10*3/uL (ref 4.0–10.5)

## 2014-10-05 LAB — COMPREHENSIVE METABOLIC PANEL
ALBUMIN: 2.8 g/dL — AB (ref 3.5–5.0)
ALT: 10 U/L — AB (ref 17–63)
AST: 22 U/L (ref 15–41)
Alkaline Phosphatase: 83 U/L (ref 38–126)
Anion gap: 6 (ref 5–15)
BUN: 23 mg/dL — AB (ref 6–20)
CHLORIDE: 104 mmol/L (ref 101–111)
CO2: 29 mmol/L (ref 22–32)
CREATININE: 1.04 mg/dL (ref 0.61–1.24)
Calcium: 8.3 mg/dL — ABNORMAL LOW (ref 8.9–10.3)
GFR calc Af Amer: >60 mL/min (ref >60)
GFR calc non Af Amer: >60 mL/min (ref >60)
GLUCOSE: 92 mg/dL (ref 65–99)
POTASSIUM: 4.1 mmol/L (ref 3.5–5.1)
SODIUM: 139 mmol/L (ref 135–145)
Total Bilirubin: 3.1 mg/dL — ABNORMAL HIGH (ref 0.3–1.2)
Total Protein: 6.7 g/dL (ref 6.5–8.1)

## 2014-10-05 LAB — I-STAT TROPONIN, ED: Troponin i, poc: 0.01 ng/mL (ref 0.00–0.08)

## 2014-10-05 LAB — BRAIN NATRIURETIC PEPTIDE: B NATRIURETIC PEPTIDE 5: 1331.7 pg/mL — AB (ref 0.0–100.0)

## 2014-10-05 MED ORDER — SODIUM CHLORIDE 0.9 % IV SOLN: 250.0000 mL | INTRAVENOUS | Status: DC | PRN

## 2014-10-05 MED ORDER — LISINOPRIL 2.5 MG PO TABS
2.5000 mg | ORAL_TABLET | Freq: Every day | ORAL | Status: DC
  Administered 2014-10-05: 2.5 mg via ORAL
  Filled 2014-10-05 (×2): qty 1

## 2014-10-05 MED ORDER — ACETAMINOPHEN 325 MG PO TABS: 650.0000 mg | ORAL_TABLET | ORAL | Status: DC | PRN

## 2014-10-05 MED ORDER — ESCITALOPRAM OXALATE 20 MG PO TABS
20.0000 mg | ORAL_TABLET | Freq: Every day | ORAL | Status: DC
  Administered 2014-10-05 – 2014-10-07 (×3): 20 mg via ORAL
  Filled 2014-10-05 (×3): qty 1

## 2014-10-05 MED ORDER — APIXABAN 2.5 MG PO TABS
5.0000 mg | ORAL_TABLET | Freq: Two times a day (BID) | ORAL | Status: DC
  Administered 2014-10-05 – 2014-10-07 (×4): 5 mg via ORAL
  Filled 2014-10-05 (×4): qty 2

## 2014-10-05 MED ORDER — CLONAZEPAM 0.5 MG PO TABS
0.5000 mg | ORAL_TABLET | Freq: Two times a day (BID) | ORAL | Status: DC | PRN
  Administered 2014-10-06 – 2014-10-07 (×3): 0.5 mg via ORAL
  Filled 2014-10-05 (×3): qty 1

## 2014-10-05 MED ORDER — SODIUM CHLORIDE 0.9 % IJ SOLN
3.0000 mL | Freq: Two times a day (BID) | INTRAMUSCULAR | Status: DC
  Administered 2014-10-05 – 2014-10-06 (×3): 3 mL via INTRAVENOUS

## 2014-10-05 MED ORDER — PANTOPRAZOLE SODIUM 40 MG PO TBEC
80.0000 mg | DELAYED_RELEASE_TABLET | Freq: Every day | ORAL | Status: DC
  Administered 2014-10-06 – 2014-10-07 (×2): 80 mg via ORAL
  Filled 2014-10-05 (×2): qty 2

## 2014-10-05 MED ORDER — CARVEDILOL 6.25 MG PO TABS
6.2500 mg | ORAL_TABLET | Freq: Two times a day (BID) | ORAL | Status: DC
  Administered 2014-10-06: 6.25 mg via ORAL
  Filled 2014-10-05: qty 1

## 2014-10-05 MED ORDER — HYDRALAZINE HCL 10 MG PO TABS
10.0000 mg | ORAL_TABLET | Freq: Three times a day (TID) | ORAL | Status: DC
  Administered 2014-10-05: 10 mg via ORAL
  Filled 2014-10-05 (×2): qty 1

## 2014-10-05 MED ORDER — FUROSEMIDE 10 MG/ML IJ SOLN
40.0000 mg | Freq: Once | INTRAMUSCULAR | Status: AC
  Administered 2014-10-05: 40 mg via INTRAVENOUS
  Filled 2014-10-05: qty 4

## 2014-10-05 MED ORDER — ISOSORBIDE MONONITRATE ER 30 MG PO TB24
15.0000 mg | ORAL_TABLET | Freq: Every day | ORAL | Status: DC
  Filled 2014-10-05: qty 1

## 2014-10-05 MED ORDER — ONDANSETRON HCL 4 MG/2ML IJ SOLN: 4.0000 mg | Freq: Four times a day (QID) | INTRAMUSCULAR | Status: DC | PRN

## 2014-10-05 MED ORDER — POTASSIUM CHLORIDE CRYS ER 20 MEQ PO TBCR
20.0000 meq | EXTENDED_RELEASE_TABLET | Freq: Every day | ORAL | Status: DC
  Administered 2014-10-06 – 2014-10-07 (×2): 20 meq via ORAL
  Filled 2014-10-05 (×2): qty 1

## 2014-10-05 MED ORDER — SODIUM CHLORIDE 0.9 % IJ SOLN
3.0000 mL | INTRAMUSCULAR | Status: DC | PRN
Start: 2014-10-05 — End: 2014-10-07

## 2014-10-05 MED ORDER — OXYCODONE HCL 5 MG PO TABS
5.0000 mg | ORAL_TABLET | Freq: Four times a day (QID) | ORAL | Status: DC | PRN
  Administered 2014-10-05: 5 mg via ORAL
  Filled 2014-10-05: qty 1

## 2014-10-05 MED ORDER — FUROSEMIDE 10 MG/ML IJ SOLN
20.0000 mg | Freq: Two times a day (BID) | INTRAMUSCULAR | Status: DC
  Administered 2014-10-05 – 2014-10-06 (×2): 20 mg via INTRAVENOUS
  Filled 2014-10-05 (×2): qty 2

## 2014-10-05 MED ORDER — ACETAMINOPHEN 325 MG PO TABS
650.0000 mg | ORAL_TABLET | Freq: Once | ORAL | Status: AC
  Administered 2014-10-05: 650 mg via ORAL
  Filled 2014-10-05: qty 2

## 2014-10-05 MED ORDER — IOHEXOL 300 MG/ML  SOLN
100.0000 mL | Freq: Once | INTRAMUSCULAR | Status: AC | PRN
  Administered 2014-10-05: 100 mL via INTRAVENOUS

## 2014-10-05 NOTE — ED Notes (Signed)
Per [facility], pt is a resident @ [facility].  Staff stated his right eye has been wollen the past couple of mornings, has drainage to right eye.  Has a prosthetic left eye.  Pt also c/o right eye itching.  Staff also stated the left side of his body has swelling but he has swelling on both arms legs.  Pt has hx of A-fib, has a pacer.

## 2014-10-05 NOTE — ED Notes (Signed)
Bed: WA08 Expected date:  Expected time:  Means of arrival:  Comments: EMS 

## 2014-10-05 NOTE — Clinical Social Work Note (Signed)
Clinical Social Work Assessment  Patient Details  Name: Keith Ellis MRN: [id number] Date of Birth: 12/29/1929  Date of referral:  10/05/14               Reason for consult:   (Patient is from [place].)                Permission sought to share information with:   (None.) Permission granted to share information::  No  Name::        Agency::     Relationship::     Contact Information:     Housing/Transportation Living arrangements for the past 2 months:   (Son states that patienit has been at facility for 1 month.) Source of Information:  Patient, Adult Children (Patient was extrmely hard of hearing. Most information was provided by son.) Patient Interpreter Needed:  None Criminal Activity/Legal Involvement Pertinent to Current Situation/Hospitalization:  No - Comment as needed Significant Relationships:  Adult Children Lives with:  Facility Resident Do you feel safe going back to the place where you live?  Yes Need for family participation in patient care:  Yes (Comment) (Son informed CSW that him and brother are both POA for patient.)  Care giving concerns:  There are no care giving concerns at this time. The patient is from [facility] assisted [facility] facility.    Social Worker assessment / plan:  CSW met with patient at bedside. Patient is hard of hearing. Son was present. Son confirms that the patient presents to [facility] due to facial swelling. Also, he confirms that patient is from [place] and has been living there for 1 month.  Patient denies falling often. Son states that the patient has fallen x2 within the past 6 months. Son states that he and his brother are primary support for patient and are both POA.  Son informed CSW that the patient's health has been declining within the pat 3-4 months.   Son/ Jvon Meroney [phone]  Employment status:  Retired Insurance underwriter information:   Doctor, general practice.) PT Recommendations:  Not assessed at this time Information / Referral  to community resources:   (Patient does not need community resources at this time. The patient is from facility.)  Patient/Family's Response to care:  Patient and son are accepting at this time. Son and patient are aware that he will be admitted.  Patient/Family's Understanding of and Emotional Response to Diagnosis, Current Treatment, and Prognosis:  Patient and son are understanding. They state that they do not have any questions for CSW.  Emotional Assessment Appearance:  Appears stated age Attitude/Demeanor/Rapport:   (Appropriate.) Affect (typically observed):  Accepting, Appropriate Orientation:  Oriented to Self, Oriented to Place, Oriented to  Time, Oriented to Situation Alcohol / Substance use:  Not Applicable Psych involvement (Current and /or in the community):  No (Comment)  Discharge Needs  Concerns to be addressed:  Adjustment to Illness Readmission within the last 30 days:  Yes Current discharge risk:  None Barriers to Discharge:  No Barriers Identified   Bernita Buffy, LCSW 10/05/2014, 9:18 PM

## 2014-10-05 NOTE — ED Notes (Signed)
Pt can go at 16:45 

## 2014-10-05 NOTE — ED Provider Notes (Signed)
CSN: [id number]     Arrival date & time 10/05/14  1101 History   First MD Initiated Contact with Patient 10/05/14 1203     Chief Complaint  Patient presents with  . Facial Swelling    right eye     (Consider location/radiation/quality/duration/timing/severity/associated sxs/prior Treatment) HPI  81 year old male presents from his facility with right eye and hand swelling that has been present for the past 2 days. Chronic lower extremity swelling from CHF that is not worse than typical. However hand swelling right greater than left is new over the last couple days. No injuries. Swelling around his right eye is new over the last 2 days as well. Has shortness of breath chronically from CHF but does not feel worse than typical. It somewhat hurts with motion. No redness or fevers. No swelling in his neck or the rest of his face.  Past Medical History  Diagnosis Date  . GERD (gastroesophageal reflux disease)   . Hypertension   . Blind     Right enucleation  . Hearing loss   . Coronary artery disease     Minimal LAD stenosis 2007  . BPH (benign prostatic hypertrophy)   . Hyperlipidemia   . Varicose vein   . Peripheral neuropathy   . Tremor, essential   . Aortic sclerosis     2008  . Aortic stenosis     Mild, echo, January, 2014  . Atrial flutter     December, 2013  . Chronic anticoagulation     Apixaban started December 27, 2011  . Edema     December, 2013  . CHF (congestive heart failure)   . Hearing difficulty of both ears     hearing aids  . Urine retention 01/08/2013  . Depression   . Urinary catheter in place   . Syncope 07/06/2013   Past Surgical History  Procedure Laterality Date  . Lung surgery      Benign lump removed  . Tumor excision      behindleft eye,abpve left eye,left foot  . Esophagogastroduodenoscopy N/A 06/23/2012    Procedure: ESOPHAGOGASTRODUODENOSCOPY (EGD);  Surgeon: Ladene Artist, MD;  Location: [facility];  Service: Endoscopy;  Laterality: N/A;   . Upper gastrointestinal endoscopy    . Cataract extraction Right   . Eye surgery Left     removal eyeball  . Transurethral resection of prostate N/A 03/11/2013    Procedure: TRANSURETHRAL RESECTION OF THE PROSTATE WITH GYRUS INSTRUMENTS;  Surgeon: Alexis Frock, MD;  Location: [facility];  Service: Urology;  Laterality: N/A;  . Permanent pacemaker insertion N/A 03/30/2014    Procedure: PERMANENT PACEMAKER INSERTION;  Surgeon: [name], MD;  [facility];  Service: Cardiovascular;  Laterality: N/A;  . Peripheral vascular catheterization N/A 06/23/2014    Procedure: Abdominal Aortogram;  Surgeon: Serafina Mitchell, MD;  Location: [facility];  Service: Cardiovascular;  Laterality: N/A;  . Peripheral vascular catheterization N/A 06/23/2014    Procedure: Pelvic Angiography;  Surgeon: Serafina Mitchell, MD;  Location: [facility];  Service: Cardiovascular;  Laterality: N/A;   Family History  Problem Relation Age of Onset  . Cancer Father     Rectal  . Rectal cancer Father   . Cancer Mother     Breast  . Stomach cancer Neg Hx   . Esophageal cancer Neg Hx   . Colon cancer Neg Hx   . Cancer Sister    Social History  Substance Use Topics  . Smoking status:  Former Smoker    Types: Cigarettes    Quit date: 07/20/1975  . Smokeless tobacco: Former Systems developer  . Alcohol Use: No    Review of Systems  HENT: Positive for ear pain and facial swelling.   Respiratory: Positive for shortness of breath.   Cardiovascular: Positive for leg swelling. Negative for chest pain.  All other systems reviewed and are negative.     Allergies  Menthol  Home Medications   Prior to Admission medications   Medication Sig Start Date End Date Taking? Authorizing Provider  acetaminophen (TYLENOL) 500 MG tablet Take 500 mg by mouth every 6 (six) hours as needed for moderate pain.   Yes Historical Provider, MD  apixaban (ELIQUIS) 5 MG TABS tablet Take 5 mg by mouth 2 (two) times daily.   Yes  Historical Provider, MD  clonazePAM (KLONOPIN) 0.5 MG tablet Take 1 tablet (0.5 mg total) by mouth 2 (two) times daily as needed. for anxiety Patient taking differently: Take 0.5 mg by mouth 2 (two) times daily as needed for anxiety.  05/20/14  Yes [name], MD  escitalopram (LEXAPRO) 20 MG tablet Take 1 tablet (20 mg total) by mouth daily. 07/28/14  Yes [name], MD  hydrALAZINE (APRESOLINE) 10 MG tablet Take 1 tablet (10 mg total) by mouth every 8 (eight) hours. 04/01/14  Yes Almyra Deforest, PA  isosorbide mononitrate (IMDUR) 30 MG 24 hr tablet Take 0.5 tablets (15 mg total) by mouth daily. 04/01/14  Yes Almyra Deforest, PA  Multiple Vitamin (MULTIVITAMIN WITH MINERALS) TABS tablet Take 1 tablet by mouth daily.   Yes Historical Provider, MD  omeprazole (PRILOSEC) 40 MG capsule Take 1 capsule (40 mg total) by mouth daily. 07/31/13  Yes Shawnee Knapp, MD  potassium chloride SA (K-DUR,KLOR-CON) 20 MEQ tablet Take 20 mEq by mouth daily.   Yes Historical Provider, MD  torsemide (DEMADEX) 20 MG tablet TAKE ONE TABLET BY MOUTH TWICE DAILY Patient taking differently: Take 20 mg by mouth 2 (two) times daily.  04/01/14  Yes Hao Meng, PA   BP 137/59 mmHg  Pulse 60  Temp(Src) 97.8 F (36.6 C) (Oral)  Resp 17  SpO2 95% Physical Exam  Constitutional: He is oriented to person, place, and time. He appears well-developed and well-nourished.  HENT:  Head: Normocephalic and atraumatic.    Right Ear: External ear normal.  Left Ear: External ear normal.  Nose: Nose normal.  Eyes: EOM are normal. Right eye exhibits no discharge. Left eye exhibits no discharge.  Left eye enucleated. Right eye appears normal, normal sized and reactive pupil.   Neck: Neck supple.  Cardiovascular: Normal rate, regular rhythm, normal heart sounds and intact distal pulses.   Pulmonary/Chest: Effort normal. Tachypnea noted. He has decreased breath sounds in the right lower field.  Abdominal: Soft. There is no tenderness.    Musculoskeletal: He exhibits edema (bilateral pitting edema of legs to knees. Right hand with swelling greater than left).  Neurological: He is alert and oriented to person, place, and time.  Skin: Skin is warm and dry.  Nursing note and vitals reviewed.   ED Course  Procedures (including critical care time) Labs Review Labs Reviewed  COMPREHENSIVE METABOLIC PANEL - Abnormal; Notable for the following:    BUN 23 (*)    Calcium 8.3 (*)    Albumin 2.8 (*)    ALT 10 (*)    Total Bilirubin 3.1 (*)    All other components within normal limits  BRAIN NATRIURETIC PEPTIDE - Abnormal; Notable  for the following:    B Natriuretic Peptide 1331.7 (*)    All other components within normal limits  CBC WITH DIFFERENTIAL/PLATELET - Abnormal; Notable for the following:    Hemoglobin 12.5 (*)    HCT 38.2 (*)    RDW 18.2 (*)    Platelets 136 (*)    All other components within normal limits  I-STAT TROPOININ, ED    Imaging Review Dg Chest 2 View  10/05/2014   CLINICAL DATA:  Upper and lower extremity swelling.  EXAM: CHEST  2 VIEW  COMPARISON:  03/31/2014 chest radiograph.  FINDINGS: Dual lead left subclavian pacemaker is noted with lead tips overlying the right atrium and right ventricle. Stable cardiomediastinal silhouette with mild cardiomegaly. No pneumothorax. Moderate right pleural effusion. No left pleural effusion. Mild pulmonary edema. Patchy right basilar lung opacity, likely atelectasis. Mild degenerative changes in the thoracic spine.  IMPRESSION: 1. Mild congestive heart failure. 2. Moderate right pleural effusion. 3. Patchy right basilar lung opacity, likely atelectasis.   Electronically Signed   By: Ilona Sorrel M.D.   On: 10/05/2014 13:27   Ct Orbits W/cm  10/05/2014   CLINICAL DATA:  Right periorbital swelling.  Rule out cellulitis.  EXAM: CT ORBITS WITH CONTRAST  TECHNIQUE: Multidetector CT imaging of the orbits was performed following the bolus administration of intravenous  contrast.  CONTRAST:  12mL OMNIPAQUE IOHEXOL 300 MG/ML  SOLN  COMPARISON:  CT head 02/02/2014  FINDINGS: Right globe normal in shape and size. Lens extraction on the right. Orbital fat is normal. Extraocular muscles and optic nerve are normal on the right. No mass or edema. No evidence of orbital cellulitis or abscess. No soft tissue swelling.  Left eye enucleation with prosthesis. No swelling in the left orbit.  The bony orbit is intact bilaterally. Mild mucosal edema in the paranasal sinuses without air-fluid level. Mastoid sinus clear bilaterally.  Limited intracranial imaging reveals atrophy without acute abnormality.  IMPRESSION: Negative for orbital swelling or abscess on the right.  Left eye prosthesis without acute abnormality.  Mild mucosal edema in the paranasal sinuses without air-fluid level.   Electronically Signed   By: Franchot Gallo M.D.   On: 10/05/2014 15:37   I have personally reviewed and evaluated these images and lab results as part of my medical decision-making.   EKG Interpretation   Date/Time:  Tuesday October 05 2014 11:16:42 EDT Ventricular Rate:  62 PR Interval:  65 QRS Duration: 183 QT Interval:  486 QTC Calculation: 494 R Axis:   -72 Text Interpretation:  A-V dual-paced rhythm with some inhibition No  further analysis attempted due to paced rhythm No significant change since  Sep 06 2014 Confirmed by Regenia Skeeter  MD, [place] [phone]) on 10/05/2014 1:07:49  PM      MDM   Final diagnoses:  CHF exacerbation  Extremity edema    Patient CT scan shows no acute orbital pathology. Labs and x-ray are most consistent with CHF exacerbation. This is the most likely reason for his increased swelling in his hands. He is tachypneic here and while he is not hypoxic given his significant pleural effusion he will need IV diureitcs and admission to the hospital.    Sherwood Gambler, MD 10/05/14 [phone]

## 2014-10-05 NOTE — H&P (Signed)
History and Physical  Keith Ellis NWG:[id number] DOB: May 17, 1929 DOA: 10/05/2014  Referring physician: Sherwood Gambler, ER physician PCP: Tommi Rumps, MD   Chief Complaint: Swelling  HPI: Keith Ellis is a 81 y.o. male  Past medical history of diastolic heart failure, large left common iliac aneurysm and a flutter/A. fib on chronic anticoagulation who presented from his skilled nursing facility with complaints of hand and face swelling. CT scan of orbits noted no edema, but labs noted for an elevated BNP of 1300 and patient admitted for CHF exacerbation. On admission, he was not noted to be hypoxic.   Review of Systems:  Patient seen after arrival to floor. Pt complains of dyspnea on exertion which has been going on for the past month and worse for the last few days. Patient also complains of lower extremity pain in legs and feet, more so in the last few days especially when walking  Pt denies any headaches, vision changes, dysphagia, chest pain, palpitations, wheezing, cough, abdominal pain, hematuria, dysuria, constipation, diarrhea.  Review of systems are otherwise negative  Past Medical History  Diagnosis Date  . GERD (gastroesophageal reflux disease)   . Hypertension   . Blind     Right enucleation  . Hearing loss   . Coronary artery disease     Minimal LAD stenosis 2007  . BPH (benign prostatic hypertrophy)   . Hyperlipidemia   . Varicose vein   . Peripheral neuropathy   . Tremor, essential   . Aortic sclerosis     2008  . Aortic stenosis     Mild, echo, January, 2014  . Atrial flutter     December, 2013  . Chronic anticoagulation     Apixaban started December 27, 2011  . Edema     December, 2013  . CHF (congestive heart failure)   . Hearing difficulty of both ears     hearing aids  . Urine retention 01/08/2013  . Depression   . Urinary catheter in place   . Syncope 07/06/2013   Past Surgical History  Procedure Laterality Date  . Lung surgery     Benign lump removed  . Tumor excision      behindleft eye,abpve left eye,left foot  . Esophagogastroduodenoscopy N/A 06/23/2012    Procedure: ESOPHAGOGASTRODUODENOSCOPY (EGD);  Surgeon: Ladene Artist, MD;  Location: [facility];  Service: Endoscopy;  Laterality: N/A;  . Upper gastrointestinal endoscopy    . Cataract extraction Right   . Eye surgery Left     removal eyeball  . Transurethral resection of prostate N/A 03/11/2013    Procedure: TRANSURETHRAL RESECTION OF THE PROSTATE WITH GYRUS INSTRUMENTS;  Surgeon: Alexis Frock, MD;  Location: [facility];  Service: Urology;  Laterality: N/A;  . Permanent pacemaker insertion N/A 03/30/2014    Procedure: PERMANENT PACEMAKER INSERTION;  Surgeon: [name], MD;  Location: [facility];  Service: Cardiovascular;  Laterality: N/A;  . Peripheral vascular catheterization N/A 06/23/2014    Procedure: Abdominal Aortogram;  Surgeon: Serafina Mitchell, MD;  Location: [place] CV LAB;  Service: Cardiovascular;  Laterality: N/A;  . Peripheral vascular catheterization N/A 06/23/2014    Procedure: Pelvic Angiography;  Surgeon: Serafina Mitchell, MD;  Location: [place] [facility];  Service: Cardiovascular;  Laterality: N/A;   Social History:  reports that he quit smoking about 39 years ago. His smoking use included Cigarettes. He has quit using smokeless tobacco. He reports that he does not drink alcohol or use illicit drugs. Patient lives at  skilled nursing facility & is able to participate in activities of daily living with use of a walker and cane  Allergies  Allergen Reactions  . Menthol Hives and Itching    Family History  Problem Relation Age of Onset  . Cancer Father     Rectal  . Rectal cancer Father   . Cancer Mother     Breast  . Stomach cancer Neg Hx   . Esophageal cancer Neg Hx   . Colon cancer Neg Hx   . Cancer Sister       Prior to Admission medications   Medication Sig Start Date End Date Taking? Authorizing Provider  acetaminophen  (TYLENOL) 500 MG tablet Take 500 mg by mouth every 6 (six) hours as needed for moderate pain.   Yes Historical Provider, MD  apixaban (ELIQUIS) 5 MG TABS tablet Take 5 mg by mouth 2 (two) times daily.   Yes Historical Provider, MD  clonazePAM (KLONOPIN) 0.5 MG tablet Take 1 tablet (0.5 mg total) by mouth 2 (two) times daily as needed. for anxiety Patient taking differently: Take 0.5 mg by mouth 2 (two) times daily as needed for anxiety.  05/20/14  Yes [name], MD  escitalopram (LEXAPRO) 20 MG tablet Take 1 tablet (20 mg total) by mouth daily. 07/28/14  Yes [name], MD  hydrALAZINE (APRESOLINE) 10 MG tablet Take 1 tablet (10 mg total) by mouth every 8 (eight) hours. 04/01/14  Yes Almyra Deforest, PA  isosorbide mononitrate (IMDUR) 30 MG 24 hr tablet Take 0.5 tablets (15 mg total) by mouth daily. 04/01/14  Yes Almyra Deforest, PA  Multiple Vitamin (MULTIVITAMIN WITH MINERALS) TABS tablet Take 1 tablet by mouth daily.   Yes Historical Provider, MD  omeprazole (PRILOSEC) 40 MG capsule Take 1 capsule (40 mg total) by mouth daily. 07/31/13  Yes Shawnee Knapp, MD  potassium chloride SA (K-DUR,KLOR-CON) 20 MEQ tablet Take 20 mEq by mouth daily.   Yes Historical Provider, MD  torsemide (DEMADEX) 20 MG tablet TAKE ONE TABLET BY MOUTH TWICE DAILY Patient taking differently: Take 20 mg by mouth 2 (two) times daily.  04/01/14  Yes Almyra Deforest, PA    Physical Exam: BP 130/48 mmHg  Pulse 109  Temp(Src) 97.4 F (36.3 C) (Oral)  Resp 18  Ht 5\' 7"  (1.702 m)  Wt 89.1 kg (196 lb 6.9 oz)  BMI 30.76 kg/m2  SpO2 97%  General:  Alert and oriented 2, no acute distress Eyes: Status post left eye enucleation with prosthesis, right eye-extraocular movements intact ENT: Normocephalic, atraumatic, mucous membranes are moist Neck: No JVD Cardiovascular: Paced rhythm Respiratory: Clear to auscultation bilaterally Abdomen: Soft, nontender, nondistended, positive bowel sounds Skin: Left lower extremity wrapped, right lower  extremity notes chronic venous stasis with 1 cm ulceration from previous fluid blister Musculoskeletal: 2+ pitting edema from the knees down. See above Psychiatric: Patient is appropriate, no evidence of psychoses Neurologic: No focal deficits, mild peripheral neuropathy           Labs on Admission:  Basic Metabolic Panel:  Recent Labs Lab 10/05/14 1242  NA 139  K 4.1  CL 104  CO2 29  GLUCOSE 92  BUN 23*  CREATININE 1.04  CALCIUM 8.3*   Liver Function Tests:  Recent Labs Lab 10/05/14 1242  AST 22  ALT 10*  ALKPHOS 83  BILITOT 3.1*  PROT 6.7  ALBUMIN 2.8*   No results for input(s): LIPASE, AMYLASE in the last 168 hours. No results for input(s): AMMONIA in the  last 168 hours. CBC:  Recent Labs Lab 10/05/14 1242  WBC 7.2  NEUTROABS 5.2  HGB 12.5*  HCT 38.2*  MCV 88.6  PLT 136*   Cardiac Enzymes: No results for input(s): CKTOTAL, CKMB, CKMBINDEX, TROPONINI in the last 168 hours.  BNP (last 3 results)  Recent Labs  03/26/14 1734 08/27/14 1236 10/05/14 1242  BNP 635.3* 1065.0* 1331.7*    ProBNP (last 3 results) No results for input(s): PROBNP in the last 8760 hours.  CBG: No results for input(s): GLUCAP in the last 168 hours.  Radiological Exams on Admission: Dg Chest 2 View  10/05/2014   CLINICAL DATA:  Upper and lower extremity swelling.  EXAM: CHEST  2 VIEW  COMPARISON:  03/31/2014 chest radiograph.  FINDINGS: Dual lead left subclavian pacemaker is noted with lead tips overlying the right atrium and right ventricle. Stable cardiomediastinal silhouette with mild cardiomegaly. No pneumothorax. Moderate right pleural effusion. No left pleural effusion. Mild pulmonary edema. Patchy right basilar lung opacity, likely atelectasis. Mild degenerative changes in the thoracic spine.  IMPRESSION: 1. Mild congestive heart failure. 2. Moderate right pleural effusion. 3. Patchy right basilar lung opacity, likely atelectasis.   Electronically Signed   By: Ilona Sorrel M.D.   On: 10/05/2014 13:27   Ct Orbits W/cm  10/05/2014   CLINICAL DATA:  Right periorbital swelling.  Rule out cellulitis.  EXAM: CT ORBITS WITH CONTRAST  TECHNIQUE: Multidetector CT imaging of the orbits was performed following the bolus administration of intravenous contrast.  CONTRAST:  111mL OMNIPAQUE IOHEXOL 300 MG/ML  SOLN  COMPARISON:  CT head 02/02/2014  FINDINGS: Right globe normal in shape and size. Lens extraction on the right. Orbital fat is normal. Extraocular muscles and optic nerve are normal on the right. No mass or edema. No evidence of orbital cellulitis or abscess. No soft tissue swelling.  Left eye enucleation with prosthesis. No swelling in the left orbit.  The bony orbit is intact bilaterally. Mild mucosal edema in the paranasal sinuses without air-fluid level. Mastoid sinus clear bilaterally.  Limited intracranial imaging reveals atrophy without acute abnormality.  IMPRESSION: Negative for orbital swelling or abscess on the right.  Left eye prosthesis without acute abnormality.  Mild mucosal edema in the paranasal sinuses without air-fluid level.   Electronically Signed   By: Franchot Gallo M.D.   On: 10/05/2014 15:37    EKG: Independently reviewed. AV paced rhythm  Assessment/Plan Present on Admission:  . Acute on chronic diastolic congestive heart failure: Echocardiogram done March of this year notes grade 2 diastolic dysfunction and moderate to severe tricuspid regurg. IV Lasix. Patient is not on any beta blocker or ACE inhibitor so have started both. Daily weights and strict input/output. Her picture is that this is his fifth admission in the last 6 months and he's had a large decline in the past year. As per son's request, palliative consult  . Pressure ulcer: Present on admission. Have consulted wound care  . Venous stasis ulcer: Left greater than right. Chronic in nature, although have only been going on for the last 4 months secondary to venous stasis. Wound care  consulted  . Hypertension: Continue Imdur, that a beta blocker and ACE inhibitor  . Aneurysm artery, iliac common: Very large left sided common iliac artery aneurysm monitoring only. 6.3 cm  . Atrial flutter, chronic/chronic atrial fibrillation: Status post pacemaker. On eliquis  Consultants: Palliative medicine  Code Status: Full code as confirmed by patient after discussion  Family Communication: Son present at  the bedside   Disposition Plan: Likely here for several days, will also plan for goals of care  Time spent: 45 minutes  [facility] Pager [phone]

## 2014-10-05 NOTE — ED Notes (Signed)
Patient transported to X-ray 

## 2014-10-06 DIAGNOSIS — Z515 Encounter for palliative care: Secondary | ICD-10-CM

## 2014-10-06 DIAGNOSIS — R531 Weakness: Secondary | ICD-10-CM

## 2014-10-06 DIAGNOSIS — Z7189 Other specified counseling: Secondary | ICD-10-CM

## 2014-10-06 LAB — BASIC METABOLIC PANEL
Anion gap: 9 (ref 5–15)
BUN: 22 mg/dL — ABNORMAL HIGH (ref 6–20)
CALCIUM: 8.3 mg/dL — AB (ref 8.9–10.3)
CO2: 26 mmol/L (ref 22–32)
CREATININE: 1 mg/dL (ref 0.61–1.24)
Chloride: 106 mmol/L (ref 101–111)
GFR calc non Af Amer: >60 mL/min (ref >60)
Glucose, Bld: 91 mg/dL (ref 65–99)
Potassium: 3.6 mmol/L (ref 3.5–5.1)
SODIUM: 141 mmol/L (ref 135–145)

## 2014-10-06 MED ORDER — COLLAGENASE 250 UNIT/GM EX OINT
TOPICAL_OINTMENT | Freq: Every day | CUTANEOUS | Status: DC
  Administered 2014-10-06 – 2014-10-07 (×2): via TOPICAL
  Filled 2014-10-06 (×2): qty 30

## 2014-10-06 MED ORDER — FUROSEMIDE 10 MG/ML IJ SOLN
40.0000 mg | Freq: Two times a day (BID) | INTRAMUSCULAR | Status: DC
  Administered 2014-10-06 – 2014-10-07 (×2): 40 mg via INTRAVENOUS
  Filled 2014-10-06 (×2): qty 4

## 2014-10-06 MED ORDER — MORPHINE SULFATE (CONCENTRATE) 10 MG/0.5ML PO SOLN
5.0000 mg | ORAL | Status: DC | PRN
  Administered 2014-10-06: 5 mg via ORAL
  Filled 2014-10-06: qty 0.5

## 2014-10-06 MED ORDER — POLYETHYLENE GLYCOL 3350 17 G PO PACK
17.0000 g | PACK | Freq: Two times a day (BID) | ORAL | Status: DC
  Administered 2014-10-06 – 2014-10-07 (×2): 17 g via ORAL
  Filled 2014-10-06 (×2): qty 1

## 2014-10-06 MED ORDER — CARVEDILOL 3.125 MG PO TABS
3.1250 mg | ORAL_TABLET | Freq: Two times a day (BID) | ORAL | Status: DC
  Administered 2014-10-07: 3.125 mg via ORAL
  Filled 2014-10-06 (×2): qty 1

## 2014-10-06 NOTE — Evaluation (Signed)
Physical Therapy Evaluation Patient Details Name: Keith Ellis MRN: [id number] DOB: [birth year] Today's Date: 10/06/2014   History of Present Illness  Past medical history of diastolic heart failure, large left common iliac aneurysm and a flutter/A. fib on chronic anticoagulation, L eyeball removal, blindness, hearing loss who presented from his assisted living facility with complaints of hand and face swelling. CT scan of orbits noted no edema.  Pt admitted for CHF exacerbation  Clinical Impression  Pt admitted with above diagnosis. Pt currently with functional limitations due to the deficits listed below (see PT Problem List).  Pt reports typically being modified independent at ALF however currently requiring assist for bed mobility and transfers.  Pt may need increased care upon d/c.  Plans per chart to f/u with hospice at facility so no PT needs anticipated unless does not pursue hospice. Pt will benefit from skilled PT to increase their independence and safety with mobility to allow discharge to the venue listed below.       Follow Up Recommendations No PT follow up (unless does not follow up with hospice)    Equipment Recommendations  None recommended by PT    Recommendations for Other Services       Precautions / Restrictions Precautions Precautions: Fall Precaution Comments: very HOH      Mobility  Bed Mobility Overal bed mobility: Needs Assistance Bed Mobility: Supine to Sit     Supine to sit: Min assist;HOB elevated     General bed mobility comments: assist for trunk upright, very SOB upon getting to EOB, SpO2 97% room air  Transfers Overall transfer level: Needs assistance Equipment used: Rolling walker (2 wheeled) Transfers: Sit to/from [facility] Sit to Stand: Mod assist;From elevated surface Stand pivot transfers: Min assist       General transfer comment: assist to rise and steady, able to take a few steps over to recliner, once sitting  upright pt reports breathing improved  Ambulation/Gait Ambulation/Gait assistance:  (pt declined today, too fatigued)              Stairs            Wheelchair Mobility    Modified Rankin (Stroke Patients Only)       Balance                                             Pertinent Vitals/Pain Pain Assessment: Faces Faces Pain Scale: Hurts little more Pain Intervention(s): Premedicated before session;Repositioned;Limited activity within patient's tolerance;Monitored during session    [place] expects to be discharged to:: Assisted living               Home Equipment: Walker - 2 wheels;Cane - single point      Prior Function Level of Independence: Independent with assistive device(s)         Comments: states he uses both RW and SPC.      Hand Dominance        Extremity/Trunk Assessment               Lower Extremity Assessment: Generalized weakness         Communication   Communication: HOH  Cognition Arousal/Alertness: Awake/alert Behavior During Therapy: WFL for tasks assessed/performed Overall Cognitive Status: Within Functional Limits for tasks assessed  General Comments      Exercises        Assessment/Plan    PT Assessment Patient needs continued PT services  PT Diagnosis Difficulty walking;Generalized weakness   PT Problem List Decreased strength;Decreased activity tolerance;Decreased mobility;Decreased balance;Decreased knowledge of use of DME;Cardiopulmonary status limiting activity  PT Treatment Interventions DME instruction;Gait training;Functional mobility training;Patient/family education;Therapeutic activities;Therapeutic exercise;Wheelchair mobility training   PT Goals (Current goals can be found in the Care Plan section) Acute Rehab PT Goals PT Goal Formulation: With patient Time For Goal Achievement: 10/13/14 Potential to Achieve Goals: Good     Frequency Min 2X/week   Barriers to discharge        Co-evaluation               End of Session Equipment Utilized During Treatment: Gait belt Activity Tolerance: Patient limited by fatigue Patient left: in chair;with call bell/phone within reach Nurse Communication: Mobility status (NT aware pt up in recliner)         Time: 9357-0177 PT Time Calculation (min) (ACUTE ONLY): 17 min   Charges:   PT Evaluation $Initial PT Evaluation Tier I: 1 Procedure     PT G Codes:        Spyridon Hornstein,KATHrine E 10/06/2014, 3:45 PM Carmelia Bake, PT, DPT 10/06/2014 Pager: [phone]

## 2014-10-06 NOTE — Progress Notes (Signed)
Referral for Home with Hospice given to [facility], [phone].

## 2014-10-06 NOTE — Progress Notes (Addendum)
Notified by [facility] of family request for [facility] services at the facility after discharge. Chart and patient Information currently under review to confirm hospice eligibility.   Spoke with Yvone Neu, via phone conversation to initiate education related to hospice philosophy, services and team approach to care. Family verbalized understanding of the information provided. Per discussion plan is for discharge to [facility] tomorrow.  Please send signed completed DNR form home with patient.  Patient will need prescriptions for discharge comfort medications.   DME needs discussed and family requested hospital bed with full rails, bedside table and APP overlay for delivery to the facility today.  [facility] equipment manager Jewel Ysidro Evert notified and will contact [facility] to arrange delivery.   [facility] aware of the above.  Completed discharge summary will need to be faxed to [facility] at [phone] when final.  Please notify [facility] when patient is ready to leave unit at discharge-call [phone].   [facility] information and contact numbers have been given to [facility] during visit.   Please call with any questions.  Annia Belt RN, [facility] Liaison  [phone]

## 2014-10-06 NOTE — Consult Note (Signed)
Consultation Note Date: 10/06/2014   Patient Name: Keith Ellis  DOB: [birth year]  MRN: [id number]  Age / Sex: 81 y.o., male   PCP: Leone Haven, MD Referring Physician: Elmarie Shiley, MD  Reason for Consultation: Establishing goals of care, Non pain symptom management, Pain control and Psychosocial/spiritual support  Palliative Care Assessment and Plan Summary of Established Goals of Care and Medical Treatment Preferences    Palliative Care Discussion Held Today:    This NP Wadie Lessen reviewed medical records, received report from team, assessed the patient and then meet at the patient's bedside along with his two sons  to discuss diagnosis, prognosis, GOC, EOL wishes disposition and options.  A detailed discussion was had today regarding advanced directives.  Concepts specific to code status, artifical feeding and hydration, continued IV antibiotics and rehospitalization was had.  The difference between a aggressive medical intervention path  and a palliative comfort care path for this patient at this time was had.  Values and goals of care important to patient and family were attempted to be elicited.  Concept of Hospice and Palliative Care were discussed  Natural trajectory and expectations at EOL were discussed.  Questions and concerns addressed.   Family encouraged to call with questions or concerns.  PMT will continue to support holistically.  MOST form completed   AD received and given to unit clerk to secure   Primary Decision Maker: two sons present for meeting  Goals of Care/Code Status/Advance Care Planning:   Code Status:  DNR/DNI-comfort is main focus of care   Artificial feeding: not now or in the future, comfort feeds as tolerated   Antibiotics: make decision if/when infection occurs  Diagnostics:none  Rehospitalization: avoid   Symptom Management:    Pain/Dyspnea: Roxanol 5 mg po/sl every 1 hr prn  Agitation: continue  Lexapro/Klonopin/  Psycho-social/Spiritual:   Support System: family  Desire for further Chaplaincy support:yes  Prognosis: < 6 months   Discharge Planning:  Home with Hospice, patient lives at [facility], daughter in law works for [employer] Complaint: edema, failure to [employer]  History of Present Illness:   81 y.o. male with past medical history of diastolic heart failure, large left common iliac aneurysm and a flutter/A. fib on chronic anticoagulation who presented from his skilled nursing facility with complaints of hand and face swelling. CT scan of orbits noted no edema, but labs noted for an elevated BNP of 1300 and patient admitted for CHF exacerbation. On admission, he was not noted to be hypoxic. Continued overall failure to thrive with declining physical, functional and cognitive function.    Faced with advanced directive and anticipatory care needs  Primary Diagnoses  Present on Admission:  . Acute on chronic diastolic congestive heart failure . Pressure ulcer . Venous stasis ulcer . Hypertension . Aneurysm artery, iliac common . Atrial flutter, chronic  Palliative Review of Systems:    -weakness, fatigue   I have reviewed the medical record, interviewed the patient and family, and examined the patient. The following aspects are pertinent.  Past Medical History  Diagnosis Date  . GERD (gastroesophageal reflux disease)   . Hypertension   . Blind     Right enucleation  . Hearing loss   . Coronary artery disease     Minimal LAD stenosis 2007  . BPH (benign prostatic hypertrophy)   . Hyperlipidemia   . Varicose vein   . Peripheral neuropathy   . Tremor, essential   .  Aortic sclerosis     2008  . Aortic stenosis     Mild, echo, January, 2014  . Atrial flutter     December, 2013  . Chronic anticoagulation     Apixaban started December 27, 2011  . Edema     December, 2013  . CHF (congestive heart failure)   . Hearing difficulty of both  ears     hearing aids  . Urine retention 01/08/2013  . Depression   . Urinary catheter in place   . Syncope 07/06/2013   Social History   Social History  . Marital Status: Married    Spouse Name: Loran Senters"  . Number of Children: 3  . Years of Education: 8th grade   Occupational History  . [employer]     Retired   Social History Main Topics  . Smoking status: Former Smoker    Types: Cigarettes    Quit date: 07/20/1975  . Smokeless tobacco: Former Systems developer  . Alcohol Use: No  . Drug Use: No  . Sexual Activity: Not Currently   Other Topics Concern  . None   Social History Narrative   Patient has hostile neighbor with multiple police reports.  Patient is happily married, but son is at home and is not happy which creates anxious and frustrating relationships.    Caffeine Use: 1 cup daily   Family History  Problem Relation Age of Onset  . Cancer Father     Rectal  . Rectal cancer Father   . Cancer Mother     Breast  . Stomach cancer Neg Hx   . Esophageal cancer Neg Hx   . Colon cancer Neg Hx   . Cancer Sister    Scheduled Meds: . apixaban  5 mg Oral BID  . carvedilol  6.25 mg Oral BID WC  . escitalopram  20 mg Oral Daily  . furosemide  40 mg Intravenous Q12H  . pantoprazole  80 mg Oral Daily  . potassium chloride SA  20 mEq Oral Daily  . sodium chloride  3 mL Intravenous Q12H   Continuous Infusions:  PRN Meds:.sodium chloride, acetaminophen, clonazePAM, ondansetron (ZOFRAN) IV, oxyCODONE, sodium chloride Medications Prior to Admission:  Prior to Admission medications   Medication Sig Start Date End Date Taking? Authorizing Provider  acetaminophen (TYLENOL) 500 MG tablet Take 500 mg by mouth every 6 (six) hours as needed for moderate pain.   Yes Historical Provider, MD  apixaban (ELIQUIS) 5 MG TABS tablet Take 5 mg by mouth 2 (two) times daily.   Yes Historical Provider, MD  clonazePAM (KLONOPIN) 0.5 MG tablet Take 1 tablet (0.5 mg total) by mouth 2 (two)  times daily as needed. for anxiety Patient taking differently: Take 0.5 mg by mouth 2 (two) times daily as needed for anxiety.  05/20/14  Yes [name], MD  escitalopram (LEXAPRO) 20 MG tablet Take 1 tablet (20 mg total) by mouth daily. 07/28/14  Yes [name], MD  hydrALAZINE (APRESOLINE) 10 MG tablet Take 1 tablet (10 mg total) by mouth every 8 (eight) hours. 04/01/14  Yes Almyra Deforest, PA  isosorbide mononitrate (IMDUR) 30 MG 24 hr tablet Take 0.5 tablets (15 mg total) by mouth daily. 04/01/14  Yes Almyra Deforest, PA  Multiple Vitamin (MULTIVITAMIN WITH MINERALS) TABS tablet Take 1 tablet by mouth daily.   Yes Historical Provider, MD  omeprazole (PRILOSEC) 40 MG capsule Take 1 capsule (40 mg total) by mouth daily. 07/31/13  Yes Shawnee Knapp, MD  potassium  chloride SA (K-DUR,KLOR-CON) 20 MEQ tablet Take 20 mEq by mouth daily.   Yes Historical Provider, MD  torsemide (DEMADEX) 20 MG tablet TAKE ONE TABLET BY MOUTH TWICE DAILY Patient taking differently: Take 20 mg by mouth 2 (two) times daily.  04/01/14  Yes Almyra Deforest, PA   Allergies  Allergen Reactions  . Menthol Hives and Itching   CBC:    Component Value Date/Time   WBC 7.2 10/05/2014 1242   WBC 8.9 09/14/2013 1251   HGB 12.5* 10/05/2014 1242   HGB 14.0* 09/14/2013 1251   HCT 38.2* 10/05/2014 1242   HCT 42.5* 09/14/2013 1251   PLT 136* 10/05/2014 1242   MCV 88.6 10/05/2014 1242   MCV 91.5 09/14/2013 1251   NEUTROABS 5.2 10/05/2014 1242   LYMPHSABS 1.2 10/05/2014 1242   MONOABS 0.6 10/05/2014 1242   EOSABS 0.1 10/05/2014 1242   BASOSABS 0.0 10/05/2014 1242   Comprehensive Metabolic Panel:    Component Value Date/Time   NA 141 10/06/2014 0437   K 3.6 10/06/2014 0437   CL 106 10/06/2014 0437   CO2 26 10/06/2014 0437   BUN 22* 10/06/2014 0437   CREATININE 1.00 10/06/2014 0437   CREATININE 1.18* 08/12/2014 1535   GLUCOSE 91 10/06/2014 0437   CALCIUM 8.3* 10/06/2014 0437   AST 22 10/05/2014 1242   ALT 10* 10/05/2014 1242    ALKPHOS 83 10/05/2014 1242   BILITOT 3.1* 10/05/2014 1242   PROT 6.7 10/05/2014 1242   ALBUMIN 2.8* 10/05/2014 1242    Physical Exam:  Vital Signs: BP 101/49 mmHg  Pulse 102  Temp(Src) 97.5 F (36.4 C) (Oral)  Resp 17  Ht 5\' 7"  (1.702 m)  Wt 88.2 kg (194 lb 7.1 oz)  BMI 30.45 kg/m2  SpO2 96% SpO2: SpO2: 96 % O2 Device: O2 Device: Not Delivered O2 Flow Rate:   Intake/output summary:  Intake/Output Summary (Last 24 hours) at 10/06/14 1202 Last data filed at 10/06/14 0853  Gross per 24 hour  Intake    480 ml  Output    700 ml  Net   -220 ml   LBM: Last BM Date: 10/04/14 Baseline Weight: Weight: 89.1 kg (196 lb 6.9 oz) Most recent weight: Weight: 88.2 kg (194 lb 7.1 oz) (Simultaneous filing. User may not have seen previous data.)  Exam Findings:   General: ill appearing, lethargic CVS: tachycardic Resp: decreased in bases Abd: soft NT +BS Skin: noted skin breaks, spoke to Wound care nurse to see this afternoon Neuro: confused to time and place           Palliative Performance Scale: 30 % at best                Additional Data Reviewed: Recent Labs     10/05/14  1242  10/06/14  0437  WBC  7.2   --   HGB  12.5*   --   PLT  136*   --   NA  139  141  BUN  23*  22*  CREATININE  1.04  1.00     Time In: 1200 Time Out: 1330 Time Total: 90 min  Greater than 50%  of this time was spent counseling and coordinating care related to the above assessment and plan.  Discussed with  Dr  Tyrell Antonio  Signed by: Wadie Lessen, NP  Knox Royalty, NP  10/06/2014, 12:02 PM  Please contact Palliative Medicine Team phone at [phone] for questions and concerns.   See AMION for contact information

## 2014-10-06 NOTE — Consult Note (Signed)
WOC wound consult note Reason for Consult: Patient is here for CHF exacerbation. Currently has chronic venous stasis ulcers to bilateral pretibial legs.  Left lateral leg as well.  Duration greater than 4 months.  Wound type:Venous stasis ulcers.   Pressure Ulcer POA: N/A Measurement:Left leg (pretibial and left lateral lower leg):  7 cm x 6 cm x 0.2 cm with 100% fibrin slough to wound bed.  Right anterior leg :  2 cm x 1.5 cm x 0.1 cm wound bed is 100% fibrin slough.  Will begin enzymatic debridement to these ulcers.  Bilateral lower legs have generalized edema.  Chronic skin changes, consistent with hemosiderin staining present.  Wound bed:100% fibrin slough.  Drainage (amount, consistency, odor) Moderate creamy drainage.  No odor.  Periwound:Edema, erythema and chronic skin changes.  Dressing procedure/placement/frequency:Cleanse ulcers to bilateral lower legs with NS and pat gently dry.  Apply Santyl to wound bed, 1/8 inch thickness (opaque).  Cover with NS moist gauze.  Secure with kerlix and tape.  Change daily.  Will not follow at this time.  Please re-consult if needed.  Domenic Moras RN BSN [place] Pager [phone]

## 2014-10-06 NOTE — Care Management Note (Signed)
Case Management Note  Patient Details  Name: Keith Ellis MRN: [id number] Date of Birth: 1929/09/12  Subjective/Objective:81 y/o m admitted w/CHF. From ALF-[facility]. PT cons-Await recommendations.CSW following.                    Action/Plan:d/c plan return ALF   Expected Discharge Date:   (unknown)               Expected Discharge Plan:  Assisted Living / Rest Home  In-House Referral:  Clinical Social Work  Discharge planning Services  CM Consult  Post Acute Care Choice:    Choice offered to:     DME Arranged:    DME Agency:     HH Arranged:    [place] Agency:     Status of Service:  In process, will continue to follow  Medicare Important Message Given:    Date Medicare IM Given:    Medicare IM give by:    Date Additional Medicare IM Given:    Additional Medicare Important Message give by:     If discussed at [place] of Stay Meetings, dates discussed:    Additional Comments:  Dessa Phi, RN 10/06/2014, 10:31 AM

## 2014-10-06 NOTE — Progress Notes (Signed)
[facility] PROGRESS NOTE  BOLTON CANUPP JYN:[id number] DOB: 01-07-1930 DOA: 10/05/2014 PCP: Tommi Rumps, MD  Assessment/Plan: Keith Ellis is a 81 y.o. male  Past medical history of diastolic heart failure, large left common iliac aneurysm and a flutter/A. fib on chronic anticoagulation who presented from his skilled nursing facility with complaints of hand and face swelling. CT scan of orbits noted no edema, but labs noted for an elevated BNP of 1300 and patient admitted for CHF exacerbation. On admission, he was not noted to be hypoxic.  Acute on chronic diastolic congestive heart failure:  -Echocardiogram [id number]  grade 2 diastolic dysfunction and moderate to severe tricuspid regurg.  -Continue with IV Lasix.  - Daily weights and strict input/output.  -Started on coreg. Hold lisinopril to avoid hypotension.  -weight 194 pounds.   Pressure ulcer: Present on admission. wound care consulted.   Venous stasis ulcer: Left greater than right. Chronic in nature, although have only been going on for the last 4 months secondary to venous stasis. Wound care consulted   Hypertension:hold Imdur,  Ace, hydralazine to avoid hypotension. SBP soft.   Aneurysm artery, iliac common: Very large left sided common iliac artery aneurysm monitoring only. 6.3 cm   Atrial flutter, chronic/chronic atrial fibrillation: Status post pacemaker. On eliquis    Code Status: Full Code.  Family Communication: care discussed with patient.  Disposition Plan: continue with diuresis. Palliative care consulted for goals of care.    Consultants:  Palliative care   Procedures:  none  Antibiotics:  none  HPI/Subjective: Feeling ok, needs something to relax him. Denies dyspnea.   Objective: Filed Vitals:   10/06/14 0912  BP:   Pulse: 102  Temp:   Resp:     Intake/Output Summary (Last 24 hours) at 10/06/14 1027 Last data filed at 10/06/14 0853  Gross per 24 hour  Intake    480 ml   Output    700 ml  Net   -220 ml   Filed Weights   10/05/14 1813 10/06/14 0500  Weight: 89.1 kg (196 lb 6.9 oz) 88.2 kg (194 lb 7.1 oz)    Exam:   General:  Alert in no distress  Cardiovascular: S 1, S 2 RRR  Respiratory: Crackles bilaterally  Abdomen: BS present, soft, nt, mild distended.   Musculoskeletal: no edema   Data Reviewed: Basic Metabolic Panel:  Recent Labs Lab 10/05/14 1242 10/06/14 0437  NA 139 141  K 4.1 3.6  CL 104 106  CO2 29 26  GLUCOSE 92 91  BUN 23* 22*  CREATININE 1.04 1.00  CALCIUM 8.3* 8.3*   Liver Function Tests:  Recent Labs Lab 10/05/14 1242  AST 22  ALT 10*  ALKPHOS 83  BILITOT 3.1*  PROT 6.7  ALBUMIN 2.8*   No results for input(s): LIPASE, AMYLASE in the last 168 hours. No results for input(s): AMMONIA in the last 168 hours. CBC:  Recent Labs Lab 10/05/14 1242  WBC 7.2  NEUTROABS 5.2  HGB 12.5*  HCT 38.2*  MCV 88.6  PLT 136*   Cardiac Enzymes: No results for input(s): CKTOTAL, CKMB, CKMBINDEX, TROPONINI in the last 168 hours. BNP (last 3 results)  Recent Labs  03/26/14 1734 08/27/14 1236 10/05/14 1242  BNP 635.3* 1065.0* 1331.7*    ProBNP (last 3 results) No results for input(s): PROBNP in the last 8760 hours.  CBG: No results for input(s): GLUCAP in the last 168 hours.  No results found for this or any previous visit (from the  past 240 hour(s)).   Studies: Dg Chest 2 View  10/05/2014   CLINICAL DATA:  Upper and lower extremity swelling.  EXAM: CHEST  2 VIEW  COMPARISON:  03/31/2014 chest radiograph.  FINDINGS: Dual lead left subclavian pacemaker is noted with lead tips overlying the right atrium and right ventricle. Stable cardiomediastinal silhouette with mild cardiomegaly. No pneumothorax. Moderate right pleural effusion. No left pleural effusion. Mild pulmonary edema. Patchy right basilar lung opacity, likely atelectasis. Mild degenerative changes in the thoracic spine.  IMPRESSION: 1. Mild  congestive heart failure. 2. Moderate right pleural effusion. 3. Patchy right basilar lung opacity, likely atelectasis.   Electronically Signed   By: Ilona Sorrel M.D.   On: 10/05/2014 13:27   Ct Orbits W/cm  10/05/2014   CLINICAL DATA:  Right periorbital swelling.  Rule out cellulitis.  EXAM: CT ORBITS WITH CONTRAST  TECHNIQUE: Multidetector CT imaging of the orbits was performed following the bolus administration of intravenous contrast.  CONTRAST:  169mL OMNIPAQUE IOHEXOL 300 MG/ML  SOLN  COMPARISON:  CT head 02/02/2014  FINDINGS: Right globe normal in shape and size. Lens extraction on the right. Orbital fat is normal. Extraocular muscles and optic nerve are normal on the right. No mass or edema. No evidence of orbital cellulitis or abscess. No soft tissue swelling.  Left eye enucleation with prosthesis. No swelling in the left orbit.  The bony orbit is intact bilaterally. Mild mucosal edema in the paranasal sinuses without air-fluid level. Mastoid sinus clear bilaterally.  Limited intracranial imaging reveals atrophy without acute abnormality.  IMPRESSION: Negative for orbital swelling or abscess on the right.  Left eye prosthesis without acute abnormality.  Mild mucosal edema in the paranasal sinuses without air-fluid level.   Electronically Signed   By: Franchot Gallo M.D.   On: 10/05/2014 15:37    Scheduled Meds: . apixaban  5 mg Oral BID  . carvedilol  6.25 mg Oral BID WC  . escitalopram  20 mg Oral Daily  . furosemide  20 mg Intravenous Q12H  . hydrALAZINE  10 mg Oral 3 times per day  . isosorbide mononitrate  15 mg Oral Daily  . lisinopril  2.5 mg Oral Daily  . pantoprazole  80 mg Oral Daily  . potassium chloride SA  20 mEq Oral Daily  . sodium chloride  3 mL Intravenous Q12H   Continuous Infusions:   Principal Problem:   Acute on chronic diastolic congestive heart failure Active Problems:   Hypertension   Hearing impaired   Peripheral neuropathy   Chronic anticoagulation    Venous stasis ulcer   Pressure ulcer   Aneurysm artery, iliac common   Atrial flutter, chronic    Time spent: 35 minutes.     Niel Hummer A  [facility] Pager [phone]. If 7PM-7AM, please contact night-coverage at [email or web address], password [facility] 10/06/2014, 10:27 AM  LOS: 1 day

## 2014-10-07 ENCOUNTER — Telehealth: Payer: Self-pay | Admitting: Family Medicine

## 2014-10-07 LAB — BASIC METABOLIC PANEL
ANION GAP: 8 (ref 5–15)
BUN: 21 mg/dL — ABNORMAL HIGH (ref 6–20)
CALCIUM: 8.2 mg/dL — AB (ref 8.9–10.3)
CO2: 27 mmol/L (ref 22–32)
CREATININE: 1 mg/dL (ref 0.61–1.24)
Chloride: 104 mmol/L (ref 101–111)
Glucose, Bld: 104 mg/dL — ABNORMAL HIGH (ref 65–99)
Potassium: 3.6 mmol/L (ref 3.5–5.1)
SODIUM: 139 mmol/L (ref 135–145)

## 2014-10-07 MED ORDER — BISACODYL 10 MG RE SUPP
10.0000 mg | Freq: Once | RECTAL | Status: AC
  Administered 2014-10-07: 10 mg via RECTAL
  Filled 2014-10-07: qty 1

## 2014-10-07 MED ORDER — POLYETHYLENE GLYCOL 3350 17 G PO PACK: 17.0000 g | PACK | Freq: Two times a day (BID) | ORAL | Status: AC

## 2014-10-07 MED ORDER — FUROSEMIDE 40 MG PO TABS: 40.0000 mg | ORAL_TABLET | Freq: Two times a day (BID) | ORAL | Status: AC

## 2014-10-07 MED ORDER — FUROSEMIDE 40 MG PO TABS: 40.0000 mg | ORAL_TABLET | Freq: Two times a day (BID) | ORAL | Status: DC

## 2014-10-07 MED ORDER — MORPHINE SULFATE (CONCENTRATE) 10 MG/0.5ML PO SOLN: 5.0000 mg | ORAL | Status: AC | PRN

## 2014-10-07 MED ORDER — CARVEDILOL 3.125 MG PO TABS: 3.1250 mg | ORAL_TABLET | Freq: Two times a day (BID) | ORAL | Status: AC

## 2014-10-07 MED ORDER — CLONAZEPAM 0.5 MG PO TABS: 0.5000 mg | ORAL_TABLET | Freq: Two times a day (BID) | ORAL | Status: AC | PRN

## 2014-10-07 NOTE — Discharge Summary (Signed)
Physician Discharge Summary  MOHD CLEMONS HQI:[id number] DOB: Oct 20, 1929 DOA: 10/05/2014  PCP: Tommi Rumps, MD  Admit date: 10/05/2014 Discharge date: 10/07/2014  Time spent: 35 minutes  Recommendations for Outpatient Follow-up:  Needs to be follow up by hospice, palliative care team.  Adjust diuretics as needed.   Discharge Diagnoses:    Acute on chronic diastolic congestive heart failure   Hypertension   Hearing impaired   Peripheral neuropathy   Chronic anticoagulation   Venous stasis ulcer   Pressure ulcer   Aneurysm artery, iliac common   Atrial flutter, chronic   DNR (do not resuscitate) discussion   Weakness generalized   Palliative care encounter   Discharge Condition: stable.   Diet recommendation: heart healthy  Filed Weights   10/05/14 1813 10/06/14 0500 10/07/14 0511  Weight: 89.1 kg (196 lb 6.9 oz) 88.2 kg (194 lb 7.1 oz) 90.5 kg (199 lb 8.3 oz)    History of present illness:  Keith Ellis is a 81 y.o. male  Past medical history of diastolic heart failure, large left common iliac aneurysm and a flutter/A. fib on chronic anticoagulation who presented from his skilled nursing facility with complaints of hand and face swelling. CT scan of orbits noted no edema, but labs noted for an elevated BNP of 1300 and patient admitted for CHF exacerbation. On admission, he was not noted to be hypoxic.  Hospital Course:  Patient admitted with hear failure exacerbation. Patient has been admitted multiple time to hospital. Family has notice significant decline. Family request palliative care consult. Family met with palliative care team. Patient is DNR/DNI, no artificial nutrition,  avoid hospitalizations.  regarding antibiotics they will make decision if infection occurs.  Acute on chronic diastolic congestive heart failure:  -Echocardiogram [id number] grade 2 diastolic dysfunction and moderate to severe tricuspid regurg.  -received  IV Lasix. he will be discharge on  40 mg BID>  - Daily weights and strict input/output. -Started on coreg. Hold lisinopril to avoid hypotension.   Pressure ulcer: Present on admission. wound care consulted. local care.   Venous stasis ulcer: Left greater than right. Chronic in nature, although have only been going on for the last 4 months secondary to venous stasis. Wound care consulted   Hypertension:hold Imdur, Ace, hydralazine to avoid hypotension. SBP soft.   Aneurysm artery, iliac common: Very large left sided common iliac artery aneurysm monitoring only. 6.3 cm   Atrial flutter, chronic/chronic atrial fibrillation: Status post pacemaker. On eliquis  Procedures:  none  Consultations:  Palliative care team.   Discharge Exam: Filed Vitals:   10/07/14 0511  BP: 127/50  Pulse: 63  Temp: 97.6 F (36.4 C)  Resp: 18    General: NAD Cardiovascular: S 1, S 2 IRR Respiratory: CTA  Discharge Instructions   Discharge Instructions    Diet - low sodium heart healthy    Complete by:  As directed      Increase activity slowly    Complete by:  As directed           Current Discharge Medication List    START taking these medications   Details  carvedilol (COREG) 3.125 MG tablet Take 1 tablet (3.125 mg total) by mouth 2 (two) times daily with a meal. Qty: 30 tablet, Refills: 0    furosemide (LASIX) 40 MG tablet Take 1 tablet (40 mg total) by mouth 2 (two) times daily. Qty: 30 tablet, Refills: 0    Morphine Sulfate (MORPHINE CONCENTRATE) 10 MG/0.5ML SOLN concentrated solution Take  0.25 mLs (5 mg total) by mouth every hour as needed for moderate pain, severe pain or shortness of breath. Qty: 42 mL, Refills: 0    polyethylene glycol (MIRALAX / GLYCOLAX) packet Take 17 g by mouth 2 (two) times daily. Qty: 14 each, Refills: 0      CONTINUE these medications which have CHANGED   Details  clonazePAM (KLONOPIN) 0.5 MG tablet Take 1 tablet (0.5 mg total) by mouth 2 (two) times daily as needed for  anxiety. Qty: 60 tablet, Refills: 5      CONTINUE these medications which have NOT CHANGED   Details  acetaminophen (TYLENOL) 500 MG tablet Take 500 mg by mouth every 6 (six) hours as needed for moderate pain.    apixaban (ELIQUIS) 5 MG TABS tablet Take 5 mg by mouth 2 (two) times daily.    escitalopram (LEXAPRO) 20 MG tablet Take 1 tablet (20 mg total) by mouth daily. Qty: 30 tablet, Refills: 3   Associated Diagnoses: Panic disorder    Multiple Vitamin (MULTIVITAMIN WITH MINERALS) TABS tablet Take 1 tablet by mouth daily.    omeprazole (PRILOSEC) 40 MG capsule Take 1 capsule (40 mg total) by mouth daily. Qty: 30 capsule, Refills: 5    potassium chloride SA (K-DUR,KLOR-CON) 20 MEQ tablet Take 20 mEq by mouth daily.      STOP taking these medications     hydrALAZINE (APRESOLINE) 10 MG tablet      isosorbide mononitrate (IMDUR) 30 MG 24 hr tablet      torsemide (DEMADEX) 20 MG tablet        Allergies  Allergen Reactions  . Menthol Hives and Itching   Follow-up Information    Follow up with Tommi Rumps, MD In 2 weeks.   Specialty:  Family Medicine   Contact information:   [address] [place] [address] [phone]        The results of significant diagnostics from this hospitalization (including imaging, microbiology, ancillary and laboratory) are listed below for reference.    Significant Diagnostic Studies: Dg Chest 2 View  10/05/2014   CLINICAL DATA:  Upper and lower extremity swelling.  EXAM: CHEST  2 VIEW  COMPARISON:  03/31/2014 chest radiograph.  FINDINGS: Dual lead left subclavian pacemaker is noted with lead tips overlying the right atrium and right ventricle. Stable cardiomediastinal silhouette with mild cardiomegaly. No pneumothorax. Moderate right pleural effusion. No left pleural effusion. Mild pulmonary edema. Patchy right basilar lung opacity, likely atelectasis. Mild degenerative changes in the thoracic spine.  IMPRESSION: 1. Mild  congestive heart failure. 2. Moderate right pleural effusion. 3. Patchy right basilar lung opacity, likely atelectasis.   Electronically Signed   By: Ilona Sorrel M.D.   On: 10/05/2014 13:27   Ct Orbits W/cm  10/05/2014   CLINICAL DATA:  Right periorbital swelling.  Rule out cellulitis.  EXAM: CT ORBITS WITH CONTRAST  TECHNIQUE: Multidetector CT imaging of the orbits was performed following the bolus administration of intravenous contrast.  CONTRAST:  156m OMNIPAQUE IOHEXOL 300 MG/ML  SOLN  COMPARISON:  CT head 02/02/2014  FINDINGS: Right globe normal in shape and size. Lens extraction on the right. Orbital fat is normal. Extraocular muscles and optic nerve are normal on the right. No mass or edema. No evidence of orbital cellulitis or abscess. No soft tissue swelling.  Left eye enucleation with prosthesis. No swelling in the left orbit.  The bony orbit is intact bilaterally. Mild mucosal edema in the paranasal sinuses without air-fluid level. Mastoid  sinus clear bilaterally.  Limited intracranial imaging reveals atrophy without acute abnormality.  IMPRESSION: Negative for orbital swelling or abscess on the right.  Left eye prosthesis without acute abnormality.  Mild mucosal edema in the paranasal sinuses without air-fluid level.   Electronically Signed   By: Franchot Gallo M.D.   On: 10/05/2014 15:37    Microbiology: No results found for this or any previous visit (from the past 240 hour(s)).   Labs: Basic Metabolic Panel:  Recent Labs Lab 10/05/14 1242 10/06/14 0437 10/07/14 0437  NA 139 141 139  K 4.1 3.6 3.6  CL 104 106 104  CO2 _0 GLUCOSE 92 91 104*  BUN 23* 22* 21*  CREATININE 1.04 1.00 1.00  CALCIUM 8.3* 8.3* 8.2*   Liver Function Tests:  Recent Labs Lab 10/05/14 1242  AST 22  ALT 10*  ALKPHOS 83  BILITOT 3.1*  PROT 6.7  ALBUMIN 2.8*   No results for input(s): LIPASE, AMYLASE in the last 168 hours. No results for input(s): AMMONIA in the last 168  hours. CBC:  Recent Labs Lab 10/05/14 1242  WBC 7.2  NEUTROABS 5.2  HGB 12.5*  HCT 38.2*  MCV 88.6  PLT 136*   Cardiac Enzymes: No results for input(s): CKTOTAL, CKMB, CKMBINDEX, TROPONINI in the last 168 hours. BNP: BNP (last 3 results)  Recent Labs  03/26/14 1734 08/27/14 1236 10/05/14 1242  BNP 635.3* 1065.0* 1331.7*    ProBNP (last 3 results) No results for input(s): PROBNP in the last 8760 hours.  CBG: No results for input(s): GLUCAP in the last 168 hours.     Signed:  Niel Hummer A  [facility] 10/07/2014, 9:58 AM

## 2014-10-07 NOTE — Telephone Encounter (Signed)
Hospital called to schedule a hospital follow up (swelling)  for pt.. ( appt already scheduled). Please advise pt

## 2014-10-07 NOTE — Progress Notes (Signed)
Pt for discharge back to [facility].  CSW received notification from facility that facility reviewed pt discharge information and confirmed that pt can return. [facility] aware that pt returning with Hospice and [place] to follow at ALF.   CSW facilitated pt discharge needs including discussing with pt son, Yvone Neu, providing RN phone number to call report, and arranging ambulance transport for pt return to [facility].  No further social work needs identified at this time.  CSW signing off.   Alison Murray, MSW, [place] Work [phone]

## 2014-10-07 NOTE — Telephone Encounter (Signed)
Thank you.  I will notify the patient when TCM call is placed.

## 2014-10-07 NOTE — Progress Notes (Signed)
CSW continuing to follow.   CSW noted pt recommended for hospice at [facility].   CSW visited pt room. Pt sleeping at this time. No family present at bedside.   CSW contacted pt son, Keith Ellis via telephone. CSW introduced self and explained role. Pt son confirmed plans to return to [facility] with [facility] [place].   CSW contacted [facility] and confirmed that pt can return when medically ready for discharge.  CSW to continue to follow to provide support and assist with pt return to [facility] when pt medically stable for discharge.  Keith Ellis, MSW, [place] Work [phone]

## 2014-10-07 NOTE — Progress Notes (Signed)
CSW received notification that pt medically ready for return to [facility] today.  CSW contacted [facility] and confirmed that pt can return today. CSW faxed pt discharge information to [facility]. [facility] stated that the facility RN will arrive at 1 pm and review information and notify CSW when CSW can arrange transportation for pt back to [facility].  CSW updated pt son, Yvone Neu via telephone.  CSW to continue to follow.  Alison Murray, MSW, [place] Work [phone]

## 2014-10-07 NOTE — Progress Notes (Signed)
Attempted to call report to receiving RN at [facility]. Voice mail answered. Lind Guest, RN

## 2014-10-08 ENCOUNTER — Telehealth: Payer: Self-pay

## 2014-10-08 ENCOUNTER — Encounter: Payer: Self-pay | Admitting: Cardiology

## 2014-10-08 NOTE — Telephone Encounter (Signed)
Unable to reach patient.  First TCM call attempt. Will continue to follow.

## 2014-10-08 NOTE — Telephone Encounter (Signed)
Spoke with son [name]) in regards to patients transitional care management after discharge.  Patient has been counseled by Hospice and moved to Palliative Care.  Family requests to cancel upcoming appointment on 10/18/14.  No TCM. No HFU.

## 2014-10-15 ENCOUNTER — Telehealth: Payer: Self-pay | Admitting: Family Medicine

## 2014-10-15 ENCOUNTER — Telehealth: Payer: Self-pay | Admitting: Cardiology

## 2014-10-15 NOTE — Telephone Encounter (Signed)
FYI, Pt son called stating that he was just taken to into Hospice Care. Son wanted to cancel any future appointments, but pt did not have any with [place]. Just wanted to give a heads up. Thank You!

## 2014-10-15 NOTE — Telephone Encounter (Signed)
FYI

## 2014-10-15 NOTE — Telephone Encounter (Signed)
New Message  Pt son calling to let office know that pt has been placed in hospice care.

## 2014-10-15 NOTE — Telephone Encounter (Signed)
Noted. Thanks.

## 2014-10-16 NOTE — Telephone Encounter (Signed)
Keith Ellis,  Please check with the device clinic.  He probably does not need any follow up.  Please remove all reminders for follow up.

## 2014-10-18 ENCOUNTER — Ambulatory Visit: Payer: Medicare HMO | Admitting: Family Medicine

## 2014-10-20 ENCOUNTER — Encounter: Payer: Self-pay | Admitting: Internal Medicine

## 2014-10-20 NOTE — Telephone Encounter (Signed)
Noted in [facility].  

## 2014-10-25 ENCOUNTER — Encounter: Payer: Self-pay | Admitting: Cardiology

## 2014-11-09 DEATH — deceased

## 2016-06-13 IMAGING — CR DG WRIST COMPLETE 3+V*L*
2 series · 2 of 2 positions shown · non-contrast
Comparison: None.

CLINICAL DATA: Acute left wrist pain

EXAM:
LEFT WRIST - COMPLETE 3+ VIEW

[PA]
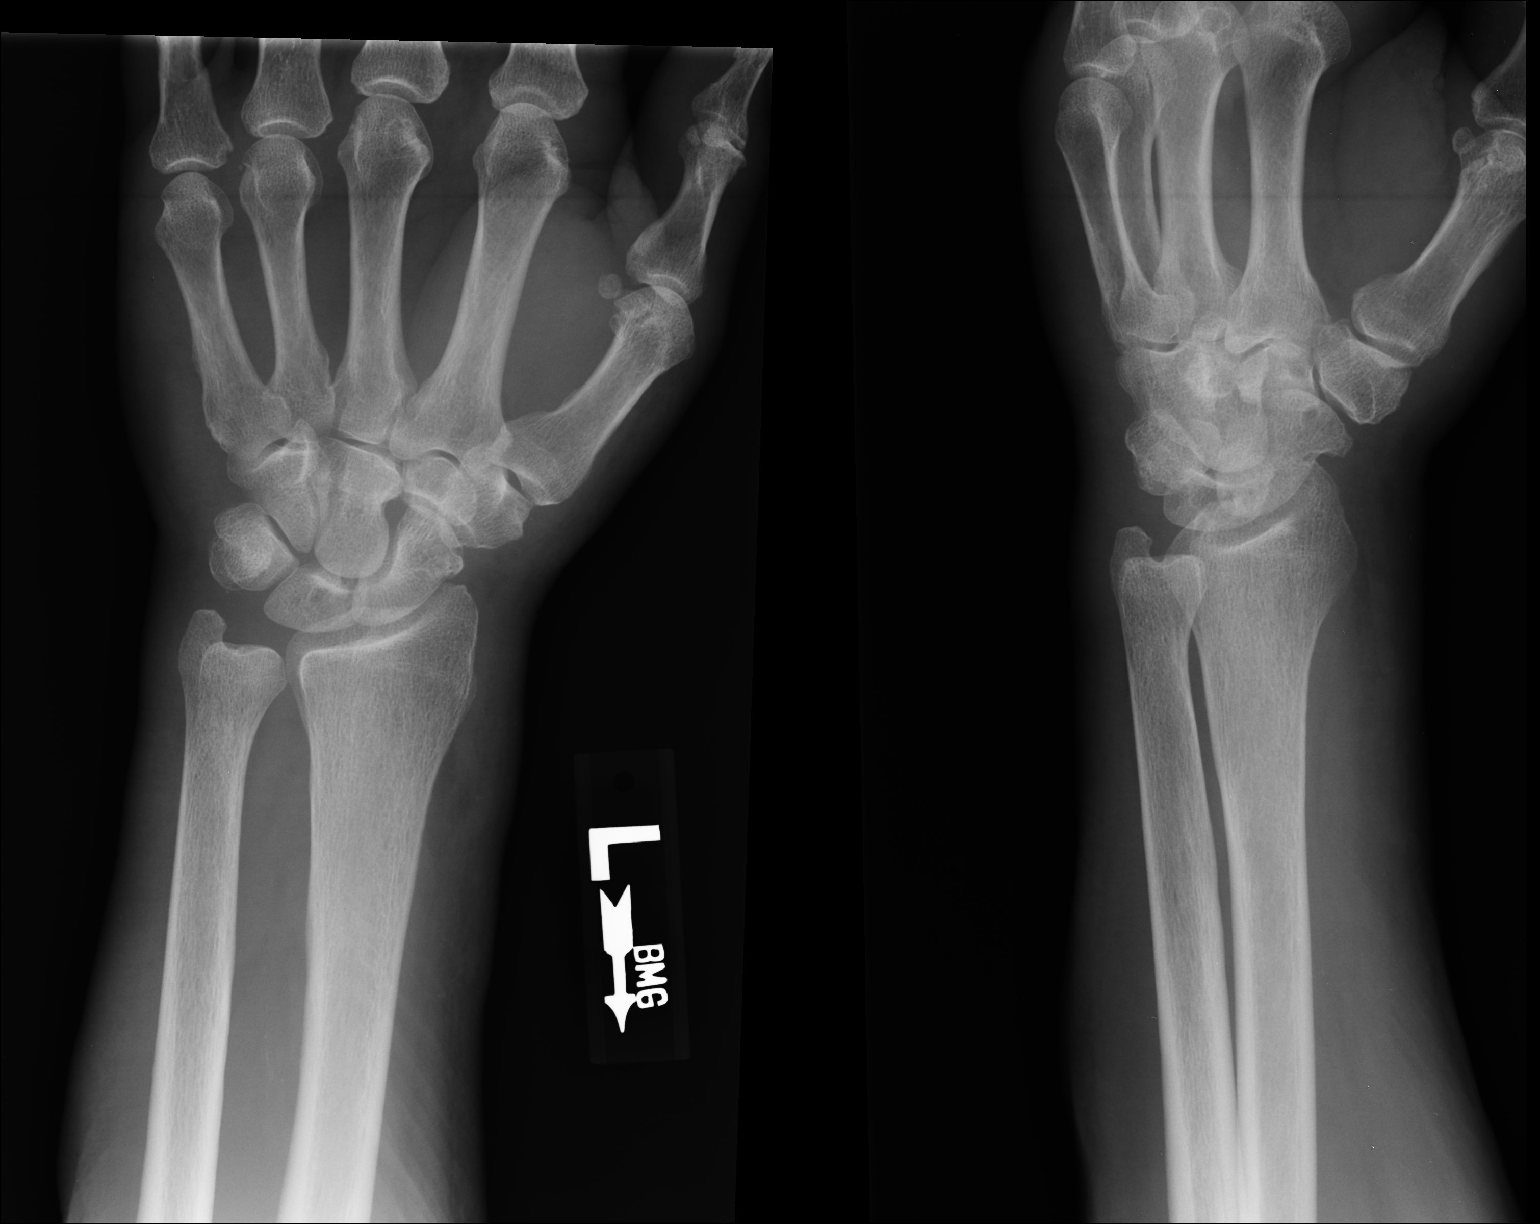

[lateral]
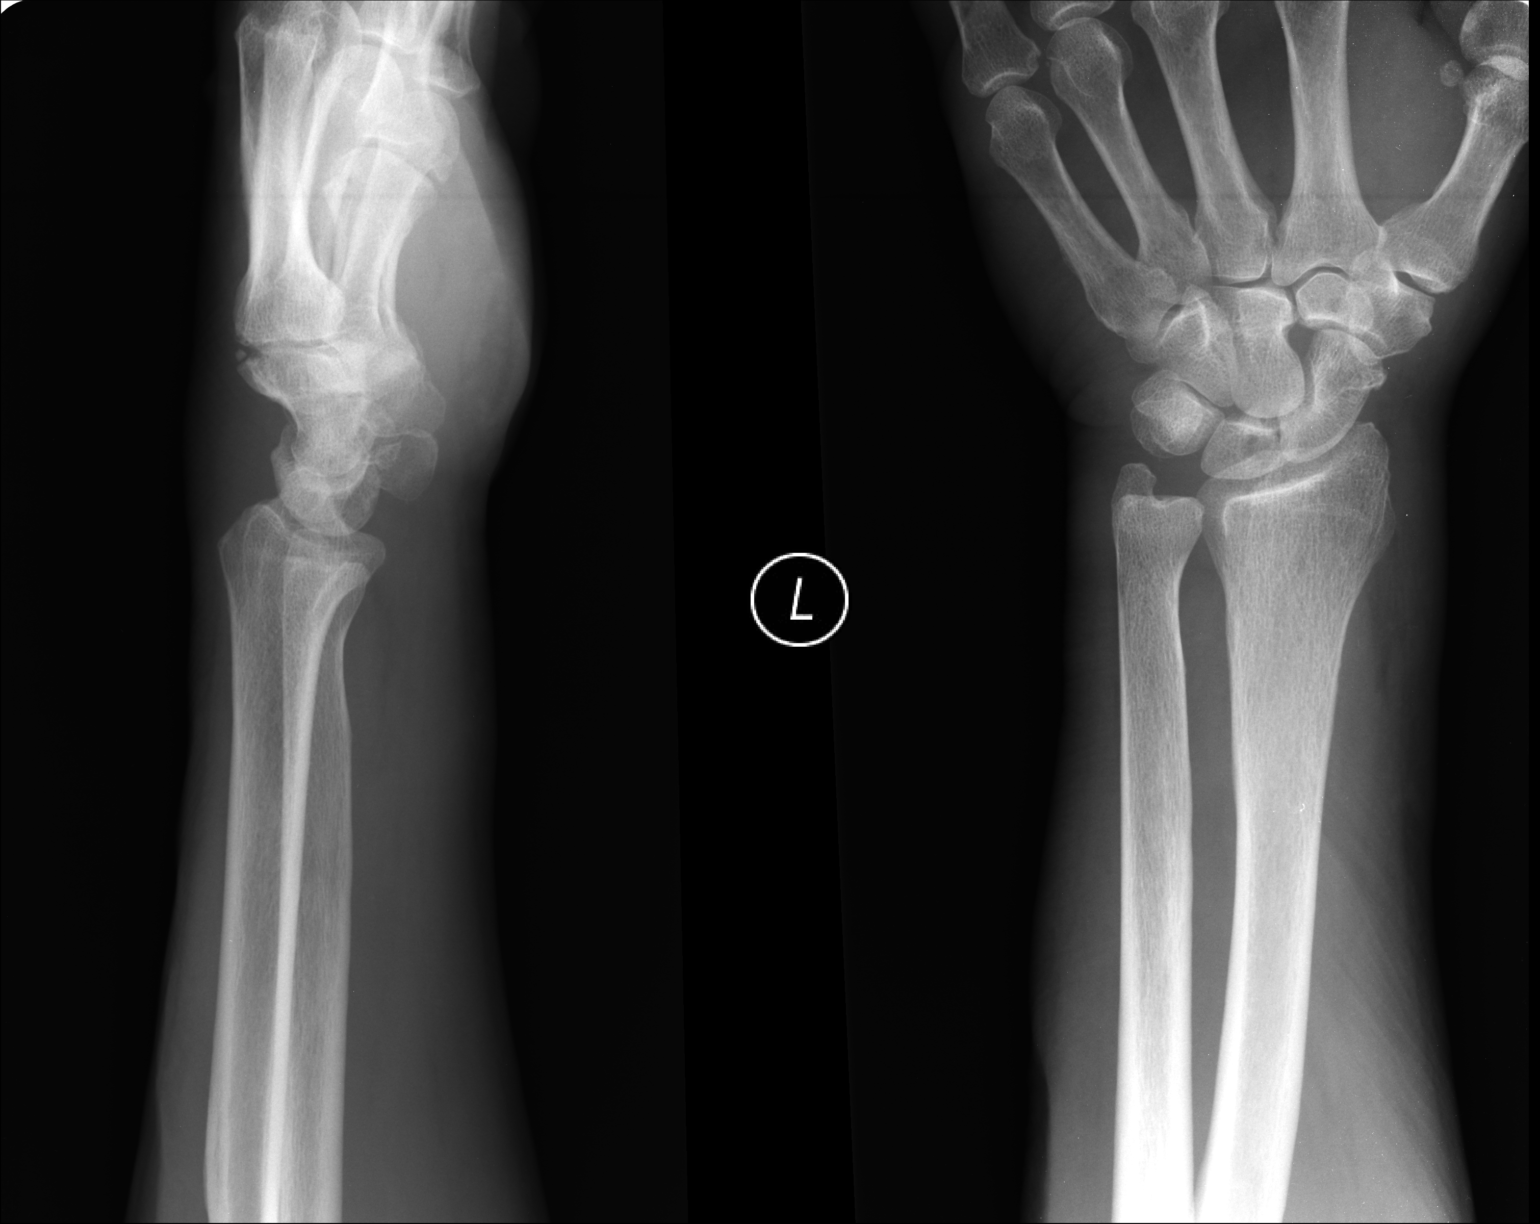

[2 of 2 positions shown; findings below may reference images not displayed]

FINDINGS: There is no evidence of fracture or dislocation. There is no
evidence of arthropathy or other focal bone abnormality. Soft
tissues are unremarkable.
IMPRESSION: Negative.

## 2017-06-05 IMAGING — CT CT HEAD W/O CM
1 series · 16 of 29 positions shown, 20 images · non-contrast
Comparison: None.

CLINICAL DATA: Altered mental status.

EXAM:
CT HEAD WITHOUT CONTRAST
TECHNIQUE: Contiguous axial images were obtained from the base of the skull
through the vertex without intravenous contrast.

[Series 2: soft tissue · axial · 0.45mm/px · z∈[-176,-46]mm · 16 of 29 slices shown, 20 images]
[im 2/29  brain]
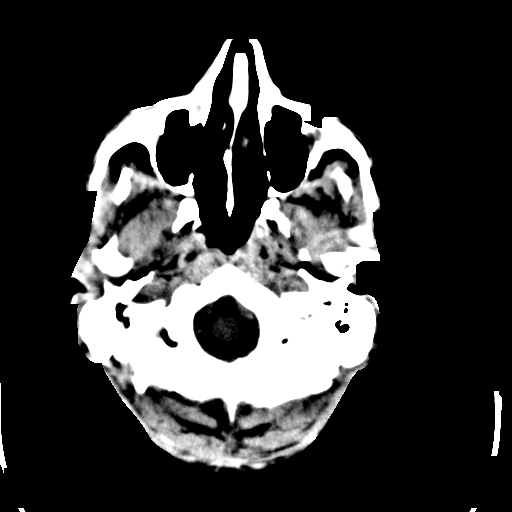
[im 2/29  bone]
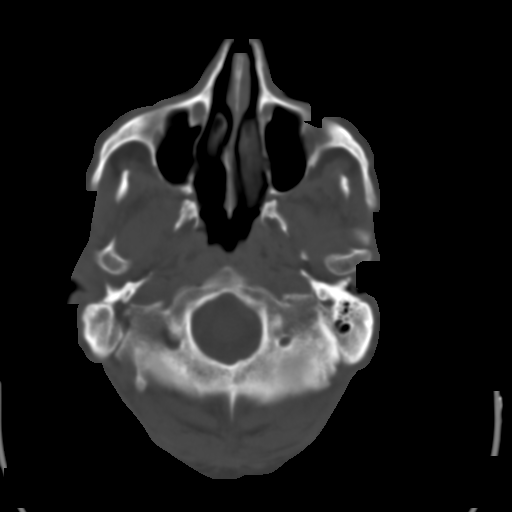
[im 4/29  brain]
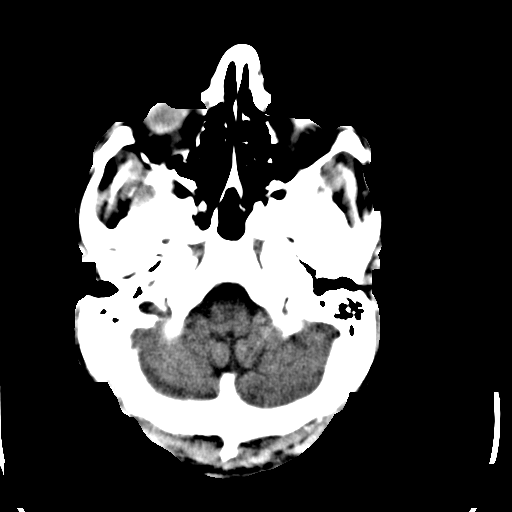
[im 6/29  brain]
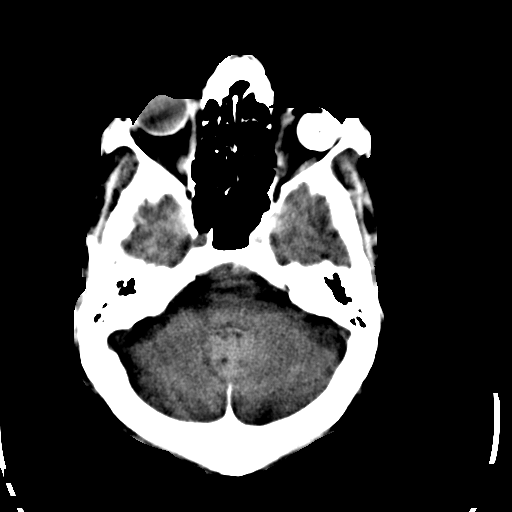
[im 7/29  brain]
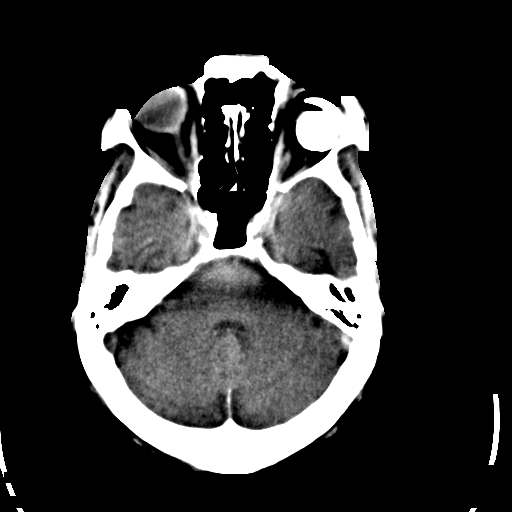
[im 9/29  brain]
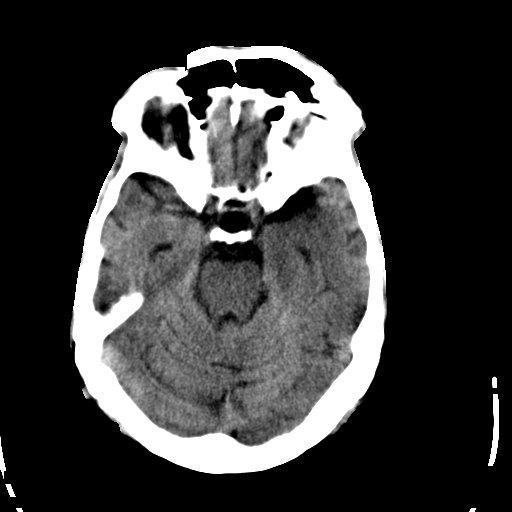
[im 9/29  bone]
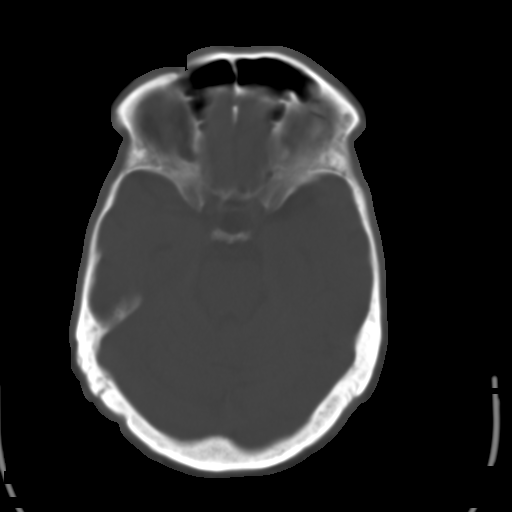
[im 11/29  brain]
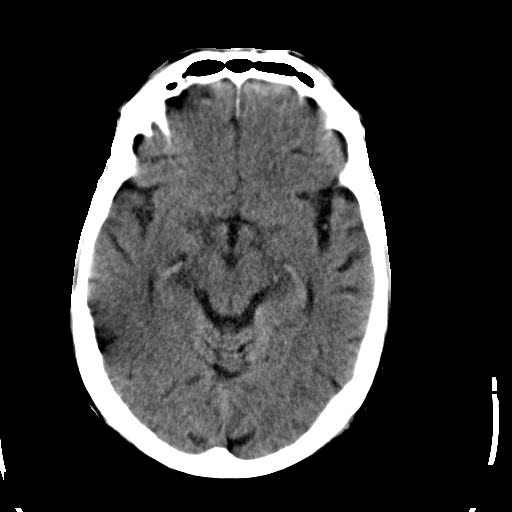
[im 12/29  brain]
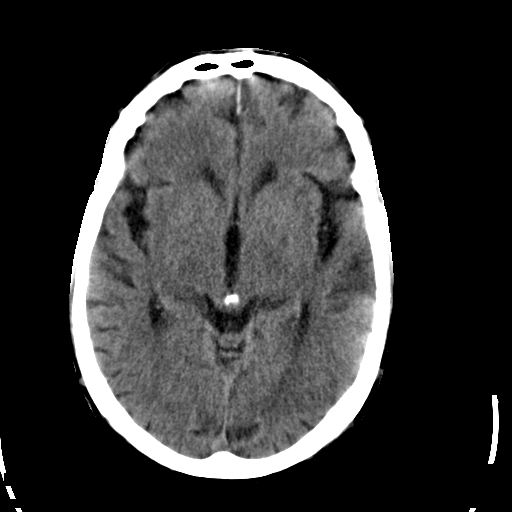
[im 14/29  brain]
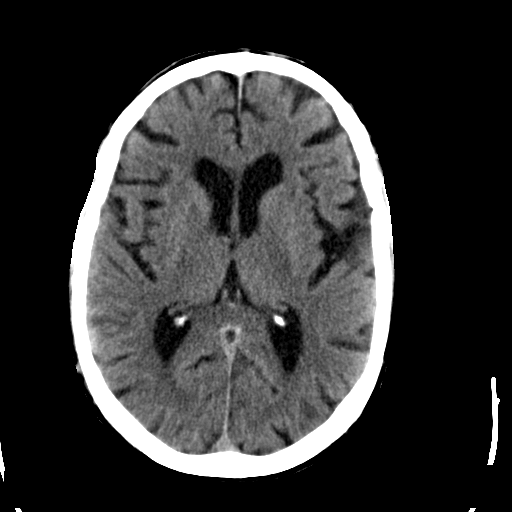
[im 16/29  brain]
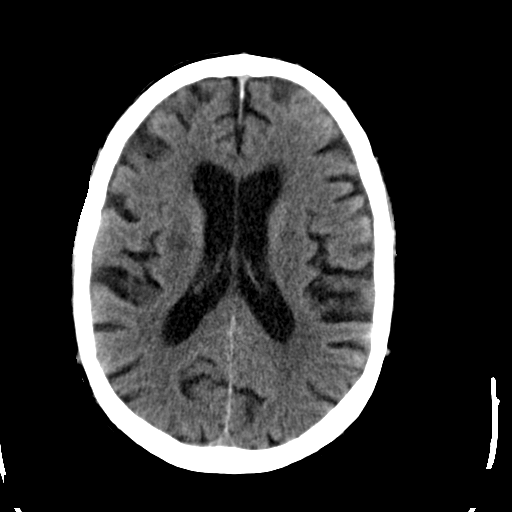
[im 16/29  bone]
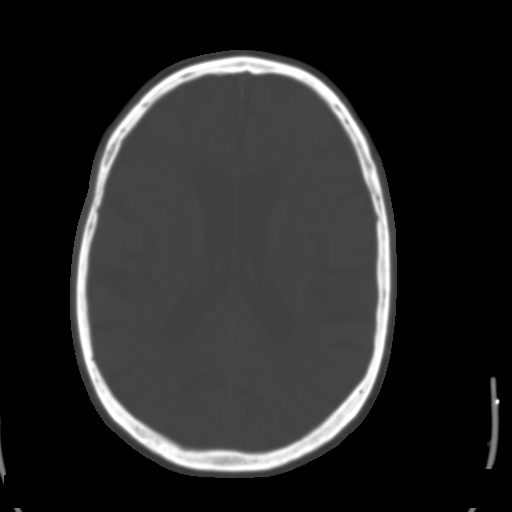
[im 18/29  brain]
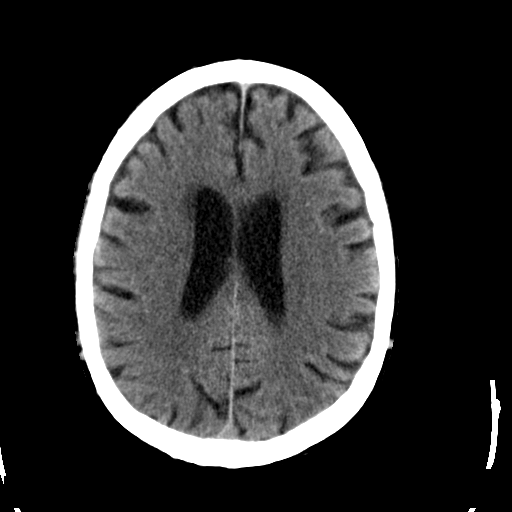
[im 19/29  brain]
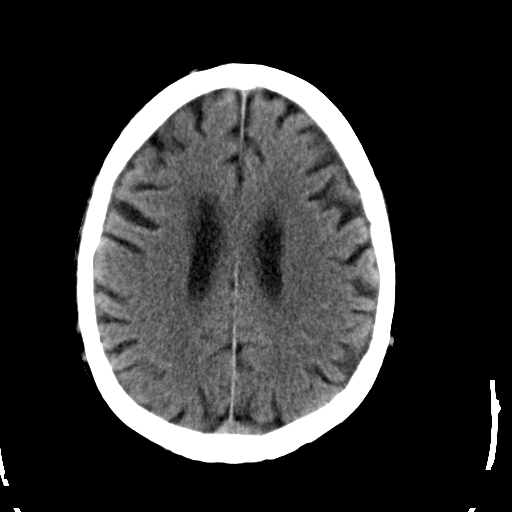
[im 21/29  brain]
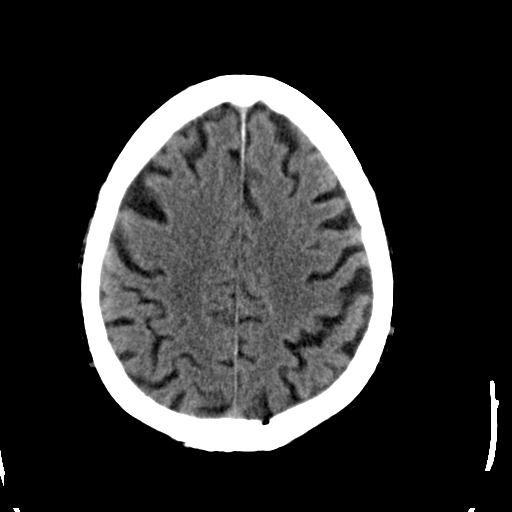
[im 23/29  brain]
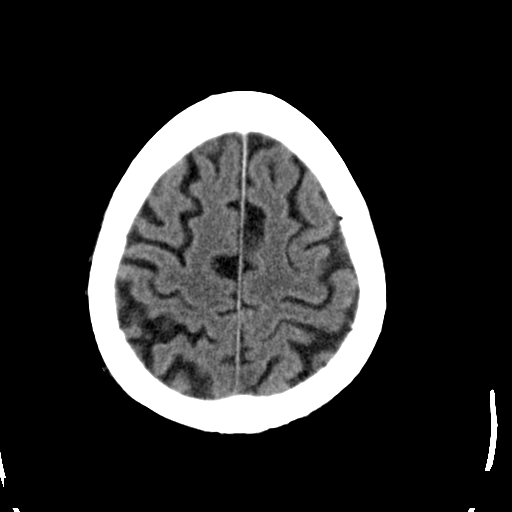
[im 23/29  bone]
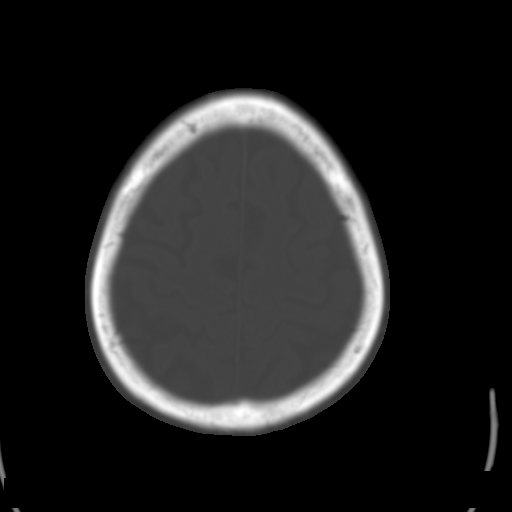
[im 24/29  brain]
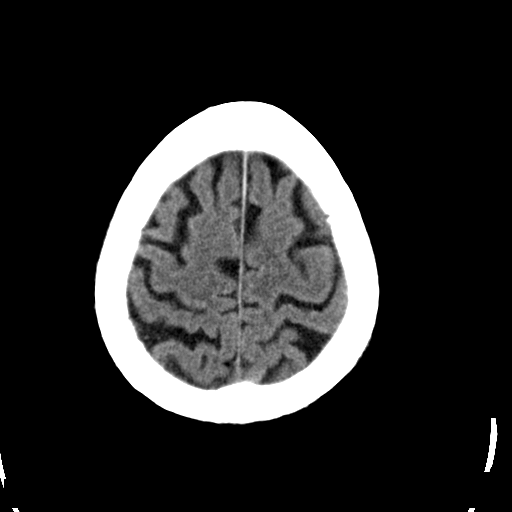
[im 26/29  brain]
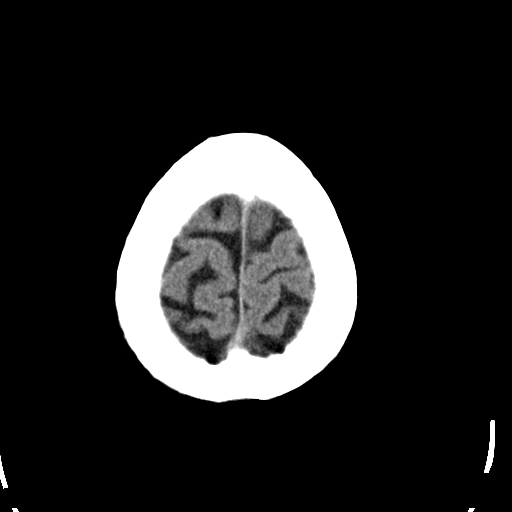
[im 28/29  brain]
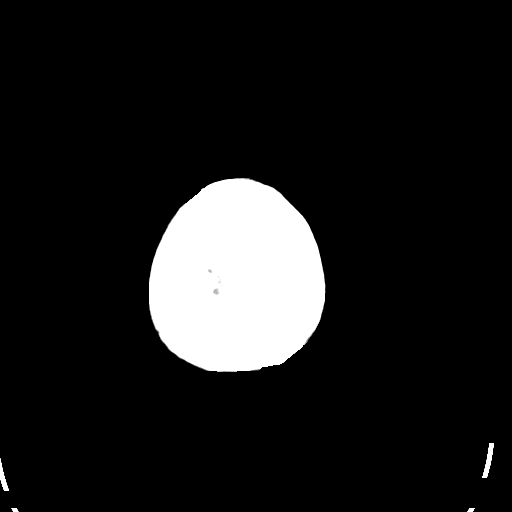

[16 of 29 positions shown; findings below may reference images not displayed]

FINDINGS: No mass effect or midline shift. No evidence of acute intracranial
hemorrhage, or infarction. No abnormal extra-axial fluid
collections. Gray-white matter differentiation is normal. Basal
cisterns are preserved. There is atrophy and chronic small vessel
disease changes.

No depressed skull fractures. Visualized paranasal sinuses and
mastoid air cells are not opacified. There is likely left orbital
prosthesis.
IMPRESSION: No acute intracranial abnormality.

Atrophy, chronic microvascular disease.
